# Patient Record
Sex: Female | Born: 1940 | Race: Black or African American | Hispanic: No | Smoking: Former smoker
Health system: Southern US, Community
[De-identification: ages and names within clinical notes are randomized; demographics above are authoritative.]

## PROBLEM LIST (undated history)

## (undated) DIAGNOSIS — H409 Unspecified glaucoma: Secondary | ICD-10-CM

## (undated) DIAGNOSIS — E559 Vitamin D deficiency, unspecified: Secondary | ICD-10-CM

## (undated) DIAGNOSIS — I10 Essential (primary) hypertension: Secondary | ICD-10-CM

## (undated) DIAGNOSIS — I4891 Unspecified atrial fibrillation: Secondary | ICD-10-CM

## (undated) HISTORY — DX: Vitamin D deficiency, unspecified: E55.9

## (undated) HISTORY — PX: CATARACT EXTRACTION: SUR2

## (undated) HISTORY — DX: Essential (primary) hypertension: I10

## (undated) HISTORY — PX: ABDOMINAL HYSTERECTOMY: SHX81

## (undated) HISTORY — PX: KIDNEY SURGERY: SHX687

## (undated) HISTORY — DX: Unspecified atrial fibrillation: I48.91

## (undated) HISTORY — PX: TONSILLECTOMY: SUR1361

---

## 1999-06-02 ENCOUNTER — Encounter: Payer: Self-pay | Admitting: *Deleted

## 1999-06-02 ENCOUNTER — Encounter: Admission: RE | Admit: 1999-06-02 | Discharge: 1999-06-02 | Payer: Self-pay | Admitting: *Deleted

## 2000-06-04 ENCOUNTER — Encounter: Payer: Self-pay | Admitting: *Deleted

## 2000-06-04 ENCOUNTER — Encounter: Admission: RE | Admit: 2000-06-04 | Discharge: 2000-06-04 | Payer: Self-pay | Admitting: *Deleted

## 2001-06-05 ENCOUNTER — Encounter: Payer: Self-pay | Admitting: *Deleted

## 2001-06-05 ENCOUNTER — Encounter: Admission: RE | Admit: 2001-06-05 | Discharge: 2001-06-05 | Payer: Self-pay | Admitting: *Deleted

## 2002-05-06 ENCOUNTER — Ambulatory Visit (HOSPITAL_COMMUNITY): Admission: RE | Admit: 2002-05-06 | Discharge: 2002-05-06 | Payer: Self-pay | Admitting: Gastroenterology

## 2002-06-08 ENCOUNTER — Encounter: Payer: Self-pay | Admitting: Internal Medicine

## 2002-06-08 ENCOUNTER — Encounter: Admission: RE | Admit: 2002-06-08 | Discharge: 2002-06-08 | Payer: Self-pay | Admitting: Internal Medicine

## 2003-06-10 ENCOUNTER — Encounter: Admission: RE | Admit: 2003-06-10 | Discharge: 2003-06-10 | Payer: Self-pay | Admitting: Internal Medicine

## 2004-07-04 ENCOUNTER — Encounter: Admission: RE | Admit: 2004-07-04 | Discharge: 2004-07-04 | Payer: Self-pay | Admitting: Internal Medicine

## 2005-07-16 ENCOUNTER — Encounter: Admission: RE | Admit: 2005-07-16 | Discharge: 2005-07-16 | Payer: Self-pay | Admitting: Internal Medicine

## 2006-07-17 ENCOUNTER — Encounter: Admission: RE | Admit: 2006-07-17 | Discharge: 2006-07-17 | Payer: Self-pay | Admitting: Internal Medicine

## 2007-07-21 ENCOUNTER — Encounter: Admission: RE | Admit: 2007-07-21 | Discharge: 2007-07-21 | Payer: Self-pay | Admitting: Internal Medicine

## 2007-07-29 ENCOUNTER — Encounter: Admission: RE | Admit: 2007-07-29 | Discharge: 2007-07-29 | Payer: Self-pay | Admitting: Internal Medicine

## 2007-12-23 ENCOUNTER — Inpatient Hospital Stay (HOSPITAL_COMMUNITY): Admission: EM | Admit: 2007-12-23 | Discharge: 2008-01-10 | Payer: Self-pay | Admitting: Emergency Medicine

## 2007-12-23 ENCOUNTER — Ambulatory Visit: Payer: Self-pay | Admitting: Pulmonary Disease

## 2007-12-29 ENCOUNTER — Encounter: Payer: Self-pay | Admitting: Pulmonary Disease

## 2008-03-08 ENCOUNTER — Encounter: Admission: RE | Admit: 2008-03-08 | Discharge: 2008-03-08 | Payer: Self-pay | Admitting: Neurosurgery

## 2008-07-21 ENCOUNTER — Encounter: Admission: RE | Admit: 2008-07-21 | Discharge: 2008-07-21 | Payer: Self-pay | Admitting: Internal Medicine

## 2009-07-27 ENCOUNTER — Encounter: Admission: RE | Admit: 2009-07-27 | Discharge: 2009-07-27 | Payer: Self-pay | Admitting: Internal Medicine

## 2010-07-08 ENCOUNTER — Other Ambulatory Visit: Payer: Self-pay | Admitting: Internal Medicine

## 2010-07-08 DIAGNOSIS — Z1231 Encounter for screening mammogram for malignant neoplasm of breast: Secondary | ICD-10-CM

## 2010-07-09 ENCOUNTER — Encounter: Payer: Self-pay | Admitting: Internal Medicine

## 2010-07-28 ENCOUNTER — Ambulatory Visit
Admission: RE | Admit: 2010-07-28 | Discharge: 2010-07-28 | Disposition: A | Discharge status: Home / Self Care | Payer: Federal, State, Local not specified - PPO | Source: Ambulatory Visit | Attending: Internal Medicine | Admitting: Internal Medicine

## 2010-07-28 DIAGNOSIS — Z1231 Encounter for screening mammogram for malignant neoplasm of breast: Secondary | ICD-10-CM

## 2010-10-31 NOTE — Consult Note (Signed)
NAME:  Kayla Calhoun, Kayla Calhoun NO.:  [id number]   MEDICAL RECORD NO.:  [id number]          PATIENT TYPE:  INP   LOCATION:  3037                         FACILITY:  [facility]   PHYSICIAN:  Lonia Blood, M.D.       DATE OF BIRTH:  1940-07-29   DATE OF CONSULTATION:  DATE OF DISCHARGE:                                 CONSULTATION   PRIMARY CARE PHYSICIAN:  Merlene Laughter. Renae Gloss, MD   REQUESTING PHYSICIAN FOR THE CONSULTATION:  Charlaine Dalton. Wert, MD, FCCP   REASON FOR CONSULTATION:  Atrial fibrillation, pulmonary edema, and  respiratory failure.   HISTORY OF PRESENT ILLNESS:  Kayla Calhoun is a 70 year old African-  American woman with past medical history of hypertension who was  admitted on December 23, 2007, with a subarachnoid hemorrhage.  The patient  underwent a four-vessel cerebral arteriogram, which did not indicate  presence of any aneurysm.  The patient was placed on intravenous fluids  and Nimotop to prevent vasospasm.  She developed respiratory failure and  she was placed in intensive care unit.  She became febrile and she had  the urine culture that grew Escherichia coli.  She was treated for  possible hospital-acquired pneumonia as well as volume overload and she  did gradually improve.  She also suffered an episode of atrial  fibrillation, for which she was loaded with amiodarone.  The patient  required endotracheal tube placement and mechanical ventilation starting  on December 30, 2007.  She was extubated on January 04, 2008, and since then,  she has been observed in the intensive care unit without any new events.  She was transferred to regular floor on January 07, 2008, and Dr. Sandrea Hughs contacted [place] this morning to assume her medical care.  Currently,  [name] denies any headaches, denies shortness of breath, and  denies any chest pain.  She is undergoing physical therapy and  occupational therapy for her post intensive care unit stay.   PAST MEDICAL HISTORY:  1.  Hypertension.  2. Hysterectomy.  3. Appendectomy.  4. Tonsillectomy.   CURRENT MEDICATIONS:  Nimotop, Zebeta, Betagan eye drops, Alphagan eye  drops, Travatan eye drops, Lantus 10 units twice a day, and NovoLog  sliding scale every 4 hours.  The patient was also on Solu-Medrol  intravenously until January 07, 2008.   SOCIAL HISTORY:  The patient lives alone and is completely independent  prior to this event.  She does not smoke cigarettes.  Does not drink  alcohol.  She is retired.   ALLERGIES:  No known drug allergies.   REVIEW OF SYSTEMS:  As per the HPI, also positive for this generalized  weakness, post acute ICU stay.   PHYSICAL EXAMINATION:  VITAL SIGNS:  Temperature is 98.7, heart rate 53,  respiratory rate 18, blood pressure 145/83, and saturation of 97% on  room air.  Morning CBG is 81.  HEENT:  The patient's head appears normocephalic and atraumatic.  Eyes,  pupils equal, round, and reactive to light and accommodation.  Extraocular movement is intact.  Throat clear.  NECK:  Supple.  There is a left  IJ catheter in place.  CHEST:  Clear to auscultation in the anterior fields.  HEART:  Regular, 3/6 systolic murmur in the left third intercostal  space.  ABDOMEN:  Soft and nontender.  Bowel sounds are present.  EXTREMITIES:  Lower extremities without edema.  SKIN:  Warm and dry without any suspicious-looking rashes.   LABORATORY VALUES:  Sodium of 141, potassium 3.9, chloride 100,  bicarbonate 33, BUN 20, creatinine 0.5, and glucose of 80.  White blood  cell count is 9, hemoglobin 9.5, and platelet count is 571.   Portable chest x-ray indicates improvement of diffuse airspace disease  and cardiomegaly.   IMPRESSION AND RECOMMENDATION:  1. Subarachnoid hemorrhage.  This will be managed by the primary      service.  The patient's head CT that was repeated on January 06, 2008,      indicates a complete resolution of the convexity of subarachnoid      hemorrhage and decrease in  the amount of the intraventricular      blood.  2. Respiratory failure.  This seems to have resolved nicely.  Ms.      Dupriest does not seem to be in any acute respiratory distress      currently. We will check saturations with ambulation and treat as      necessary. A follow up CXR has been ordered.  3. 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  A transthoracic echocardiogram      was obtained on December 29, 2007, indicated a preserved ejection      fraction, but with significant left ventricular hypertrophy, and      the Doppler evidence for mid dynamic left ventricular outflow tract      obstruction with a peak gradient of 27 mmHg.  I will try to stay      away from any direct vasodilators and to control the patient's      heart rate as much as possible.  4. Paroxysmal atrial fibrillation that was treated with amiodarone in      the intensive care unit.  The patient's amiodarone has been      discontinued yesterday by Dr. Sandrea Hughs.  We will keep the      patient on telemetry and monitor closely her heart rate.  5. Hypoglycemia, while the patient was in intensive care unit.  She      received steroids and tube feeds through a Panda tube.  She does      not carry a history of diabetes, but indeed she has obesity and      hypertension.  I will check a hemoglobin A1c and titrate down the      insulin aggressively as the steroids were discontinued yesterday      and the CBGs were trending down.  6. Deep vein thrombosis prophylaxis will be done using PAS hoses.      Lonia Blood, M.D.  Electronically Signed     SL/MEDQ  D:  01/08/2008  T:  01/08/2008  Job:  [id number]   cc:   Charlaine Dalton. Sherene Sires, MD, [facility]  Merlene Laughter. Renae Gloss, M.D.

## 2010-11-03 NOTE — Discharge Summary (Signed)
NAME:  KWANZA, CANCELLIERE            ACCOUNT NO.:  [id number]   MEDICAL RECORD NO.:  [id number]          PATIENT TYPE:  INP   LOCATION:  3037                         FACILITY:  [facility]   PHYSICIAN:  Reinaldo Meeker, M.D. DATE OF BIRTH:  Oct 19, 1940   DATE OF ADMISSION:  12/23/2007  DATE OF DISCHARGE:  01/10/2008                               DISCHARGE SUMMARY   PRIMARY DIAGNOSIS:  Subarachnoid hemorrhage.   PRIMARY OPERATIVE PROCEDURE:  Arteriography.   HISTORY:  Kayla Calhoun is a 70 year old female who was in her usual  state of health until the day of admission when she had a sudden onset  of severe headache.  She went to [facility] Emergency Room Department  where she underwent a CT scan of the brain, which was read as a  subarachnoid hemorrhage.  On our evaluation, it was noted to be mostly  intraventricular with no evidence of hydrocephalus.  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  The patient was therefore admitted at the ICU for  hypervolemic therapy along with observation.  She was treated with  aggressive fluid treatment and never developed any evidence of  vasospasm.  Subsequent scans showed no evidence of hydrocephalus.  She  did get some fluid overload and Critical Care Medicine got involved in  her care.  She actually had to be intubated for approximately 5 days  during which time she showed no worsening neurologically.  When she was  eventually able to be extubated, she improved quickly neurologically and  was actually able to begin increase in her activities.  She was  subsequently transferred to the floor where physical therapy and  occupational therapy began to work with her.  By January 06, 2008, she was  awake, alert, and appropriate and followed complex commands.  A brain  scan at that time showed complete resolution of her hemorrhage and no  hydrocephalus.  She was subsequently advanced on her activity  levels.  On January 10, 2008, she was sitting up comfortably, ambulating without  difficulty, and was felt that she will be discharged home.   Discharge medications include some pain medication with routine  medications.   Her condition was markedly improved versus admission.           ______________________________  Reinaldo Meeker, M.D.     ROK/MEDQ  D:  02/05/2008  T:  02/06/2008  Job:  [id number]

## 2010-11-03 NOTE — Op Note (Signed)
   NAME:  Kayla Calhoun, Kayla Calhoun                      ACCOUNT NO.:  [id number]   MEDICAL RECORD NO.:  [id number]                   PATIENT TYPE:  AMB   LOCATION:  ENDO                                 FACILITY:  [facility]   PHYSICIAN:  Anselmo Rod, M.D.               DATE OF BIRTH:  01/14/1941   DATE OF PROCEDURE:  05/06/2002  DATE OF DISCHARGE:                                 OPERATIVE REPORT   PROCEDURE:  Colonoscopy, endoscopy.   ENDOSCOPIST:  Anselmo Rod, M.D.   INSTRUMENT:  Olympus video colonoscope (adjustable pediatric scope).   INDICATIONS FOR PROCEDURE:  Rectal bleeding in a 69 year old African-  American female. To rule out any colonic polyps, masses, hemorrhoids, etc.   PRE-PROCEDURE PREPARATION:  Informed consent was procured from the patient.  Patient fasted for 8 hours prior to the procedure and prepped with a bottle  of magnesium citrate and a gallon of NuLytely the night prior to the  procedure.  Pre-procedure physical:  Patient had stable vital signs, neck  supple, chest clear to auscultation, respirations regular, abdomen soft with  normal bowel sounds.   DESCRIPTION OF PROCEDURE:  The patient was placed in the left lateral  decubitus position, sedated with 100 mg of Demerol and 10 mg of Versed  intravenously.  Once the patient was adequately sedated and maintained on  low flow oxygen, continuous cardiac monitoring; the Olympus video  colonoscope was advanced from the rectum to the cecum with difficulty. There  was a large amount of residual stool in the colon. Multiple washings were  done.  The entire colonic mucosa up to the terminal ileum appeared healthy  except for scattered diverticulosis. There were some very large diverticula  seen in the right colon, small lesions could have been missed.  No large  masses or polyps were seen.   IMPRESSION:  1. Scattered diverticulosis.  2. Large amount of residual stool in the colon.  Small lesions could have     been  missed.  3. No masses or polyps seen.    RECOMMENDATIONS:  1. A high fiber diet has been recommended to the patient.  2. Outpatient follow up in the next 2 weeks with further recommendations.                                                   Anselmo Rod, M.D.    JNM/MEDQ  D:  05/06/2002  T:  05/06/2002  Job:  [id number]   cc:   Merlene Laughter. Renae Gloss, M.D.

## 2011-03-15 LAB — BLOOD GAS, ARTERIAL
Acid-Base Excess: 0.7
Acid-Base Excess: 2.2 — ABNORMAL HIGH
Acid-Base Excess: 3.5 — ABNORMAL HIGH
Acid-Base Excess: 5 — ABNORMAL HIGH
Acid-Base Excess: 5.1 — ABNORMAL HIGH
Bicarbonate: 24.4 — ABNORMAL HIGH
Bicarbonate: 25.9 — ABNORMAL HIGH
Bicarbonate: 27.1 — ABNORMAL HIGH
Bicarbonate: 27.7 — ABNORMAL HIGH
Bicarbonate: 27.9 — ABNORMAL HIGH
Delivery systems: POSITIVE
Delivery systems: POSITIVE
Drawn by: 129801
Drawn by: 27733
Drawn by: 27733
Drawn by: 29943
FIO2: 0.5
FIO2: 0.6
FIO2: 1
FIO2: 1
FIO2: 1
MECHVT: 400
O2 Saturation: 95
O2 Saturation: 95.5
O2 Saturation: 96
O2 Saturation: 98.5
O2 Saturation: 98.8
PEEP: 5
PEEP: 8
PEEP: 8
Patient temperature: 100
Patient temperature: 103.1
Patient temperature: 98.6
Patient temperature: 98.6
Patient temperature: 99.1
Pressure support: 14
Pressure support: 14
RATE: 20
TCO2: 25.5
TCO2: 27.1
TCO2: 28.3
TCO2: 28.7
TCO2: 28.9
pCO2 arterial: 33.2 — ABNORMAL LOW
pCO2 arterial: 36.2
pCO2 arterial: 36.9
pCO2 arterial: 38.6
pCO2 arterial: 39.1
pH, Arterial: 7.438 — ABNORMAL HIGH
pH, Arterial: 7.443 — ABNORMAL HIGH
pH, Arterial: 7.46 — ABNORMAL HIGH
pH, Arterial: 7.501 — ABNORMAL HIGH
pH, Arterial: 7.535 — ABNORMAL HIGH
pO2, Arterial: 109 — ABNORMAL HIGH
pO2, Arterial: 152 — ABNORMAL HIGH
pO2, Arterial: 71.1 — ABNORMAL LOW
pO2, Arterial: 74.2 — ABNORMAL LOW
pO2, Arterial: 79.9 — ABNORMAL LOW

## 2011-03-15 LAB — BASIC METABOLIC PANEL
BUN: 10
BUN: 12
BUN: 16
BUN: 18
BUN: 7
CO2: 23
CO2: 26
CO2: 26
CO2: 27
CO2: 27
Calcium: 7 — ABNORMAL LOW
Calcium: 7.1 — ABNORMAL LOW
Calcium: 7.2 — ABNORMAL LOW
Calcium: 9.2
Calcium: 9.3
Chloride: 100
Chloride: 100
Chloride: 102
Chloride: 98
Chloride: 99
Creatinine, Ser: 0.62
Creatinine, Ser: 0.68
Creatinine, Ser: 0.69
Creatinine, Ser: 0.72
Creatinine, Ser: 0.81
GFR calc Af Amer: >60
GFR calc Af Amer: >60
GFR calc Af Amer: >60
GFR calc Af Amer: >60
GFR calc Af Amer: >60
GFR calc non Af Amer: >60
GFR calc non Af Amer: >60
GFR calc non Af Amer: >60
GFR calc non Af Amer: >60
GFR calc non Af Amer: >60
Glucose, Bld: 108 — ABNORMAL HIGH
Glucose, Bld: 134 — ABNORMAL HIGH
Glucose, Bld: 156 — ABNORMAL HIGH
Glucose, Bld: 176 — ABNORMAL HIGH
Glucose, Bld: 180 — ABNORMAL HIGH
Potassium: 3.3 — ABNORMAL LOW
Potassium: 3.4 — ABNORMAL LOW
Potassium: 3.5
Potassium: 3.7
Potassium: 6 — ABNORMAL HIGH
Sodium: 130 — ABNORMAL LOW
Sodium: 131 — ABNORMAL LOW
Sodium: 132 — ABNORMAL LOW
Sodium: 136
Sodium: 137

## 2011-03-15 LAB — CBC
HCT: 28.3 — ABNORMAL LOW
HCT: 30.2 — ABNORMAL LOW
HCT: 32.4 — ABNORMAL LOW
HCT: 39.8
HCT: 41.1
Hemoglobin: 10 — ABNORMAL LOW
Hemoglobin: 10.8 — ABNORMAL LOW
Hemoglobin: 13.6
Hemoglobin: 13.6
Hemoglobin: 9.8 — ABNORMAL LOW
MCHC: 33.1
MCHC: 33.1
MCHC: 33.4
MCHC: 34
MCHC: 34.5
MCV: 87.4
MCV: 87.5
MCV: 87.9
MCV: 88.1
MCV: 89.1
Platelets: 221
Platelets: 235
Platelets: 245
Platelets: 283
Platelets: 305
RBC: 3.22 — ABNORMAL LOW
RBC: 3.39 — ABNORMAL LOW
RBC: 3.68 — ABNORMAL LOW
RBC: 4.56
RBC: 4.69
RDW: 14.6
RDW: 14.7
RDW: 15.1
RDW: 15.3
RDW: 16 — ABNORMAL HIGH
WBC: 11.3 — ABNORMAL HIGH
WBC: 12.5 — ABNORMAL HIGH
WBC: 14.3 — ABNORMAL HIGH
WBC: 15.8 — ABNORMAL HIGH
WBC: 9.2

## 2011-03-15 LAB — DIFFERENTIAL
Basophils Absolute: 0
Basophils Absolute: 0
Basophils Relative: 0
Basophils Relative: 0
Eosinophils Absolute: 0
Eosinophils Absolute: 0.1
Eosinophils Relative: 0
Eosinophils Relative: 1
Lymphocytes Relative: 11 — ABNORMAL LOW
Lymphocytes Relative: 23
Lymphs Abs: 1.4
Lymphs Abs: 2.1
Monocytes Absolute: 0.3
Monocytes Absolute: 0.5
Monocytes Relative: 3
Monocytes Relative: 4
Neutro Abs: 10.6 — ABNORMAL HIGH
Neutro Abs: 6.7
Neutrophils Relative %: 73
Neutrophils Relative %: 85 — ABNORMAL HIGH

## 2011-03-15 LAB — COMPREHENSIVE METABOLIC PANEL
ALT: 17
AST: 19
Albumin: 2 — ABNORMAL LOW
Alkaline Phosphatase: 50
BUN: 16
CO2: 26
Calcium: 7.6 — ABNORMAL LOW
Chloride: 102
Creatinine, Ser: 0.68
GFR calc Af Amer: >60
GFR calc non Af Amer: >60
Glucose, Bld: 98
Potassium: 3.5
Sodium: 136
Total Bilirubin: 1.5 — ABNORMAL HIGH
Total Protein: 4.1 — ABNORMAL LOW

## 2011-03-15 LAB — CARDIAC PANEL(CRET KIN+CKTOT+MB+TROPI)
CK, MB: 0.5
CK, MB: 1
CK, MB: 1.2
CK, MB: 1.2
Relative Index: 0.9
Relative Index: INVALID
Relative Index: INVALID
Relative Index: INVALID
Total CK: 132
Total CK: 34
Total CK: 45
Total CK: 58
Troponin I: 0.01
Troponin I: 0.03
Troponin I: 0.04
Troponin I: 0.07 — ABNORMAL HIGH

## 2011-03-15 LAB — URINALYSIS, ROUTINE W REFLEX MICROSCOPIC
Bilirubin Urine: NEGATIVE
Glucose, UA: NEGATIVE
Hgb urine dipstick: NEGATIVE
Ketones, ur: NEGATIVE
Leukocytes, UA: NEGATIVE
Nitrite: NEGATIVE
Protein, ur: 100 — AB
Specific Gravity, Urine: 1.012
Urobilinogen, UA: 0.2
pH: 7.5

## 2011-03-15 LAB — URINE CULTURE
Colony Count: >=100000
Special Requests: NEGATIVE

## 2011-03-15 LAB — CULTURE, BLOOD (ROUTINE X 2)
Culture: NO GROWTH
Culture: NO GROWTH

## 2011-03-15 LAB — HEPATIC FUNCTION PANEL
ALT: 18
AST: 19
Albumin: 2.1 — ABNORMAL LOW
Alkaline Phosphatase: 41
Bilirubin, Direct: 0.4 — ABNORMAL HIGH
Indirect Bilirubin: 0.6
Total Bilirubin: 1
Total Protein: 4.1 — ABNORMAL LOW

## 2011-03-15 LAB — CULTURE, RESPIRATORY W GRAM STAIN: Culture: NORMAL

## 2011-03-15 LAB — PROTIME-INR
INR: 0.9
Prothrombin Time: 12.6

## 2011-03-15 LAB — URINE MICROSCOPIC-ADD ON

## 2011-03-15 LAB — B-NATRIURETIC PEPTIDE (CONVERTED LAB): Pro B Natriuretic peptide (BNP): 202 — ABNORMAL HIGH

## 2011-03-15 LAB — EXPECTORATED SPUTUM ASSESSMENT W GRAM STAIN, RFLX TO RESP C

## 2011-03-15 LAB — MAGNESIUM: Magnesium: 1.9

## 2011-03-15 LAB — APTT: aPTT: 24

## 2011-03-15 LAB — CULTURE, RESPIRATORY

## 2011-03-15 LAB — EXPECTORATED SPUTUM ASSESSMENT W REFEX TO RESP CULTURE

## 2011-03-15 LAB — SEDIMENTATION RATE: Sed Rate: 90 — ABNORMAL HIGH

## 2011-03-16 LAB — CBC
HCT: 26.2 — ABNORMAL LOW
HCT: 27.6 — ABNORMAL LOW
HCT: 27.8 — ABNORMAL LOW
HCT: 28.5 — ABNORMAL LOW
HCT: 28.9 — ABNORMAL LOW
HCT: 29 — ABNORMAL LOW
HCT: 29.3 — ABNORMAL LOW
HCT: 29.4 — ABNORMAL LOW
HCT: 29.5 — ABNORMAL LOW
Hemoglobin: 10 — ABNORMAL LOW
Hemoglobin: 8.9 — ABNORMAL LOW
Hemoglobin: 9.2 — ABNORMAL LOW
Hemoglobin: 9.3 — ABNORMAL LOW
Hemoglobin: 9.4 — ABNORMAL LOW
Hemoglobin: 9.5 — ABNORMAL LOW
Hemoglobin: 9.5 — ABNORMAL LOW
Hemoglobin: 9.7 — ABNORMAL LOW
Hemoglobin: 9.7 — ABNORMAL LOW
MCHC: 32.1
MCHC: 32.4
MCHC: 33.1
MCHC: 33.3
MCHC: 33.3
MCHC: 33.3
MCHC: 33.6
MCHC: 34.1
MCHC: 34.2
MCV: 87.9
MCV: 88
MCV: 88
MCV: 88.1
MCV: 88.4
MCV: 88.5
MCV: 89
MCV: 89.1
MCV: 89.4
Platelets: 295
Platelets: 378
Platelets: 379
Platelets: 391
Platelets: 474 — ABNORMAL HIGH
Platelets: 513 — ABNORMAL HIGH
Platelets: 517 — ABNORMAL HIGH
Platelets: 543 — ABNORMAL HIGH
Platelets: 571 — ABNORMAL HIGH
RBC: 2.96 — ABNORMAL LOW
RBC: 3.14 — ABNORMAL LOW
RBC: 3.16 — ABNORMAL LOW
RBC: 3.23 — ABNORMAL LOW
RBC: 3.25 — ABNORMAL LOW
RBC: 3.27 — ABNORMAL LOW
RBC: 3.3 — ABNORMAL LOW
RBC: 3.3 — ABNORMAL LOW
RBC: 3.33 — ABNORMAL LOW
RDW: 14.7
RDW: 15
RDW: 15
RDW: 15.1
RDW: 15.3
RDW: 15.3
RDW: 15.4
RDW: 15.5
RDW: 15.6 — ABNORMAL HIGH
WBC: 11 — ABNORMAL HIGH
WBC: 11.6 — ABNORMAL HIGH
WBC: 13.1 — ABNORMAL HIGH
WBC: 13.9 — ABNORMAL HIGH
WBC: 13.9 — ABNORMAL HIGH
WBC: 14.3 — ABNORMAL HIGH
WBC: 17.1 — ABNORMAL HIGH
WBC: 7.9
WBC: 9

## 2011-03-16 LAB — BASIC METABOLIC PANEL
BUN: 10
BUN: 20
BUN: 24 — ABNORMAL HIGH
BUN: 29 — ABNORMAL HIGH
BUN: 9
BUN: 9
CO2: 29
CO2: 29
CO2: 31
CO2: 33 — ABNORMAL HIGH
CO2: 33 — ABNORMAL HIGH
CO2: 35 — ABNORMAL HIGH
Calcium: 7.7 — ABNORMAL LOW
Calcium: 7.8 — ABNORMAL LOW
Calcium: 8.1 — ABNORMAL LOW
Calcium: 8.6
Calcium: 8.8
Calcium: 9
Chloride: 100
Chloride: 101
Chloride: 101
Chloride: 101
Chloride: 103
Chloride: 99
Creatinine, Ser: 0.43
Creatinine, Ser: 0.5
Creatinine, Ser: 0.53
Creatinine, Ser: 0.55
Creatinine, Ser: 0.56
Creatinine, Ser: 0.59
GFR calc Af Amer: >60
GFR calc Af Amer: >60
GFR calc Af Amer: >60
GFR calc Af Amer: >60
GFR calc Af Amer: >60
GFR calc Af Amer: >60
GFR calc non Af Amer: >60
GFR calc non Af Amer: >60
GFR calc non Af Amer: >60
GFR calc non Af Amer: >60
GFR calc non Af Amer: >60
GFR calc non Af Amer: >60
Glucose, Bld: 121 — ABNORMAL HIGH
Glucose, Bld: 124 — ABNORMAL HIGH
Glucose, Bld: 131 — ABNORMAL HIGH
Glucose, Bld: 152 — ABNORMAL HIGH
Glucose, Bld: 173 — ABNORMAL HIGH
Glucose, Bld: 80
Potassium: 3.1 — ABNORMAL LOW
Potassium: 3.3 — ABNORMAL LOW
Potassium: 3.4 — ABNORMAL LOW
Potassium: 3.8
Potassium: 3.9
Potassium: 4.2
Sodium: 136
Sodium: 138
Sodium: 138
Sodium: 138
Sodium: 141
Sodium: 142

## 2011-03-16 LAB — BLOOD GAS, ARTERIAL
Acid-Base Excess: 0.9
Acid-Base Excess: 10.4 — ABNORMAL HIGH
Acid-Base Excess: 4.1 — ABNORMAL HIGH
Acid-Base Excess: 4.6 — ABNORMAL HIGH
Acid-Base Excess: 6.2 — ABNORMAL HIGH
Acid-Base Excess: 8.2 — ABNORMAL HIGH
Bicarbonate: 24.9 — ABNORMAL HIGH
Bicarbonate: 28.4 — ABNORMAL HIGH
Bicarbonate: 29.7 — ABNORMAL HIGH
Bicarbonate: 30.5 — ABNORMAL HIGH
Bicarbonate: 32.4 — ABNORMAL HIGH
Bicarbonate: 34.7 — ABNORMAL HIGH
Delivery systems: POSITIVE
Drawn by: 24486
Drawn by: 30599
FIO2: 0.28
FIO2: 0.4
FIO2: 0.4
FIO2: 0.5
FIO2: 0.6
FIO2: 100
MECHVT: 300
MECHVT: 300
MECHVT: 300
Mode: POSITIVE
O2 Saturation: 87.4
O2 Saturation: 91.9
O2 Saturation: 92.9
O2 Saturation: 94.3
O2 Saturation: 96.5
O2 Saturation: 97.7
PEEP: 5
PEEP: 5
PEEP: 5
PEEP: 6
PEEP: 8
Patient temperature: 100.3
Patient temperature: 98.6
Patient temperature: 98.6
Patient temperature: 98.9
Patient temperature: 99.7
Patient temperature: 99.8
Pressure support: 12
Pressure support: 6
RATE: 24
RATE: 24
RATE: 24
TCO2: 26.1
TCO2: 29.7
TCO2: 31.3
TCO2: 31.9
TCO2: 33.8
TCO2: 36.2
pCO2 arterial: 41.1
pCO2 arterial: 44.6
pCO2 arterial: 48 — ABNORMAL HIGH
pCO2 arterial: 48.1 — ABNORMAL HIGH
pCO2 arterial: 48.7 — ABNORMAL HIGH
pCO2 arterial: 53.4 — ABNORMAL HIGH
pH, Arterial: 7.364
pH, Arterial: 7.406 — ABNORMAL HIGH
pH, Arterial: 7.42 — ABNORMAL HIGH
pH, Arterial: 7.422 — ABNORMAL HIGH
pH, Arterial: 7.447 — ABNORMAL HIGH
pH, Arterial: 7.466 — ABNORMAL HIGH
pO2, Arterial: 53.6 — ABNORMAL LOW
pO2, Arterial: 66.3 — ABNORMAL LOW
pO2, Arterial: 67.6 — ABNORMAL LOW
pO2, Arterial: 76.1 — ABNORMAL LOW
pO2, Arterial: 88.1
pO2, Arterial: 95.8

## 2011-03-16 LAB — COMPREHENSIVE METABOLIC PANEL
ALT: 20
ALT: 35
ALT: 37 — ABNORMAL HIGH
ALT: 48 — ABNORMAL HIGH
AST: 20
AST: 22
AST: 25
AST: 26
Albumin: 1.9 — ABNORMAL LOW
Albumin: 2 — ABNORMAL LOW
Albumin: 2.3 — ABNORMAL LOW
Albumin: 2.3 — ABNORMAL LOW
Alkaline Phosphatase: 53
Alkaline Phosphatase: 54
Alkaline Phosphatase: 56
Alkaline Phosphatase: 61
BUN: 11
BUN: 14
BUN: 15
BUN: 17
CO2: 25
CO2: 27
CO2: 31
CO2: 31
Calcium: 7.3 — ABNORMAL LOW
Calcium: 7.5 — ABNORMAL LOW
Calcium: 8.7
Calcium: 8.8
Chloride: 100
Chloride: 100
Chloride: 106
Chloride: 108
Creatinine, Ser: 0.51
Creatinine, Ser: 0.56
Creatinine, Ser: 0.57
Creatinine, Ser: 0.61
GFR calc Af Amer: >60
GFR calc Af Amer: >60
GFR calc Af Amer: >60
GFR calc Af Amer: >60
GFR calc non Af Amer: >60
GFR calc non Af Amer: >60
GFR calc non Af Amer: >60
GFR calc non Af Amer: >60
Glucose, Bld: 102 — ABNORMAL HIGH
Glucose, Bld: 102 — ABNORMAL HIGH
Glucose, Bld: 123 — ABNORMAL HIGH
Glucose, Bld: 193 — ABNORMAL HIGH
Potassium: 3.2 — ABNORMAL LOW
Potassium: 3.6
Potassium: 3.9
Potassium: 3.9
Sodium: 137
Sodium: 138
Sodium: 139
Sodium: 140
Total Bilirubin: 0.6
Total Bilirubin: 0.6
Total Bilirubin: 0.9
Total Bilirubin: 1
Total Protein: 4.4 — ABNORMAL LOW
Total Protein: 4.5 — ABNORMAL LOW
Total Protein: 4.9 — ABNORMAL LOW
Total Protein: 5.4 — ABNORMAL LOW

## 2011-03-16 LAB — RETICULOCYTES
RBC.: 3.42 — ABNORMAL LOW
Retic Count, Absolute: 41
Retic Ct Pct: 1.2

## 2011-03-16 LAB — LACTIC ACID, PLASMA: Lactic Acid, Venous: 1

## 2011-03-16 LAB — SEDIMENTATION RATE
Sed Rate: 48 — ABNORMAL HIGH
Sed Rate: 50 — ABNORMAL HIGH
Sed Rate: 65 — ABNORMAL HIGH
Sed Rate: 66 — ABNORMAL HIGH

## 2011-03-16 LAB — IRON AND TIBC
Iron: 21 — ABNORMAL LOW
Saturation Ratios: 13 — ABNORMAL LOW
TIBC: 159 — ABNORMAL LOW
UIBC: 138

## 2011-03-16 LAB — VITAMIN B12: Vitamin B-12: 1316 — ABNORMAL HIGH (ref 211–911)

## 2011-03-16 LAB — B-NATRIURETIC PEPTIDE (CONVERTED LAB)
Pro B Natriuretic peptide (BNP): 183 — ABNORMAL HIGH
Pro B Natriuretic peptide (BNP): 272 — ABNORMAL HIGH
Pro B Natriuretic peptide (BNP): 330 — ABNORMAL HIGH

## 2011-03-16 LAB — HEMOGLOBIN A1C
Hgb A1c MFr Bld: 6.5 — ABNORMAL HIGH
Mean Plasma Glucose: 154

## 2011-03-16 LAB — ANA: Anti Nuclear Antibody(ANA): NEGATIVE

## 2011-03-16 LAB — RHEUMATOID FACTOR: Rhuematoid fact SerPl-aCnc: <20

## 2011-03-16 LAB — FERRITIN: Ferritin: 309 — ABNORMAL HIGH (ref 10–291)

## 2011-03-16 LAB — MAGNESIUM: Magnesium: 2.3

## 2011-03-16 LAB — TSH: TSH: 2.119

## 2011-03-16 LAB — FOLATE: Folate: 3.7

## 2011-03-16 LAB — ANGIOTENSIN CONVERTING ENZYME: Angiotensin-Converting Enzyme: 36 U/L (ref 9–67)

## 2011-07-23 ENCOUNTER — Other Ambulatory Visit: Payer: Self-pay | Admitting: Internal Medicine

## 2011-07-23 DIAGNOSIS — Z1231 Encounter for screening mammogram for malignant neoplasm of breast: Secondary | ICD-10-CM

## 2011-07-30 ENCOUNTER — Ambulatory Visit
Admission: RE | Admit: 2011-07-30 | Discharge: 2011-07-30 | Disposition: A | Discharge status: Home / Self Care | Payer: Medicare Other | Source: Ambulatory Visit | Attending: Internal Medicine | Admitting: Internal Medicine

## 2011-07-30 DIAGNOSIS — Z1231 Encounter for screening mammogram for malignant neoplasm of breast: Secondary | ICD-10-CM

## 2011-10-05 DIAGNOSIS — H4011X0 Primary open-angle glaucoma, stage unspecified: Secondary | ICD-10-CM | POA: Diagnosis not present

## 2011-12-13 DIAGNOSIS — Z79899 Other long term (current) drug therapy: Secondary | ICD-10-CM | POA: Diagnosis not present

## 2011-12-13 DIAGNOSIS — R7309 Other abnormal glucose: Secondary | ICD-10-CM | POA: Diagnosis not present

## 2011-12-13 DIAGNOSIS — M545 Low back pain, unspecified: Secondary | ICD-10-CM | POA: Diagnosis not present

## 2011-12-13 DIAGNOSIS — I10 Essential (primary) hypertension: Secondary | ICD-10-CM | POA: Diagnosis not present

## 2012-01-14 ENCOUNTER — Other Ambulatory Visit: Payer: Self-pay | Admitting: Neurosurgery

## 2012-01-14 DIAGNOSIS — R519 Headache, unspecified: Secondary | ICD-10-CM

## 2012-01-16 ENCOUNTER — Ambulatory Visit
Admission: RE | Admit: 2012-01-16 | Discharge: 2012-01-16 | Disposition: A | Discharge status: Home / Self Care | Payer: Medicare Other | Source: Ambulatory Visit | Attending: Neurosurgery | Admitting: Neurosurgery

## 2012-01-16 DIAGNOSIS — I6200 Nontraumatic subdural hemorrhage, unspecified: Secondary | ICD-10-CM | POA: Diagnosis not present

## 2012-01-16 DIAGNOSIS — R519 Headache, unspecified: Secondary | ICD-10-CM

## 2012-01-16 DIAGNOSIS — R51 Headache: Secondary | ICD-10-CM | POA: Diagnosis not present

## 2012-01-21 DIAGNOSIS — R51 Headache: Secondary | ICD-10-CM | POA: Diagnosis not present

## 2012-04-02 DIAGNOSIS — H4011X0 Primary open-angle glaucoma, stage unspecified: Secondary | ICD-10-CM | POA: Diagnosis not present

## 2012-04-02 DIAGNOSIS — E119 Type 2 diabetes mellitus without complications: Secondary | ICD-10-CM | POA: Diagnosis not present

## 2012-04-02 DIAGNOSIS — H409 Unspecified glaucoma: Secondary | ICD-10-CM | POA: Diagnosis not present

## 2012-06-30 DIAGNOSIS — R7309 Other abnormal glucose: Secondary | ICD-10-CM | POA: Diagnosis not present

## 2012-06-30 DIAGNOSIS — I10 Essential (primary) hypertension: Secondary | ICD-10-CM | POA: Diagnosis not present

## 2012-06-30 DIAGNOSIS — E559 Vitamin D deficiency, unspecified: Secondary | ICD-10-CM | POA: Diagnosis not present

## 2012-06-30 DIAGNOSIS — Z23 Encounter for immunization: Secondary | ICD-10-CM | POA: Diagnosis not present

## 2012-06-30 DIAGNOSIS — Z0000 Encounter for general adult medical examination without abnormal findings: Secondary | ICD-10-CM | POA: Diagnosis not present

## 2012-06-30 DIAGNOSIS — Z79899 Other long term (current) drug therapy: Secondary | ICD-10-CM | POA: Diagnosis not present

## 2012-09-10 ENCOUNTER — Other Ambulatory Visit: Payer: Self-pay

## 2012-09-10 DIAGNOSIS — Z1231 Encounter for screening mammogram for malignant neoplasm of breast: Secondary | ICD-10-CM

## 2012-09-25 ENCOUNTER — Ambulatory Visit
Admission: RE | Admit: 2012-09-25 | Discharge: 2012-09-25 | Disposition: A | Discharge status: Home / Self Care | Payer: Medicare Other | Source: Ambulatory Visit

## 2012-09-25 DIAGNOSIS — Z1231 Encounter for screening mammogram for malignant neoplasm of breast: Secondary | ICD-10-CM | POA: Diagnosis not present

## 2012-09-29 DIAGNOSIS — H409 Unspecified glaucoma: Secondary | ICD-10-CM | POA: Diagnosis not present

## 2012-09-29 DIAGNOSIS — H4011X0 Primary open-angle glaucoma, stage unspecified: Secondary | ICD-10-CM | POA: Diagnosis not present

## 2012-12-29 DIAGNOSIS — Z23 Encounter for immunization: Secondary | ICD-10-CM | POA: Diagnosis not present

## 2012-12-29 DIAGNOSIS — Z79899 Other long term (current) drug therapy: Secondary | ICD-10-CM | POA: Diagnosis not present

## 2012-12-29 DIAGNOSIS — R7309 Other abnormal glucose: Secondary | ICD-10-CM | POA: Diagnosis not present

## 2012-12-29 DIAGNOSIS — E559 Vitamin D deficiency, unspecified: Secondary | ICD-10-CM | POA: Diagnosis not present

## 2012-12-29 DIAGNOSIS — I10 Essential (primary) hypertension: Secondary | ICD-10-CM | POA: Diagnosis not present

## 2012-12-29 DIAGNOSIS — Z0000 Encounter for general adult medical examination without abnormal findings: Secondary | ICD-10-CM | POA: Diagnosis not present

## 2013-01-19 DIAGNOSIS — M25579 Pain in unspecified ankle and joints of unspecified foot: Secondary | ICD-10-CM | POA: Diagnosis not present

## 2013-01-19 DIAGNOSIS — M722 Plantar fascial fibromatosis: Secondary | ICD-10-CM | POA: Diagnosis not present

## 2013-02-02 DIAGNOSIS — M722 Plantar fascial fibromatosis: Secondary | ICD-10-CM | POA: Diagnosis not present

## 2013-02-02 DIAGNOSIS — M25579 Pain in unspecified ankle and joints of unspecified foot: Secondary | ICD-10-CM | POA: Diagnosis not present

## 2013-03-31 DIAGNOSIS — H409 Unspecified glaucoma: Secondary | ICD-10-CM | POA: Diagnosis not present

## 2013-03-31 DIAGNOSIS — H4011X0 Primary open-angle glaucoma, stage unspecified: Secondary | ICD-10-CM | POA: Diagnosis not present

## 2013-06-30 DIAGNOSIS — R7309 Other abnormal glucose: Secondary | ICD-10-CM | POA: Diagnosis not present

## 2013-06-30 DIAGNOSIS — Z0000 Encounter for general adult medical examination without abnormal findings: Secondary | ICD-10-CM | POA: Diagnosis not present

## 2013-06-30 DIAGNOSIS — Z79899 Other long term (current) drug therapy: Secondary | ICD-10-CM | POA: Diagnosis not present

## 2013-06-30 DIAGNOSIS — E559 Vitamin D deficiency, unspecified: Secondary | ICD-10-CM | POA: Diagnosis not present

## 2013-06-30 DIAGNOSIS — I10 Essential (primary) hypertension: Secondary | ICD-10-CM | POA: Diagnosis not present

## 2013-07-07 DIAGNOSIS — Z0000 Encounter for general adult medical examination without abnormal findings: Secondary | ICD-10-CM | POA: Diagnosis not present

## 2013-07-07 DIAGNOSIS — R7309 Other abnormal glucose: Secondary | ICD-10-CM | POA: Diagnosis not present

## 2013-07-07 DIAGNOSIS — E559 Vitamin D deficiency, unspecified: Secondary | ICD-10-CM | POA: Diagnosis not present

## 2013-07-07 DIAGNOSIS — I10 Essential (primary) hypertension: Secondary | ICD-10-CM | POA: Diagnosis not present

## 2013-09-28 DIAGNOSIS — H409 Unspecified glaucoma: Secondary | ICD-10-CM | POA: Diagnosis not present

## 2013-09-28 DIAGNOSIS — H4011X0 Primary open-angle glaucoma, stage unspecified: Secondary | ICD-10-CM | POA: Diagnosis not present

## 2013-11-03 ENCOUNTER — Other Ambulatory Visit: Payer: Self-pay

## 2013-11-03 DIAGNOSIS — Z1231 Encounter for screening mammogram for malignant neoplasm of breast: Secondary | ICD-10-CM

## 2013-11-17 ENCOUNTER — Encounter (INDEPENDENT_AMBULATORY_CARE_PROVIDER_SITE_OTHER): Payer: Self-pay

## 2013-11-17 ENCOUNTER — Ambulatory Visit
Admission: RE | Admit: 2013-11-17 | Discharge: 2013-11-17 | Disposition: A | Discharge status: Home / Self Care | Payer: Medicare Other | Source: Ambulatory Visit

## 2013-11-17 DIAGNOSIS — Z1231 Encounter for screening mammogram for malignant neoplasm of breast: Secondary | ICD-10-CM

## 2013-11-18 ENCOUNTER — Other Ambulatory Visit: Payer: Self-pay | Admitting: Internal Medicine

## 2013-11-18 DIAGNOSIS — R928 Other abnormal and inconclusive findings on diagnostic imaging of breast: Secondary | ICD-10-CM

## 2013-11-25 DIAGNOSIS — J069 Acute upper respiratory infection, unspecified: Secondary | ICD-10-CM | POA: Diagnosis not present

## 2013-11-26 ENCOUNTER — Ambulatory Visit
Admission: RE | Admit: 2013-11-26 | Discharge: 2013-11-26 | Disposition: A | Discharge status: Home / Self Care | Payer: Medicare Other | Source: Ambulatory Visit | Attending: Internal Medicine | Admitting: Internal Medicine

## 2013-11-26 DIAGNOSIS — R928 Other abnormal and inconclusive findings on diagnostic imaging of breast: Secondary | ICD-10-CM

## 2014-01-08 DIAGNOSIS — I10 Essential (primary) hypertension: Secondary | ICD-10-CM | POA: Diagnosis not present

## 2014-01-08 DIAGNOSIS — Z79899 Other long term (current) drug therapy: Secondary | ICD-10-CM | POA: Diagnosis not present

## 2014-01-21 DIAGNOSIS — N959 Unspecified menopausal and perimenopausal disorder: Secondary | ICD-10-CM | POA: Diagnosis not present

## 2014-01-26 DIAGNOSIS — H409 Unspecified glaucoma: Secondary | ICD-10-CM | POA: Diagnosis not present

## 2014-01-26 DIAGNOSIS — H4011X0 Primary open-angle glaucoma, stage unspecified: Secondary | ICD-10-CM | POA: Diagnosis not present

## 2014-04-21 DIAGNOSIS — I10 Essential (primary) hypertension: Secondary | ICD-10-CM | POA: Diagnosis not present

## 2014-04-21 DIAGNOSIS — Z79899 Other long term (current) drug therapy: Secondary | ICD-10-CM | POA: Diagnosis not present

## 2014-04-21 DIAGNOSIS — R05 Cough: Secondary | ICD-10-CM | POA: Diagnosis not present

## 2014-04-21 DIAGNOSIS — J309 Allergic rhinitis, unspecified: Secondary | ICD-10-CM | POA: Diagnosis not present

## 2014-07-05 DIAGNOSIS — H903 Sensorineural hearing loss, bilateral: Secondary | ICD-10-CM | POA: Diagnosis not present

## 2014-07-14 DIAGNOSIS — Z6841 Body Mass Index (BMI) 40.0 and over, adult: Secondary | ICD-10-CM | POA: Diagnosis not present

## 2014-07-14 DIAGNOSIS — Z0000 Encounter for general adult medical examination without abnormal findings: Secondary | ICD-10-CM | POA: Diagnosis not present

## 2014-07-14 DIAGNOSIS — I10 Essential (primary) hypertension: Secondary | ICD-10-CM | POA: Diagnosis not present

## 2014-07-14 DIAGNOSIS — Z79899 Other long term (current) drug therapy: Secondary | ICD-10-CM | POA: Diagnosis not present

## 2014-07-14 DIAGNOSIS — E668 Other obesity: Secondary | ICD-10-CM | POA: Diagnosis not present

## 2014-07-27 DIAGNOSIS — R7309 Other abnormal glucose: Secondary | ICD-10-CM | POA: Diagnosis not present

## 2014-07-27 DIAGNOSIS — H2513 Age-related nuclear cataract, bilateral: Secondary | ICD-10-CM | POA: Diagnosis not present

## 2014-07-27 DIAGNOSIS — H5203 Hypermetropia, bilateral: Secondary | ICD-10-CM | POA: Diagnosis not present

## 2014-07-27 DIAGNOSIS — H4011X2 Primary open-angle glaucoma, moderate stage: Secondary | ICD-10-CM | POA: Diagnosis not present

## 2014-11-12 ENCOUNTER — Other Ambulatory Visit: Payer: Self-pay | Admitting: Internal Medicine

## 2014-11-12 ENCOUNTER — Other Ambulatory Visit: Payer: Self-pay

## 2014-11-12 DIAGNOSIS — N6489 Other specified disorders of breast: Secondary | ICD-10-CM

## 2014-11-22 ENCOUNTER — Ambulatory Visit
Admission: RE | Admit: 2014-11-22 | Discharge: 2014-11-22 | Disposition: A | Discharge status: Home / Self Care | Payer: Medicare Other | Source: Ambulatory Visit | Attending: Internal Medicine | Admitting: Internal Medicine

## 2014-11-22 ENCOUNTER — Ambulatory Visit
Admission: RE | Admit: 2014-11-22 | Discharge: 2014-11-22 | Disposition: A | Discharge status: Home / Self Care | Payer: Federal, State, Local not specified - PPO | Source: Ambulatory Visit | Attending: Internal Medicine | Admitting: Internal Medicine

## 2014-11-22 DIAGNOSIS — R928 Other abnormal and inconclusive findings on diagnostic imaging of breast: Secondary | ICD-10-CM | POA: Diagnosis not present

## 2014-11-22 DIAGNOSIS — N6489 Other specified disorders of breast: Secondary | ICD-10-CM

## 2015-01-12 DIAGNOSIS — Z79899 Other long term (current) drug therapy: Secondary | ICD-10-CM | POA: Diagnosis not present

## 2015-01-12 DIAGNOSIS — I10 Essential (primary) hypertension: Secondary | ICD-10-CM | POA: Diagnosis not present

## 2015-01-12 DIAGNOSIS — E668 Other obesity: Secondary | ICD-10-CM | POA: Diagnosis not present

## 2015-01-31 DIAGNOSIS — H4011X2 Primary open-angle glaucoma, moderate stage: Secondary | ICD-10-CM | POA: Diagnosis not present

## 2015-02-28 DIAGNOSIS — R7309 Other abnormal glucose: Secondary | ICD-10-CM | POA: Diagnosis not present

## 2015-02-28 DIAGNOSIS — Z79899 Other long term (current) drug therapy: Secondary | ICD-10-CM | POA: Diagnosis not present

## 2015-02-28 DIAGNOSIS — N182 Chronic kidney disease, stage 2 (mild): Secondary | ICD-10-CM | POA: Diagnosis not present

## 2015-02-28 DIAGNOSIS — I129 Hypertensive chronic kidney disease with stage 1 through stage 4 chronic kidney disease, or unspecified chronic kidney disease: Secondary | ICD-10-CM | POA: Diagnosis not present

## 2015-05-05 DIAGNOSIS — N182 Chronic kidney disease, stage 2 (mild): Secondary | ICD-10-CM | POA: Diagnosis not present

## 2015-05-05 DIAGNOSIS — Z79899 Other long term (current) drug therapy: Secondary | ICD-10-CM | POA: Diagnosis not present

## 2015-05-05 DIAGNOSIS — R7309 Other abnormal glucose: Secondary | ICD-10-CM | POA: Diagnosis not present

## 2015-05-05 DIAGNOSIS — I129 Hypertensive chronic kidney disease with stage 1 through stage 4 chronic kidney disease, or unspecified chronic kidney disease: Secondary | ICD-10-CM | POA: Diagnosis not present

## 2015-05-06 DIAGNOSIS — R7309 Other abnormal glucose: Secondary | ICD-10-CM | POA: Diagnosis not present

## 2015-05-06 DIAGNOSIS — N182 Chronic kidney disease, stage 2 (mild): Secondary | ICD-10-CM | POA: Diagnosis not present

## 2015-07-26 DIAGNOSIS — I129 Hypertensive chronic kidney disease with stage 1 through stage 4 chronic kidney disease, or unspecified chronic kidney disease: Secondary | ICD-10-CM | POA: Diagnosis not present

## 2015-07-26 DIAGNOSIS — E559 Vitamin D deficiency, unspecified: Secondary | ICD-10-CM | POA: Diagnosis not present

## 2015-07-26 DIAGNOSIS — N182 Chronic kidney disease, stage 2 (mild): Secondary | ICD-10-CM | POA: Diagnosis not present

## 2015-07-26 DIAGNOSIS — R7309 Other abnormal glucose: Secondary | ICD-10-CM | POA: Diagnosis not present

## 2015-07-26 DIAGNOSIS — Z0000 Encounter for general adult medical examination without abnormal findings: Secondary | ICD-10-CM | POA: Diagnosis not present

## 2015-08-03 DIAGNOSIS — E119 Type 2 diabetes mellitus without complications: Secondary | ICD-10-CM | POA: Diagnosis not present

## 2015-08-03 DIAGNOSIS — H5203 Hypermetropia, bilateral: Secondary | ICD-10-CM | POA: Diagnosis not present

## 2015-08-03 DIAGNOSIS — H401122 Primary open-angle glaucoma, left eye, moderate stage: Secondary | ICD-10-CM | POA: Diagnosis not present

## 2015-08-03 DIAGNOSIS — H401112 Primary open-angle glaucoma, right eye, moderate stage: Secondary | ICD-10-CM | POA: Diagnosis not present

## 2016-01-25 DIAGNOSIS — R7309 Other abnormal glucose: Secondary | ICD-10-CM | POA: Diagnosis not present

## 2016-01-25 DIAGNOSIS — N182 Chronic kidney disease, stage 2 (mild): Secondary | ICD-10-CM | POA: Diagnosis not present

## 2016-01-25 DIAGNOSIS — I129 Hypertensive chronic kidney disease with stage 1 through stage 4 chronic kidney disease, or unspecified chronic kidney disease: Secondary | ICD-10-CM | POA: Diagnosis not present

## 2016-01-25 DIAGNOSIS — Z79899 Other long term (current) drug therapy: Secondary | ICD-10-CM | POA: Diagnosis not present

## 2016-02-06 DIAGNOSIS — H401123 Primary open-angle glaucoma, left eye, severe stage: Secondary | ICD-10-CM | POA: Diagnosis not present

## 2016-02-06 DIAGNOSIS — H401112 Primary open-angle glaucoma, right eye, moderate stage: Secondary | ICD-10-CM | POA: Diagnosis not present

## 2016-02-06 DIAGNOSIS — H04123 Dry eye syndrome of bilateral lacrimal glands: Secondary | ICD-10-CM | POA: Diagnosis not present

## 2016-08-06 DIAGNOSIS — H2513 Age-related nuclear cataract, bilateral: Secondary | ICD-10-CM | POA: Diagnosis not present

## 2016-08-06 DIAGNOSIS — H401112 Primary open-angle glaucoma, right eye, moderate stage: Secondary | ICD-10-CM | POA: Diagnosis not present

## 2016-08-06 DIAGNOSIS — H401123 Primary open-angle glaucoma, left eye, severe stage: Secondary | ICD-10-CM | POA: Diagnosis not present

## 2016-08-06 DIAGNOSIS — H524 Presbyopia: Secondary | ICD-10-CM | POA: Diagnosis not present

## 2016-08-30 DIAGNOSIS — J209 Acute bronchitis, unspecified: Secondary | ICD-10-CM | POA: Diagnosis not present

## 2016-08-30 DIAGNOSIS — Z0000 Encounter for general adult medical examination without abnormal findings: Secondary | ICD-10-CM | POA: Diagnosis not present

## 2016-08-30 DIAGNOSIS — R7309 Other abnormal glucose: Secondary | ICD-10-CM | POA: Diagnosis not present

## 2016-08-30 DIAGNOSIS — E559 Vitamin D deficiency, unspecified: Secondary | ICD-10-CM | POA: Diagnosis not present

## 2016-08-30 DIAGNOSIS — I129 Hypertensive chronic kidney disease with stage 1 through stage 4 chronic kidney disease, or unspecified chronic kidney disease: Secondary | ICD-10-CM | POA: Diagnosis not present

## 2016-08-30 DIAGNOSIS — N182 Chronic kidney disease, stage 2 (mild): Secondary | ICD-10-CM | POA: Diagnosis not present

## 2016-09-13 DIAGNOSIS — Z8679 Personal history of other diseases of the circulatory system: Secondary | ICD-10-CM | POA: Diagnosis not present

## 2016-09-13 DIAGNOSIS — I10 Essential (primary) hypertension: Secondary | ICD-10-CM | POA: Diagnosis not present

## 2016-09-13 DIAGNOSIS — E669 Obesity, unspecified: Secondary | ICD-10-CM | POA: Diagnosis not present

## 2016-09-13 DIAGNOSIS — I4891 Unspecified atrial fibrillation: Secondary | ICD-10-CM | POA: Diagnosis not present

## 2016-09-24 DIAGNOSIS — I4891 Unspecified atrial fibrillation: Secondary | ICD-10-CM | POA: Diagnosis not present

## 2016-09-26 DIAGNOSIS — I4891 Unspecified atrial fibrillation: Secondary | ICD-10-CM | POA: Diagnosis not present

## 2016-10-09 DIAGNOSIS — I10 Essential (primary) hypertension: Secondary | ICD-10-CM | POA: Diagnosis not present

## 2016-10-09 DIAGNOSIS — E669 Obesity, unspecified: Secondary | ICD-10-CM | POA: Diagnosis not present

## 2016-10-09 DIAGNOSIS — I4891 Unspecified atrial fibrillation: Secondary | ICD-10-CM | POA: Diagnosis not present

## 2016-10-09 DIAGNOSIS — Z8679 Personal history of other diseases of the circulatory system: Secondary | ICD-10-CM | POA: Diagnosis not present

## 2016-12-04 DIAGNOSIS — H401112 Primary open-angle glaucoma, right eye, moderate stage: Secondary | ICD-10-CM | POA: Diagnosis not present

## 2016-12-04 DIAGNOSIS — H401123 Primary open-angle glaucoma, left eye, severe stage: Secondary | ICD-10-CM | POA: Diagnosis not present

## 2017-01-02 DIAGNOSIS — R7309 Other abnormal glucose: Secondary | ICD-10-CM | POA: Diagnosis not present

## 2017-01-02 DIAGNOSIS — I129 Hypertensive chronic kidney disease with stage 1 through stage 4 chronic kidney disease, or unspecified chronic kidney disease: Secondary | ICD-10-CM | POA: Diagnosis not present

## 2017-01-02 DIAGNOSIS — N182 Chronic kidney disease, stage 2 (mild): Secondary | ICD-10-CM | POA: Diagnosis not present

## 2017-01-02 DIAGNOSIS — Z79899 Other long term (current) drug therapy: Secondary | ICD-10-CM | POA: Diagnosis not present

## 2017-01-08 ENCOUNTER — Other Ambulatory Visit: Payer: Self-pay

## 2017-01-21 DIAGNOSIS — H900 Conductive hearing loss, bilateral: Secondary | ICD-10-CM | POA: Diagnosis not present

## 2017-04-17 DIAGNOSIS — E6609 Other obesity due to excess calories: Secondary | ICD-10-CM | POA: Diagnosis not present

## 2017-04-17 DIAGNOSIS — Z8679 Personal history of other diseases of the circulatory system: Secondary | ICD-10-CM | POA: Diagnosis not present

## 2017-04-17 DIAGNOSIS — I10 Essential (primary) hypertension: Secondary | ICD-10-CM | POA: Diagnosis not present

## 2017-04-17 DIAGNOSIS — I482 Chronic atrial fibrillation: Secondary | ICD-10-CM | POA: Diagnosis not present

## 2017-04-18 ENCOUNTER — Other Ambulatory Visit: Payer: Self-pay | Admitting: Nurse Practitioner

## 2017-04-18 ENCOUNTER — Ambulatory Visit
Admission: RE | Admit: 2017-04-18 | Discharge: 2017-04-18 | Disposition: A | Discharge status: Home / Self Care | Payer: Medicare Other | Source: Ambulatory Visit | Attending: Nurse Practitioner | Admitting: Nurse Practitioner

## 2017-04-18 DIAGNOSIS — R059 Cough, unspecified: Secondary | ICD-10-CM

## 2017-04-18 DIAGNOSIS — R05 Cough: Secondary | ICD-10-CM

## 2017-05-20 DIAGNOSIS — H04123 Dry eye syndrome of bilateral lacrimal glands: Secondary | ICD-10-CM | POA: Diagnosis not present

## 2017-05-20 DIAGNOSIS — H524 Presbyopia: Secondary | ICD-10-CM | POA: Diagnosis not present

## 2017-05-20 DIAGNOSIS — H401123 Primary open-angle glaucoma, left eye, severe stage: Secondary | ICD-10-CM | POA: Diagnosis not present

## 2017-05-20 DIAGNOSIS — H2513 Age-related nuclear cataract, bilateral: Secondary | ICD-10-CM | POA: Diagnosis not present

## 2017-06-03 DIAGNOSIS — N182 Chronic kidney disease, stage 2 (mild): Secondary | ICD-10-CM | POA: Diagnosis not present

## 2017-06-03 DIAGNOSIS — R7309 Other abnormal glucose: Secondary | ICD-10-CM | POA: Diagnosis not present

## 2017-06-03 DIAGNOSIS — I129 Hypertensive chronic kidney disease with stage 1 through stage 4 chronic kidney disease, or unspecified chronic kidney disease: Secondary | ICD-10-CM | POA: Diagnosis not present

## 2017-06-03 DIAGNOSIS — Z6841 Body Mass Index (BMI) 40.0 and over, adult: Secondary | ICD-10-CM | POA: Diagnosis not present

## 2017-09-03 DIAGNOSIS — Z1211 Encounter for screening for malignant neoplasm of colon: Secondary | ICD-10-CM | POA: Diagnosis not present

## 2017-09-03 DIAGNOSIS — K641 Second degree hemorrhoids: Secondary | ICD-10-CM | POA: Diagnosis not present

## 2017-09-25 DIAGNOSIS — N182 Chronic kidney disease, stage 2 (mild): Secondary | ICD-10-CM | POA: Diagnosis not present

## 2017-09-25 DIAGNOSIS — E559 Vitamin D deficiency, unspecified: Secondary | ICD-10-CM | POA: Diagnosis not present

## 2017-09-25 DIAGNOSIS — E2839 Other primary ovarian failure: Secondary | ICD-10-CM | POA: Diagnosis not present

## 2017-09-25 DIAGNOSIS — R7309 Other abnormal glucose: Secondary | ICD-10-CM | POA: Diagnosis not present

## 2017-09-25 DIAGNOSIS — I129 Hypertensive chronic kidney disease with stage 1 through stage 4 chronic kidney disease, or unspecified chronic kidney disease: Secondary | ICD-10-CM | POA: Diagnosis not present

## 2017-11-19 DIAGNOSIS — H401123 Primary open-angle glaucoma, left eye, severe stage: Secondary | ICD-10-CM | POA: Diagnosis not present

## 2017-11-19 DIAGNOSIS — H401112 Primary open-angle glaucoma, right eye, moderate stage: Secondary | ICD-10-CM | POA: Diagnosis not present

## 2017-11-19 DIAGNOSIS — H04123 Dry eye syndrome of bilateral lacrimal glands: Secondary | ICD-10-CM | POA: Diagnosis not present

## 2017-12-04 DIAGNOSIS — Z1211 Encounter for screening for malignant neoplasm of colon: Secondary | ICD-10-CM | POA: Diagnosis not present

## 2017-12-04 DIAGNOSIS — Z1212 Encounter for screening for malignant neoplasm of rectum: Secondary | ICD-10-CM | POA: Diagnosis not present

## 2018-01-16 DEATH — deceased

## 2018-02-18 IMAGING — DX DG CHEST 2V
2 series · 2 of 2 positions shown · non-contrast
Comparison: Chest x-ray of 01/08/2008

CLINICAL DATA: Coughing congestion for 1 and half weeks, crackles
at the right lung base

EXAM:
CHEST  2 VIEW

[dg chest 2 view (1 of 2)]
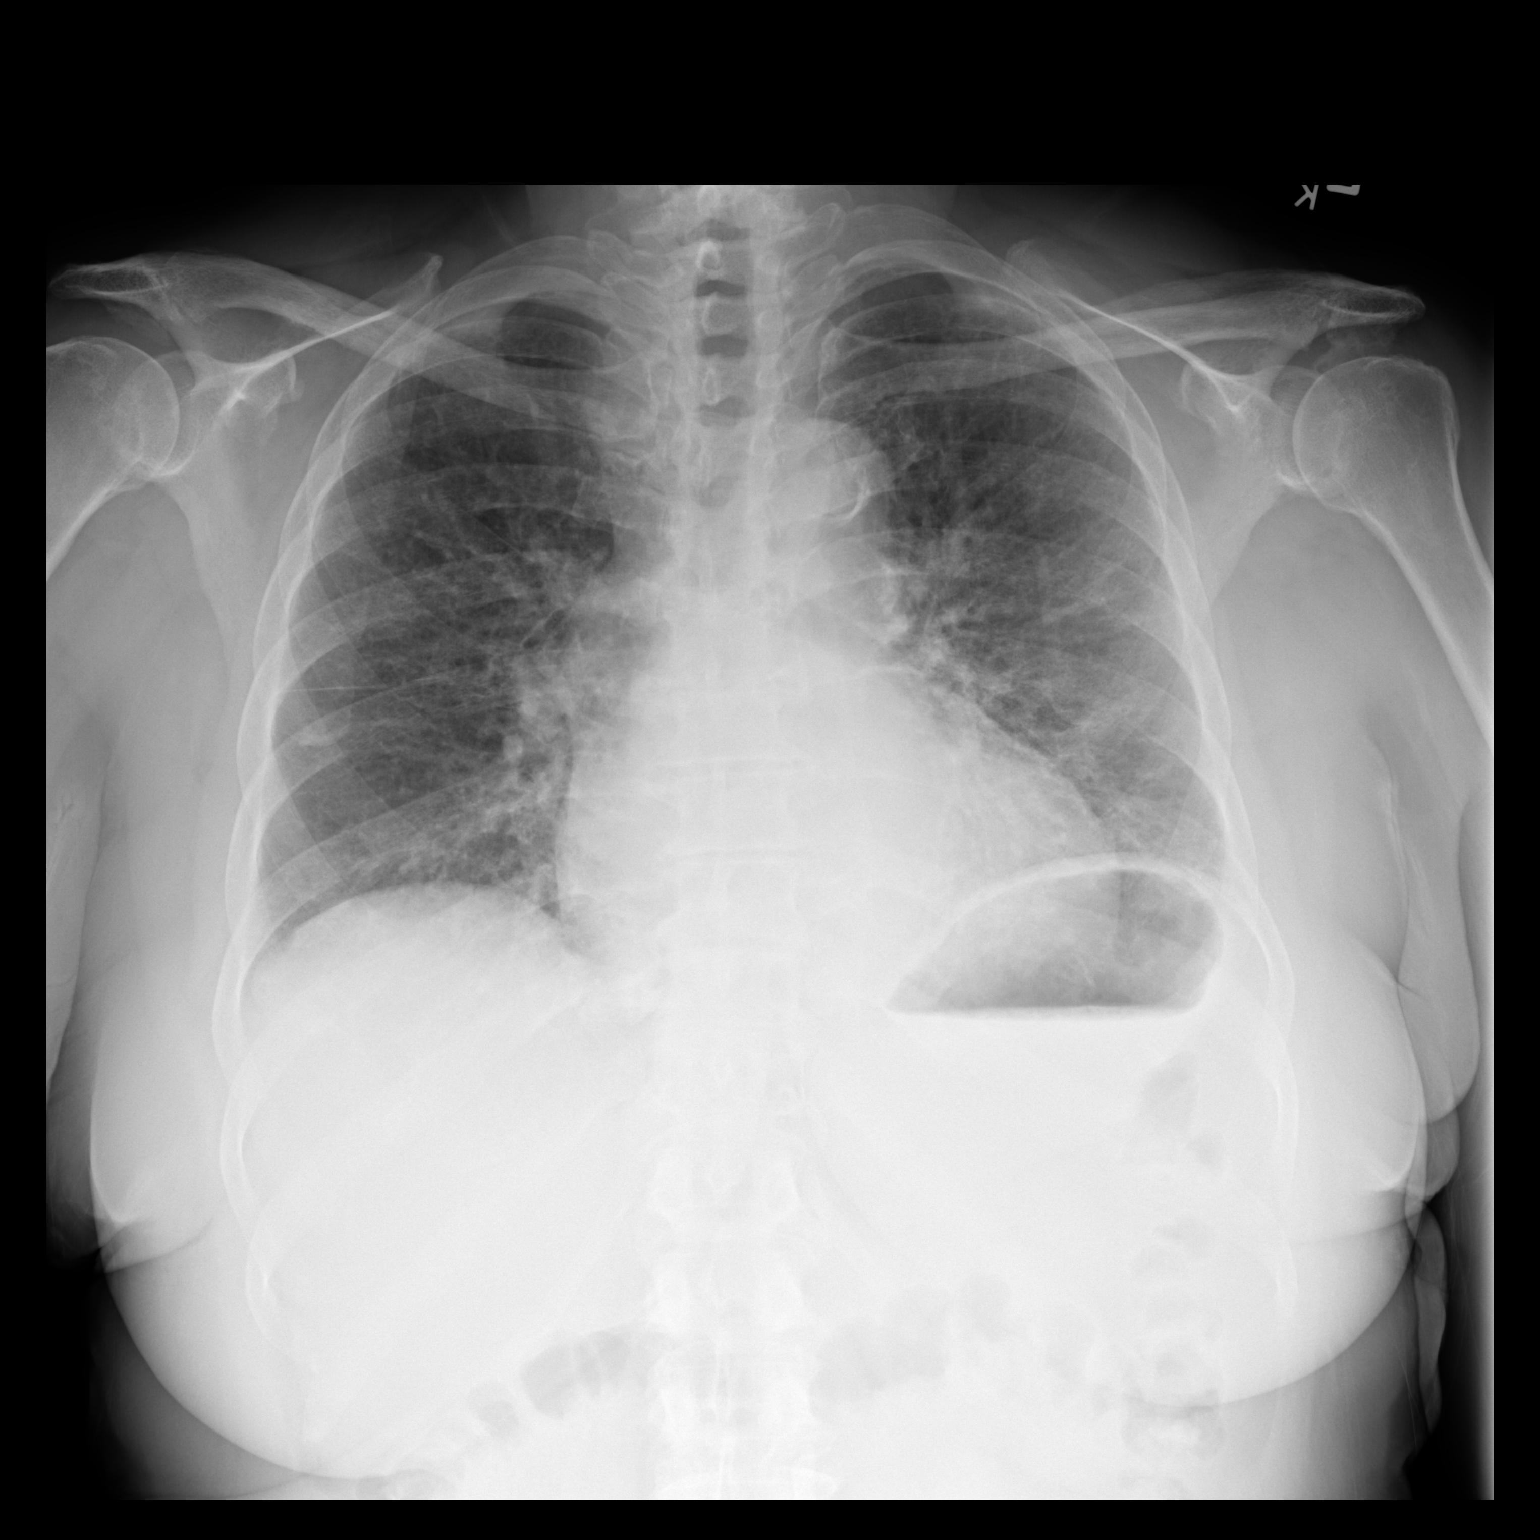

[dg chest 2 view (2 of 2)]
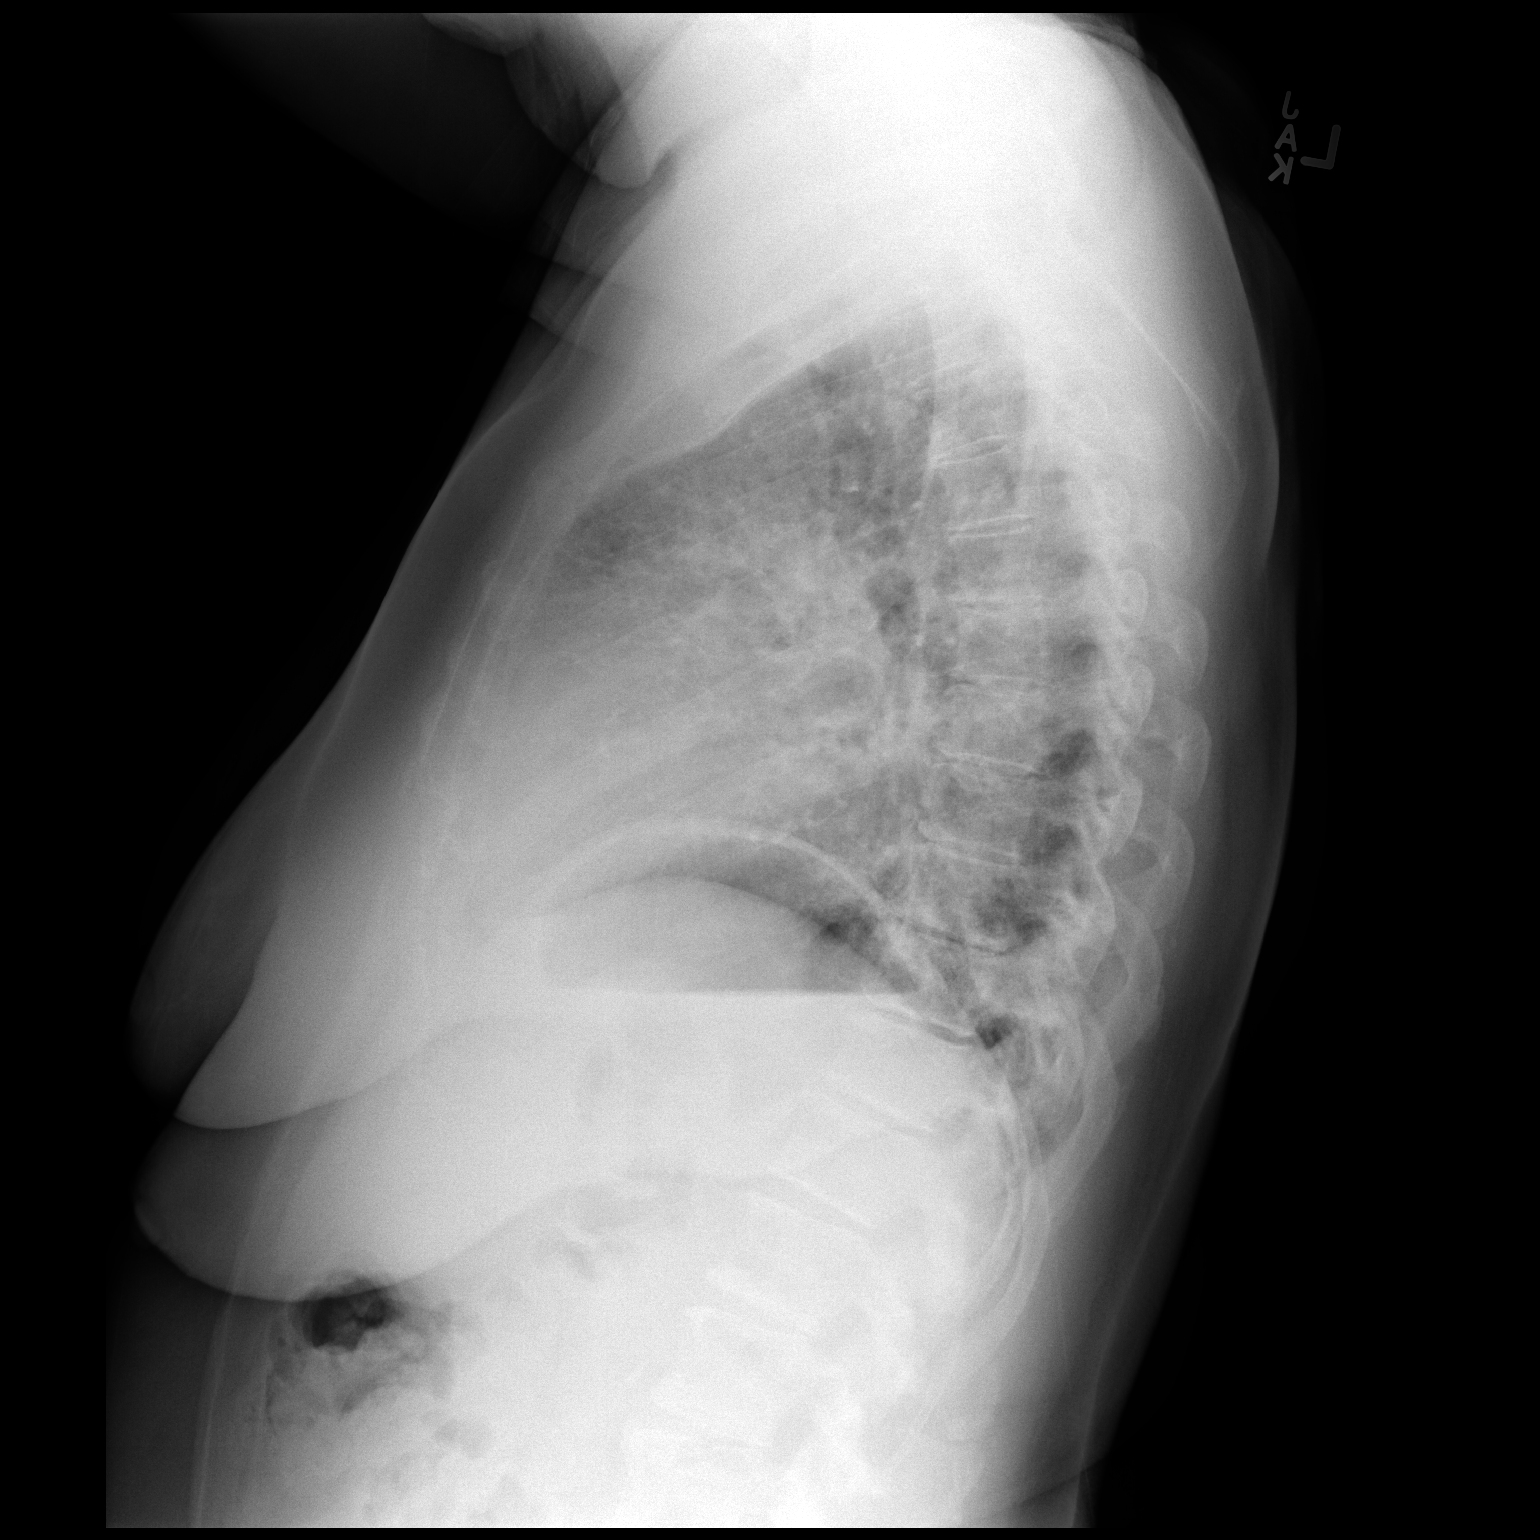

[2 of 2 positions shown; findings below may reference images not displayed]

FINDINGS: There are coarse prominent interstitial markings bilaterally most
typical of a chronic process. No definite pneumonia or effusion is
currently seen. Mediastinal and hilar contours are unremarkable. The
heart is mildly enlarged and stable. No acute bony abnormality is
seen.
IMPRESSION: Probable chronic fibrotic change throughout the lungs. No definite
active process. Stable mild cardiomegaly.

## 2018-03-27 ENCOUNTER — Encounter: Payer: Self-pay | Admitting: Internal Medicine

## 2018-03-27 ENCOUNTER — Ambulatory Visit (INDEPENDENT_AMBULATORY_CARE_PROVIDER_SITE_OTHER): Payer: Medicare Other | Admitting: Internal Medicine

## 2018-03-27 ENCOUNTER — Telehealth: Payer: Self-pay

## 2018-03-27 VITALS — BP 118/72 | HR 95 | Temp 97.6°F | Ht 59.75 in | Wt 208.2 lb

## 2018-03-27 DIAGNOSIS — N183 Chronic kidney disease, stage 3 unspecified: Secondary | ICD-10-CM

## 2018-03-27 DIAGNOSIS — Z23 Encounter for immunization: Secondary | ICD-10-CM

## 2018-03-27 DIAGNOSIS — I482 Chronic atrial fibrillation, unspecified: Secondary | ICD-10-CM | POA: Diagnosis not present

## 2018-03-27 DIAGNOSIS — I1310 Hypertensive heart and chronic kidney disease without heart failure, with stage 1 through stage 4 chronic kidney disease, or unspecified chronic kidney disease: Secondary | ICD-10-CM | POA: Diagnosis not present

## 2018-03-27 DIAGNOSIS — R7309 Other abnormal glucose: Secondary | ICD-10-CM | POA: Diagnosis not present

## 2018-03-27 DIAGNOSIS — Z7982 Long term (current) use of aspirin: Secondary | ICD-10-CM | POA: Diagnosis not present

## 2018-03-27 MED ORDER — ASPIRIN EC 81 MG PO TBEC: 81.0000 mg | DELAYED_RELEASE_TABLET | Freq: Every day | ORAL | 2 refills | Status: AC

## 2018-03-27 NOTE — Telephone Encounter (Signed)
The patient wanted to know if someone called her yesterday and I told her that I didn't see in her chart that someone did.

## 2018-03-28 LAB — CMP14+EGFR
ALT: 14 IU/L (ref 0–32)
AST: 22 IU/L (ref 0–40)
Albumin/Globulin Ratio: 1.7 (ref 1.2–2.2)
Albumin: 4.3 g/dL (ref 3.5–4.8)
Alkaline Phosphatase: 57 IU/L (ref 39–117)
BUN/Creatinine Ratio: 16 (ref 12–28)
BUN: 15 mg/dL (ref 8–27)
Bilirubin Total: 0.5 mg/dL (ref 0.0–1.2)
CO2: 24 mmol/L (ref 20–29)
Calcium: 9.5 mg/dL (ref 8.7–10.3)
Chloride: 100 mmol/L (ref 96–106)
Creatinine, Ser: 0.96 mg/dL (ref 0.57–1.00)
GFR calc Af Amer: 65 mL/min/{1.73_m2} (ref >59)
GFR calc non Af Amer: 56 mL/min/{1.73_m2} — ABNORMAL LOW (ref >59)
Globulin, Total: 2.5 g/dL (ref 1.5–4.5)
Glucose: 98 mg/dL (ref 65–99)
Potassium: 4.5 mmol/L (ref 3.5–5.2)
Sodium: 140 mmol/L (ref 134–144)
Total Protein: 6.8 g/dL (ref 6.0–8.5)

## 2018-03-28 LAB — LIPID PANEL
Chol/HDL Ratio: 2.9 ratio (ref 0.0–4.4)
Cholesterol, Total: 167 mg/dL (ref 100–199)
HDL: 58 mg/dL (ref >39)
LDL Calculated: 94 mg/dL (ref 0–99)
Triglycerides: 74 mg/dL (ref 0–149)
VLDL Cholesterol Cal: 15 mg/dL (ref 5–40)

## 2018-03-28 LAB — HEMOGLOBIN A1C
Est. average glucose Bld gHb Est-mCnc: 126 mg/dL
Hgb A1c MFr Bld: 6 % — ABNORMAL HIGH (ref 4.8–5.6)

## 2018-03-29 ENCOUNTER — Encounter: Payer: Self-pay | Admitting: Internal Medicine

## 2018-03-29 NOTE — Progress Notes (Signed)
  Subjective:     Patient ID: Kayla Calhoun , female    DOB: [birth year] , 77 y.o.   MRN: [id number]   Hypertension  This is a chronic problem. The current episode started more than 1 year ago. The problem is controlled. Hypertensive end-organ damage includes kidney disease.     History reviewed. No pertinent past medical history.    Current Outpatient Medications:  .  amLODipine (NORVASC) 5 MG tablet, Take 5 mg by mouth daily., Disp: , Rfl:  .  metoprolol succinate (TOPROL-XL) 25 MG 24 hr tablet, Take 25 mg by mouth daily., Disp: , Rfl:  .  triamterene-hydrochlorothiazide (MAXZIDE-25) 37.5-25 MG tablet, Take 1 tablet by mouth daily., Disp: , Rfl:  .  aspirin EC 81 MG tablet, Take 1 tablet (81 mg total) by mouth daily., Disp: 150 tablet, Rfl: 2   No Known Allergies   Review of Systems  Constitutional: Negative.   HENT: Negative.   Eyes: Negative.   Respiratory: Negative.   Cardiovascular: Negative.   Gastrointestinal: Negative.   Genitourinary: Negative.   Hematological: Negative.      Today's Vitals   03/27/18 1036  BP: 118/72  Pulse: 95  Temp: 97.6 F (36.4 C)  TempSrc: Oral  Weight: 208 lb 3.2 oz (94.4 kg)  Height: 4' 11.75" (1.518 m)  PainSc: 0-No pain   Body mass index is 41 kg/m.   Objective:  Physical Exam  Constitutional: She is oriented to person, place, and time. She appears well-developed and well-nourished.  Eyes: EOM are normal.  Neck: Normal range of motion. Neck supple.  Cardiovascular: Normal rate, normal heart sounds and normal pulses. An irregular rhythm present.  Pulmonary/Chest: Effort normal and breath sounds normal.  Neurological: She is alert and oriented to person, place, and time.  Psychiatric: She has a normal mood and affect.        Assessment And Plan:     Hypertensive heart and renal disease with renal failure, stage 1 through stage 4 or unspecified chronic kidney disease, without heart failure - WELL CONTROLLED. SHE WILL  CONTINUE WITH CURRENT MEDS. SHE IS ENCOURAGED TO LIMIT HER SALT INTAKE.  - Plan: CMP14+EGFR, Lipid Profile  Chronic atrial fibrillation - CHRONIC, YET RATE CONTROLLED.   Chronic renal disease, stage III (HCC) - CHRONIC, SHE IS ENCOURAGED TO STAY WELL HYDRATED.   Other abnormal glucose - SHE HAS HAD ELEVATED BS IN THE PAST. I WILL RECHECK HBA1C TODAY. SHE IS ENCOURAGED TO AVOID SUGARY BEVERAGES AND REFINED CARBS INCL. WHITE BREADS, RICE & PASTA. - Plan: Hemoglobin A1c  Need for vaccination for Strep pneumoniae - SHE WAS GIVEN PNEUMOVAX-23 IMMUNIZATION/. SHE DECLINED THE FLU VACCINE.     [name], MD

## 2018-03-31 DIAGNOSIS — Z8679 Personal history of other diseases of the circulatory system: Secondary | ICD-10-CM | POA: Diagnosis not present

## 2018-03-31 DIAGNOSIS — I4821 Permanent atrial fibrillation: Secondary | ICD-10-CM | POA: Diagnosis not present

## 2018-03-31 DIAGNOSIS — I10 Essential (primary) hypertension: Secondary | ICD-10-CM | POA: Diagnosis not present

## 2018-03-31 DIAGNOSIS — E6609 Other obesity due to excess calories: Secondary | ICD-10-CM | POA: Diagnosis not present

## 2018-06-03 DIAGNOSIS — H401112 Primary open-angle glaucoma, right eye, moderate stage: Secondary | ICD-10-CM | POA: Diagnosis not present

## 2018-06-03 DIAGNOSIS — H401123 Primary open-angle glaucoma, left eye, severe stage: Secondary | ICD-10-CM | POA: Diagnosis not present

## 2018-06-03 DIAGNOSIS — H524 Presbyopia: Secondary | ICD-10-CM | POA: Diagnosis not present

## 2018-06-03 DIAGNOSIS — H2513 Age-related nuclear cataract, bilateral: Secondary | ICD-10-CM | POA: Diagnosis not present

## 2018-06-03 LAB — HM DIABETES EYE EXAM

## 2018-06-05 ENCOUNTER — Other Ambulatory Visit: Payer: Self-pay | Admitting: Internal Medicine

## 2018-08-20 ENCOUNTER — Other Ambulatory Visit: Payer: Self-pay | Admitting: Nurse Practitioner

## 2018-10-01 ENCOUNTER — Ambulatory Visit: Payer: Medicare Other | Admitting: Internal Medicine

## 2018-10-06 DIAGNOSIS — H401112 Primary open-angle glaucoma, right eye, moderate stage: Secondary | ICD-10-CM | POA: Diagnosis not present

## 2018-10-06 DIAGNOSIS — H401123 Primary open-angle glaucoma, left eye, severe stage: Secondary | ICD-10-CM | POA: Diagnosis not present

## 2018-10-06 DIAGNOSIS — H2513 Age-related nuclear cataract, bilateral: Secondary | ICD-10-CM | POA: Diagnosis not present

## 2018-10-20 ENCOUNTER — Telehealth: Payer: Self-pay | Admitting: Internal Medicine

## 2018-10-20 NOTE — Telephone Encounter (Signed)
I spoke with the patient and asked her to come at 9:30 on 10/22/2018 instead of 9:45.  She agreed. VDM (DD)

## 2018-10-22 ENCOUNTER — Ambulatory Visit: Payer: Self-pay | Admitting: Internal Medicine

## 2018-10-22 ENCOUNTER — Encounter: Payer: Self-pay | Admitting: Internal Medicine

## 2018-10-22 ENCOUNTER — Ambulatory Visit (INDEPENDENT_AMBULATORY_CARE_PROVIDER_SITE_OTHER): Payer: Medicare Other

## 2018-10-22 ENCOUNTER — Other Ambulatory Visit: Payer: Self-pay

## 2018-10-22 ENCOUNTER — Ambulatory Visit (INDEPENDENT_AMBULATORY_CARE_PROVIDER_SITE_OTHER): Payer: Medicare Other | Admitting: Internal Medicine

## 2018-10-22 ENCOUNTER — Other Ambulatory Visit: Payer: Self-pay | Admitting: Internal Medicine

## 2018-10-22 VITALS — BP 138/78 | HR 72 | Temp 97.6°F | Ht 60.2 in | Wt 210.2 lb

## 2018-10-22 DIAGNOSIS — I1310 Hypertensive heart and chronic kidney disease without heart failure, with stage 1 through stage 4 chronic kidney disease, or unspecified chronic kidney disease: Secondary | ICD-10-CM

## 2018-10-22 DIAGNOSIS — E2839 Other primary ovarian failure: Secondary | ICD-10-CM

## 2018-10-22 DIAGNOSIS — R7309 Other abnormal glucose: Secondary | ICD-10-CM | POA: Diagnosis not present

## 2018-10-22 DIAGNOSIS — I482 Chronic atrial fibrillation, unspecified: Secondary | ICD-10-CM | POA: Diagnosis not present

## 2018-10-22 DIAGNOSIS — N183 Chronic kidney disease, stage 3 unspecified: Secondary | ICD-10-CM

## 2018-10-22 DIAGNOSIS — Z6841 Body Mass Index (BMI) 40.0 and over, adult: Secondary | ICD-10-CM | POA: Diagnosis not present

## 2018-10-22 DIAGNOSIS — Z0000 Encounter for general adult medical examination without abnormal findings: Secondary | ICD-10-CM

## 2018-10-22 LAB — POCT URINALYSIS DIPSTICK
Bilirubin, UA: NEGATIVE
Blood, UA: NEGATIVE
Glucose, UA: NEGATIVE
Ketones, UA: NEGATIVE
Nitrite, UA: NEGATIVE
Protein, UA: NEGATIVE
Spec Grav, UA: 1.02 (ref 1.010–1.025)
Urobilinogen, UA: 1 E.U./dL
pH, UA: 6.5 (ref 5.0–8.0)

## 2018-10-22 LAB — POCT UA - MICROALBUMIN
Albumin/Creatinine Ratio, Urine, POC: <30
Creatinine, POC: 200 mg/dL
Microalbumin Ur, POC: 10 mg/L

## 2018-10-22 MED ORDER — TRIAMTERENE-HCTZ 37.5-25 MG PO TABS: 1.0000 | ORAL_TABLET | Freq: Every day | ORAL | 1 refills | Status: DC

## 2018-10-22 NOTE — Progress Notes (Signed)
Subjective:   Kayla Calhoun is a 78 y.o. female who presents for Medicare Annual (Subsequent) preventive examination.  Review of Systems:  n/a Cardiac Risk Factors include: advanced age (>1355men, 69>65 women);hypertension;obesity (BMI >30kg/m2)     Objective:     Vitals: BP 138/78 (BP Location: Left Arm, Patient Position: Sitting, Cuff Size: Large)   Pulse 72   Temp 97.6 F (36.4 C) (Oral)   Ht 5' 0.2" (1.529 m)   Wt 210 lb 3.2 oz (95.3 kg)   SpO2 94%   BMI 40.78 kg/m   Body mass index is 40.78 kg/m.  Advanced Directives 10/22/2018  Does Patient Have a Medical Advance Directive? Yes  Type of Estate agentAdvance Directive Healthcare Power of [employer]corn State UniversityAttorney;Living will  Copy of Healthcare Power of Attorney in Chart? No - copy requested    Tobacco Social History   Tobacco Use  Smoking Status Former Smoker  . Years: 10.00  Smokeless Tobacco Never Used  Tobacco Comment   been quit 35 years     Counseling given: Not Answered Comment: been quit 35 years   Clinical Intake:  Pre-visit preparation completed: Yes  Pain : No/denies pain Pain Score: 0-No pain     Nutritional Status: BMI > 30  Obese Nutritional Risks: None Diabetes: No  How often do you need to have someone help you when you read instructions, pamphlets, or other written materials from your doctor or pharmacy?: 1 - Never What is the last grade level you completed in school?: 13 years  Interpreter Needed?: No  Information entered by :: NAllen LPN  Past Medical History:  Diagnosis Date  . Hypertension    Past Surgical History:  Procedure Laterality Date  . ABDOMINAL HYSTERECTOMY    . KIDNEY SURGERY    . TONSILLECTOMY     Family History  Problem Relation Age of Onset  . Stroke Mother   . Prostate cancer Father   . Stroke Sister   . Hypertension Sister   . Hypertension Brother   . Stroke Brother    Social History   Socioeconomic History  . Marital status: Widowed    Spouse name: Not on file  .  Number of children: Not on file  . Years of education: Not on file  . Highest education level: Not on file  Occupational History  . Occupation: retired  Engineer, productionocial Needs  . Financial resource strain: Not hard at all  . Food insecurity:    Worry: Never true    Inability: Never true  . Transportation needs:    Medical: No    Non-medical: No  Tobacco Use  . Smoking status: Former Smoker    Years: 10.00  . Smokeless tobacco: Never Used  . Tobacco comment: been quit 35 years  Substance and Sexual Activity  . Alcohol use: Not Currently    Frequency: Never  . Drug use: Never  . Sexual activity: Not Currently  Lifestyle  . Physical activity:    Days per week: 7 days    Minutes per session: 40 min  . Stress: Not at all  Relationships  . Social connections:    Talks on phone: Not on file    Gets together: Not on file    Attends religious service: Not on file    Active member of club or organization: Not on file    Attends meetings of clubs or organizations: Not on file    Relationship status: Not on file  Other Topics Concern  . Not on file  Social History Narrative  . Not on file    Outpatient Encounter Medications as of 10/22/2018  Medication Sig  . ALPHAGAN P 0.1 % SOLN INSTILL 1 DROP INTO BOTH EYES EVERY 12 HOURS  . amLODipine (NORVASC) 5 MG tablet TAKE 1 TABLET BY MOUTH EVERY DAY  . aspirin EC 81 MG tablet Take 1 tablet (81 mg total) by mouth daily.  Marland Kitchen levobunolol (BETAGAN) 0.5 % ophthalmic solution PLACE 1 DROP INTO BOTH EYES TWICE A DAY  . metoprolol succinate (TOPROL-XL) 25 MG 24 hr tablet Take 25 mg by mouth daily.  . TRAVATAN Z 0.004 % SOLN ophthalmic solution   . triamterene-hydrochlorothiazide (MAXZIDE-25) 37.5-25 MG tablet TAKE 1 TABLET BY MOUTH EVERY DAY   No facility-administered encounter medications on file as of 10/22/2018.     Activities of Daily Living In your present state of health, do you have any difficulty performing the following activities: 10/22/2018   Hearing? Y  Comment wears a hearing aide right ear  Vision? N  Difficulty concentrating or making decisions? N  Walking or climbing stairs? N  Dressing or bathing? N  Doing errands, shopping? N  Preparing Food and eating ? N  Using the Toilet? N  In the past six months, have you accidently leaked urine? N  Do you have problems with loss of bowel control? N  Managing your Medications? N  Managing your Finances? N  Housekeeping or managing your Housekeeping? N  Some recent data might be hidden    Patient Care Team: Dorothyann Peng, MD as PCP - General (Internal Medicine)    Assessment:   This is a routine wellness examination for Cedar.  Exercise Activities and Dietary recommendations Current Exercise Habits: Home exercise routine, Type of exercise: walking;calisthenics, Time (Minutes): 40, Frequency (Times/Week): 7, Weekly Exercise (Minutes/Week): 280, Intensity: Moderate  Goals    . DIET - INCREASE WATER INTAKE       Fall Risk Fall Risk  10/22/2018 03/27/2018 01/08/2017  Falls in the past year? 0 Yes No  Comment - - Emmi Telephone Survey: data to providers prior to load  Number falls in past yr: - 1 -  Injury with Fall? - No -  Risk for fall due to : Medication side effect - -  Follow up Education provided;Falls prevention discussed - -   Is the patient's home free of loose throw rugs in walkways, pet beds, electrical cords, etc?   yes      Grab bars in the bathroom? yes      Handrails on the stairs?   no      Adequate lighting?   yes  Timed Get Up and Go performed: n/a  Depression Screen PHQ 2/9 Scores 10/22/2018 03/27/2018  PHQ - 2 Score 0 0  PHQ- 9 Score 0 -     Cognitive Function     6CIT Screen 10/22/2018  What Year? 0 points  What month? 0 points  What time? 0 points  Count back from 20 0 points  Months in reverse 0 points  Repeat phrase 0 points  Total Score 0    Immunization History  Administered Date(s) Administered  . Pneumococcal  Polysaccharide-23 03/27/2018    Qualifies for Shingles Vaccine? yes  Screening Tests Health Maintenance  Topic Date Due  . DEXA SCAN  08/15/2003  . INFLUENZA VACCINE  01/17/2019  . PNA vac Low Risk Adult (2 of 2 - PCV13) 03/28/2019  . TETANUS/TDAP  06/30/2022    Cancer Screenings: Lung: Low Dose CT  Chest recommended if Age 82-80 years, 30 pack-year currently smoking OR have quit w/in 15years. Patient does not qualify. Breast:  Up to date on Mammogram? Yes   Up to date of Bone Density/Dexa? No Colorectal: not required  Additional Screenings: : Hepatitis C Screening: n/a     Plan:    Wants to increase water intake.   I have personally reviewed and noted the following in the patient's chart:   . Medical and social history . Use of alcohol, tobacco or illicit drugs  . Current medications and supplements . Functional ability and status . Nutritional status . Physical activity . Advanced directives . List of other physicians . Hospitalizations, surgeries, and ER visits in previous 12 months . Vitals . Screenings to include cognitive, depression, and falls . Referrals and appointments  In addition, I have reviewed and discussed with patient certain preventive protocols, quality metrics, and best practice recommendations. A written personalized care plan for preventive services as well as general preventive health recommendations were provided to patient.     Barb Merino, LPN  06/23/1094

## 2018-10-22 NOTE — Patient Instructions (Signed)

## 2018-10-22 NOTE — Patient Instructions (Signed)
Kayla Calhoun , Thank you for taking time to come for your Medicare Wellness Visit. I appreciate your ongoing commitment to your health goals. Please review the following plan we discussed and let me know if I can assist you in the future.   Screening recommendations/referrals: Colonoscopy: not required Mammogram: not required Bone Density: due Recommended yearly ophthalmology/optometry visit for glaucoma screening and checkup Recommended yearly dental visit for hygiene and checkup  Vaccinations: Influenza vaccine: declines Pneumococcal vaccine: 03/2018 Tdap vaccine: 06/2012 Shingles vaccine: discussed    Advanced directives: Please bring a copy of your POA (Power of [place]) and/or Living Will to your next appointment.    Conditions/risks identified: Obesity  Next appointment: 10/22/2018 at 10:15   Preventive Care 65 Years and Older, Female Preventive care refers to lifestyle choices and visits with your health care provider that can promote health and wellness. What does preventive care include?  A yearly physical exam. This is also called an annual well check.  Dental exams once or twice a year.  Routine eye exams. Ask your health care provider how often you should have your eyes checked.  Personal lifestyle choices, including:  Daily care of your teeth and gums.  Regular physical activity.  Eating a healthy diet.  Avoiding tobacco and drug use.  Limiting alcohol use.  Practicing safe sex.  Taking low-dose aspirin every day.  Taking vitamin and mineral supplements as recommended by your health care provider. What happens during an annual well check? The services and screenings done by your health care provider during your annual well check will depend on your age, overall health, lifestyle risk factors, and family history of disease. Counseling  Your health care provider may ask you questions about your:  Alcohol use.  Tobacco use.  Drug use.  Emotional  well-being.  Home and relationship well-being.  Sexual activity.  Eating habits.  History of falls.  Memory and ability to understand (cognition).  Work and work Astronomer.  Reproductive health. Screening  You may have the following tests or measurements:  Height, weight, and BMI.  Blood pressure.  Lipid and cholesterol levels. These may be checked every 5 years, or more frequently if you are over 101 years old.  Skin check.  Lung cancer screening. You may have this screening every year starting at age 57 if you have a 30-pack-year history of smoking and currently smoke or have quit within the past 15 years.  Fecal occult blood test (FOBT) of the stool. You may have this test every year starting at age 3.  Flexible sigmoidoscopy or colonoscopy. You may have a sigmoidoscopy every 5 years or a colonoscopy every 10 years starting at age 40.  Hepatitis C blood test.  Hepatitis B blood test.  Sexually transmitted disease (STD) testing.  Diabetes screening. This is done by checking your blood sugar (glucose) after you have not eaten for a while (fasting). You may have this done every 1-3 years.  Bone density scan. This is done to screen for osteoporosis. You may have this done starting at age 38.  Mammogram. This may be done every 1-2 years. Talk to your health care provider about how often you should have regular mammograms. Talk with your health care provider about your test results, treatment options, and if necessary, the need for more tests. Vaccines  Your health care provider may recommend certain vaccines, such as:  Influenza vaccine. This is recommended every year.  Tetanus, diphtheria, and acellular pertussis (Tdap, Td) vaccine. You may need a  Td booster every 10 years.  Zoster vaccine. You may need this after age 75.  Pneumococcal 13-valent conjugate (PCV13) vaccine. One dose is recommended after age 80.  Pneumococcal polysaccharide (PPSV23) vaccine. One  dose is recommended after age 47. Talk to your health care provider about which screenings and vaccines you need and how often you need them. This information is not intended to replace advice given to you by your health care provider. Make sure you discuss any questions you have with your health care provider. Document Released: 07/01/2015 Document Revised: 02/22/2016 Document Reviewed: 04/05/2015 Elsevier Interactive Patient Education  2017 [place] Prevention in the Home Falls can cause injuries. They can happen to people of all ages. There are many things you can do to make your home safe and to help prevent falls. What can I do on the outside of my home?  Regularly fix the edges of walkways and driveways and fix any cracks.  Remove anything that might make you trip as you walk through a door, such as a raised step or threshold.  Trim any bushes or trees on the path to your home.  Use bright outdoor lighting.  Clear any walking paths of anything that might make someone trip, such as rocks or tools.  Regularly check to see if handrails are loose or broken. Make sure that both sides of any steps have handrails.  Any raised decks and porches should have guardrails on the edges.  Have any leaves, snow, or ice cleared regularly.  Use sand or salt on walking paths during winter.  Clean up any spills in your garage right away. This includes oil or grease spills. What can I do in the bathroom?  Use night lights.  Install grab bars by the toilet and in the tub and shower. Do not use towel bars as grab bars.  Use non-skid mats or decals in the tub or shower.  If you need to sit down in the shower, use a plastic, non-slip stool.  Keep the floor dry. Clean up any water that spills on the floor as soon as it happens.  Remove soap buildup in the tub or shower regularly.  Attach bath mats securely with double-sided non-slip rug tape.  Do not have throw rugs and other  things on the floor that can make you trip. What can I do in the bedroom?  Use night lights.  Make sure that you have a light by your bed that is easy to reach.  Do not use any sheets or blankets that are too big for your bed. They should not hang down onto the floor.  Have a firm chair that has side arms. You can use this for support while you get dressed.  Do not have throw rugs and other things on the floor that can make you trip. What can I do in the kitchen?  Clean up any spills right away.  Avoid walking on wet floors.  Keep items that you use a lot in easy-to-reach places.  If you need to reach something above you, use a strong step stool that has a grab bar.  Keep electrical cords out of the way.  Do not use floor polish or wax that makes floors slippery. If you must use wax, use non-skid floor wax.  Do not have throw rugs and other things on the floor that can make you trip. What can I do with my stairs?  Do not leave any items on the stairs.  Make sure that there are handrails on both sides of the stairs and use them. Fix handrails that are broken or loose. Make sure that handrails are as long as the stairways.  Check any carpeting to make sure that it is firmly attached to the stairs. Fix any carpet that is loose or worn.  Avoid having throw rugs at the top or bottom of the stairs. If you do have throw rugs, attach them to the floor with carpet tape.  Make sure that you have a light switch at the top of the stairs and the bottom of the stairs. If you do not have them, ask someone to add them for you. What else can I do to help prevent falls?  Wear shoes that:  Do not have high heels.  Have rubber bottoms.  Are comfortable and fit you well.  Are closed at the toe. Do not wear sandals.  If you use a stepladder:  Make sure that it is fully opened. Do not climb a closed stepladder.  Make sure that both sides of the stepladder are locked into place.  Ask  someone to hold it for you, if possible.  Clearly mark and make sure that you can see:  Any grab bars or handrails.  First and last steps.  Where the edge of each step is.  Use tools that help you move around (mobility aids) if they are needed. These include:  Canes.  Walkers.  Scooters.  Crutches.  Turn on the lights when you go into a dark area. Replace any light bulbs as soon as they burn out.  Set up your furniture so you have a clear path. Avoid moving your furniture around.  If any of your floors are uneven, fix them.  If there are any pets around you, be aware of where they are.  Review your medicines with your doctor. Some medicines can make you feel dizzy. This can increase your chance of falling. Ask your doctor what other things that you can do to help prevent falls. This information is not intended to replace advice given to you by your health care provider. Make sure you discuss any questions you have with your health care provider. Document Released: 03/31/2009 Document Revised: 11/10/2015 Document Reviewed: 07/09/2014 Elsevier Interactive Patient Education  2017 [employer].

## 2018-10-22 NOTE — Addendum Note (Signed)
Addended by: Barb Merino on: 10/22/2018 04:03 PM   Modules accepted: Orders

## 2018-10-23 LAB — LIPID PANEL
Chol/HDL Ratio: 3 ratio (ref 0.0–4.4)
Cholesterol, Total: 187 mg/dL (ref 100–199)
HDL: 63 mg/dL (ref >39)
LDL Calculated: 111 mg/dL — ABNORMAL HIGH (ref 0–99)
Triglycerides: 67 mg/dL (ref 0–149)
VLDL Cholesterol Cal: 13 mg/dL (ref 5–40)

## 2018-10-23 LAB — CMP14+EGFR
ALT: 13 IU/L (ref 0–32)
AST: 24 IU/L (ref 0–40)
Albumin/Globulin Ratio: 1.5 (ref 1.2–2.2)
Albumin: 4.3 g/dL (ref 3.7–4.7)
Alkaline Phosphatase: 62 IU/L (ref 39–117)
BUN/Creatinine Ratio: 16 (ref 12–28)
BUN: 14 mg/dL (ref 8–27)
Bilirubin Total: 0.5 mg/dL (ref 0.0–1.2)
CO2: 23 mmol/L (ref 20–29)
Calcium: 9.8 mg/dL (ref 8.7–10.3)
Chloride: 100 mmol/L (ref 96–106)
Creatinine, Ser: 0.86 mg/dL (ref 0.57–1.00)
GFR calc Af Amer: 74 mL/min/{1.73_m2} (ref >59)
GFR calc non Af Amer: 64 mL/min/{1.73_m2} (ref >59)
Globulin, Total: 2.9 g/dL (ref 1.5–4.5)
Glucose: 82 mg/dL (ref 65–99)
Potassium: 4.5 mmol/L (ref 3.5–5.2)
Sodium: 140 mmol/L (ref 134–144)
Total Protein: 7.2 g/dL (ref 6.0–8.5)

## 2018-10-23 LAB — CBC
Hematocrit: 38.3 % (ref 34.0–46.6)
Hemoglobin: 12.9 g/dL (ref 11.1–15.9)
MCH: 29.3 pg (ref 26.6–33.0)
MCHC: 33.7 g/dL (ref 31.5–35.7)
MCV: 87 fL (ref 79–97)
Platelets: 273 10*3/uL (ref 150–450)
RBC: 4.4 x10E6/uL (ref 3.77–5.28)
RDW: 13.3 % (ref 11.7–15.4)
WBC: 6.6 10*3/uL (ref 3.4–10.8)

## 2018-10-23 LAB — HEMOGLOBIN A1C
Est. average glucose Bld gHb Est-mCnc: 123 mg/dL
Hgb A1c MFr Bld: 5.9 % — ABNORMAL HIGH (ref 4.8–5.6)

## 2018-10-23 LAB — TSH: TSH: 1.65 u[IU]/mL (ref 0.450–4.500)

## 2018-10-26 NOTE — Progress Notes (Signed)
Subjective:     Patient ID: Kayla Calhoun , female    DOB: 29-Apr-1941 , 78 y.o.   MRN: [id number]   Chief Complaint  Patient presents with  . Hypertension    HPI  Hypertension  This is a chronic problem. The current episode started more than 1 year ago. The problem is controlled. Pertinent negatives include no blurred vision, chest pain, palpitations or shortness of breath. Risk factors for coronary artery disease include obesity, sedentary lifestyle and post-menopausal state. Past treatments include calcium channel blockers, beta blockers and diuretics. The current treatment provides mild improvement. Hypertensive end-organ damage includes kidney disease.     Past Medical History:  Diagnosis Date  . Hypertension      Family History  Problem Relation Age of Onset  . Stroke Mother   . Prostate cancer Father   . Stroke Sister   . Hypertension Sister   . Hypertension Brother   . Stroke Brother      Current Outpatient Medications:  .  ALPHAGAN P 0.1 % SOLN, INSTILL 1 DROP INTO BOTH EYES EVERY 12 HOURS, Disp: , Rfl:  .  amLODipine (NORVASC) 5 MG tablet, TAKE 1 TABLET BY MOUTH EVERY DAY, Disp: 90 tablet, Rfl: 2 .  aspirin EC 81 MG tablet, Take 1 tablet (81 mg total) by mouth daily., Disp: 150 tablet, Rfl: 2 .  levobunolol (BETAGAN) 0.5 % ophthalmic solution, PLACE 1 DROP INTO BOTH EYES TWICE A DAY, Disp: , Rfl:  .  metoprolol succinate (TOPROL-XL) 25 MG 24 hr tablet, Take 25 mg by mouth daily., Disp: , Rfl:  .  TRAVATAN Z 0.004 % SOLN ophthalmic solution, , Disp: , Rfl:  .  triamterene-hydrochlorothiazide (MAXZIDE-25) 37.5-25 MG tablet, Take 1 tablet by mouth daily., Disp: 90 tablet, Rfl: 1   No Known Allergies   Review of Systems  Constitutional: Negative.   Eyes: Negative for blurred vision.  Respiratory: Negative.  Negative for shortness of breath.   Cardiovascular: Negative.  Negative for chest pain and palpitations.  Gastrointestinal: Negative.   Neurological:  Negative.   Psychiatric/Behavioral: Negative.      Today's Vitals   10/22/18 0950  BP: 138/78  Pulse: 72  Temp: 97.6 F (36.4 C)  TempSrc: Oral  Weight: 210 lb 3.2 oz (95.3 kg)  Height: 5' 0.2" (1.529 m)   Body mass index is 40.78 kg/m.   Objective:  Physical Exam Vitals signs and nursing note reviewed.  Constitutional:      Appearance: Normal appearance.  HENT:     Head: Normocephalic and atraumatic.  Cardiovascular:     Rate and Rhythm: Normal rate. Rhythm irregular.     Heart sounds: Normal heart sounds.  Pulmonary:     Effort: Pulmonary effort is normal.     Breath sounds: Normal breath sounds.  Skin:    General: Skin is warm.  Neurological:     General: No focal deficit present.     Mental Status: She is alert.  Psychiatric:        Mood and Affect: Mood normal.        Behavior: Behavior normal.         Assessment And Plan:     1. Hypertensive heart and renal disease with renal failure, stage 1 through stage 4 or unspecified chronic kidney disease, without heart failure  Fair control. She will continue with current meds. Pt is aware of optimal bp less than 120/80.  She is encouraged to avoid adding salt to her foods. She  will rto in six months for her next AWV.   - Lipid panel - CMP14+EGFR - TSH - CBC no Diff  2. Chronic atrial fibrillation  Chronic, yet stable. She is rate controlled.   3. Chronic renal disease, stage III (HCC)  Chronic, I will check a GFR, Cr today. She is encouraged to stay well hydrated.   4. Other abnormal glucose  HER A1C HAS BEEN ELEVATED IN THE PAST. I WILL CHECK AN A1C, BMET TODAY. SHE WAS ENCOURAGED TO AVOID SUGARY BEVERAGES AND PROCESSED FOODS INCLUDNG BREADS, RICE AND PASTA.  - Hemoglobin A1c  5. Estrogen deficiency  [facility]. She is encouraged to comply with calcium and vitamin D supplementation. Also encouraged to engage in weight-bearing exercises like  walking 61mn three days per week. She is encouraged to slowly work up to 30 minutes four to five days per week.   - DG Bone Density; Future  6. Class 3 severe obesity due to excess calories without serious comorbidity with body mass index (BMI) of 40.0 to 44.9 in adult ([facility]  Importance of achieving optimal weight to decrease risk of cardiovascular disease and cancers was discussed with the patient in full detail. She is encouraged to start slowly - start with 10 minutes twice daily at least three to four days per week and to gradually build to 30 minutes five days weekly. She was given tips to incorporate more activity into her daily routine - take stairs when possible, park farther away from grocery stores, etc.    RMaximino Greenland MD    THE PATIENT IS ENCOURAGED TO PRACTICE SOCIAL DISTANCING DUE TO THE COVID-19 PANDEMIC.

## 2018-10-31 ENCOUNTER — Other Ambulatory Visit: Payer: Self-pay | Admitting: Internal Medicine

## 2018-10-31 DIAGNOSIS — Z1231 Encounter for screening mammogram for malignant neoplasm of breast: Secondary | ICD-10-CM

## 2019-01-28 ENCOUNTER — Other Ambulatory Visit: Payer: Self-pay

## 2019-01-28 ENCOUNTER — Ambulatory Visit
Admission: RE | Admit: 2019-01-28 | Discharge: 2019-01-28 | Disposition: A | Discharge status: Home / Self Care | Payer: Medicare Other | Source: Ambulatory Visit | Attending: Internal Medicine | Admitting: Internal Medicine

## 2019-01-28 DIAGNOSIS — Z13820 Encounter for screening for osteoporosis: Secondary | ICD-10-CM | POA: Diagnosis not present

## 2019-01-28 DIAGNOSIS — Z780 Asymptomatic menopausal state: Secondary | ICD-10-CM | POA: Diagnosis not present

## 2019-01-28 DIAGNOSIS — E2839 Other primary ovarian failure: Secondary | ICD-10-CM

## 2019-01-28 DIAGNOSIS — Z1231 Encounter for screening mammogram for malignant neoplasm of breast: Secondary | ICD-10-CM | POA: Diagnosis not present

## 2019-01-29 ENCOUNTER — Other Ambulatory Visit: Payer: Self-pay

## 2019-02-03 DIAGNOSIS — H2513 Age-related nuclear cataract, bilateral: Secondary | ICD-10-CM | POA: Diagnosis not present

## 2019-02-03 DIAGNOSIS — H401112 Primary open-angle glaucoma, right eye, moderate stage: Secondary | ICD-10-CM | POA: Diagnosis not present

## 2019-02-03 DIAGNOSIS — H401123 Primary open-angle glaucoma, left eye, severe stage: Secondary | ICD-10-CM | POA: Diagnosis not present

## 2019-02-03 DIAGNOSIS — H52203 Unspecified astigmatism, bilateral: Secondary | ICD-10-CM | POA: Diagnosis not present

## 2019-02-04 ENCOUNTER — Telehealth: Payer: Self-pay

## 2019-02-04 NOTE — Telephone Encounter (Signed)
Left the patient a message to call back for bone density results. 

## 2019-02-04 NOTE — Telephone Encounter (Signed)
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 Hip is normal. She should engage in weight-bearing exercises two to three days weekly and take calcium/vit d supplements. Next bone density due in two years.

## 2019-03-12 ENCOUNTER — Telehealth: Payer: Self-pay | Admitting: Internal Medicine

## 2019-03-12 NOTE — Chronic Care Management (AMB) (Signed)
Chronic Care Management   Note  03/12/2019 Name: Kayla Calhoun MRN: [id number] DOB: 11-28-1940  Kayla Calhoun is a 78 y.o. year old female who is a primary care patient of Glendale Chard, MD. I reached out to [name] by phone today in response to a referral sent by [name]'s patient's health plan.     [name] was given information about Chronic Care Management services today including:  1. CCM service includes personalized support from designated clinical staff supervised by her physician, including individualized plan of care and coordination with other care providers 2. 24/7 contact phone numbers for assistance for urgent and routine care needs. 3. Service will only be billed when office clinical staff spend 20 minutes or more in a month to coordinate care. 4. Only one practitioner may furnish and bill the service in a calendar month. 5. The patient may stop CCM services at any time (effective at the end of the month) by phone call to the office staff. 6. The patient will be responsible for cost sharing (co-pay) of up to 20% of the service fee (after annual deductible is met).  Patient did not agree to enrollment in care management services and does not wish to consider at this time.  Follow up plan: The patient has been provided with contact information for the chronic care management team and has been advised to call with any health related questions or concerns.   [place]  [email or web address]   ??[phone]

## 2019-04-06 ENCOUNTER — Ambulatory Visit: Payer: Self-pay | Admitting: Cardiology

## 2019-04-16 ENCOUNTER — Ambulatory Visit: Payer: Self-pay | Admitting: Cardiology

## 2019-04-23 ENCOUNTER — Encounter: Payer: Self-pay | Admitting: Cardiology

## 2019-04-23 ENCOUNTER — Ambulatory Visit (INDEPENDENT_AMBULATORY_CARE_PROVIDER_SITE_OTHER): Payer: Medicare Other | Admitting: Cardiology

## 2019-04-23 ENCOUNTER — Other Ambulatory Visit: Payer: Self-pay

## 2019-04-23 VITALS — BP 127/91 | HR 97 | Ht 62.0 in | Wt 194.0 lb

## 2019-04-23 DIAGNOSIS — I4891 Unspecified atrial fibrillation: Secondary | ICD-10-CM | POA: Diagnosis not present

## 2019-04-23 DIAGNOSIS — I340 Nonrheumatic mitral (valve) insufficiency: Secondary | ICD-10-CM

## 2019-04-23 DIAGNOSIS — I10 Essential (primary) hypertension: Secondary | ICD-10-CM | POA: Insufficient documentation

## 2019-04-23 DIAGNOSIS — Z8679 Personal history of other diseases of the circulatory system: Secondary | ICD-10-CM

## 2019-04-23 MED ORDER — METOPROLOL SUCCINATE ER 50 MG PO TB24: 50.0000 mg | ORAL_TABLET | Freq: Every day | ORAL | 6 refills | Status: DC

## 2019-04-23 NOTE — Progress Notes (Signed)
Primary Physician:  Glendale Chard, MD   Patient ID: Kayla Calhoun, female    DOB: [birth year], 78 y.o.   MRN: [id number]  Subjective:    Chief Complaint  Patient presents with  . Atrial Fibrillation  . Hypertension  . Follow-up    HPI: Kayla Calhoun  is a 78 y.o. 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 She now presents for 1 year follow up.   Patient is essentially asymptomatic. She denies any complaints of palpitation, heart racing or irregular heartbeat. No history of dizziness, near-syncope or syncope. No complaints of chest pain, tightness or pressure. No shortness of breath, orthopnea or PND. No history of swelling on the legs and no claudication.  She has noticed her blood pressure being slightly high, but states that she has been out of her Metoprolol for the last 5 days.   She is not on anticoagulation in view of history of hemorrhage.   Past Medical History:  Diagnosis Date  . Hypertension     Past Surgical History:  Procedure Laterality Date  . ABDOMINAL HYSTERECTOMY    . KIDNEY SURGERY    . TONSILLECTOMY      Social History   Socioeconomic History  . Marital status: Widowed    Spouse name: Not on file  . Number of children: 1  . Years of education: Not on file  . Highest education level: Not on file  Occupational History  . Occupation: retired  Scientific laboratory technician  . Financial resource strain: Not hard at all  . Food insecurity    Worry: Never true    Inability: Never true  . Transportation needs    Medical: No    Non-medical: No  Tobacco Use  . Smoking status: Former Smoker    Years: 10.00    Types: Cigarettes  . Smokeless tobacco: Never Used  . Tobacco comment: been quit 35 years  Substance and Sexual Activity  . Alcohol use: Not Currently    Frequency: Never   . Drug use: Never  . Sexual activity: Not Currently  Lifestyle  . Physical activity    Days per week: 7 days    Minutes per session: 40 min  . Stress: Not at all  Relationships  . Social Herbalist on phone: Not on file    Gets together: Not on file    Attends religious service: Not on file    Active member of club or organization: Not on file    Attends meetings of clubs or organizations: Not on file    Relationship status: Not on file  . Intimate partner violence    Fear of current or ex partner: No    Emotionally abused: No    Physically abused: No    Forced sexual activity: No  Other Topics Concern  . Not on file  Social History Narrative  . Not on file    Review of Systems  Constitution: Negative for decreased appetite, malaise/fatigue, weight gain and weight loss.  Eyes: Negative for visual disturbance.  Cardiovascular: Negative for chest pain, claudication, dyspnea on exertion, leg swelling, orthopnea, palpitations and syncope.  Respiratory: Negative for hemoptysis and wheezing.   Endocrine: Negative for cold intolerance and heat intolerance.  Hematologic/Lymphatic: Does not bruise/bleed easily.  Skin: Negative for nail changes.  Musculoskeletal: Negative for muscle weakness and myalgias.  Gastrointestinal: Negative for abdominal  pain, change in bowel habit, nausea and vomiting.  Neurological: Negative for difficulty with concentration, dizziness, focal weakness and headaches.  Psychiatric/Behavioral: Negative for altered mental status and suicidal ideas.  All other systems reviewed and are negative.     Objective:  Blood pressure (!) 127/91, pulse 97, height 5\' 2"  (1.575 m), weight 194 lb (88 kg), SpO2 97 %. Body mass index is 35.48 kg/m.    Physical Exam  Constitutional: She is oriented to person, place, and time. Vital signs are normal. She appears well-developed and well-nourished.  HENT:  Head: Normocephalic and atraumatic.  Neck: Normal range  of motion.  Cardiovascular: Normal rate, regular rhythm, normal heart sounds and intact distal pulses.  Pulmonary/Chest: Effort normal and breath sounds normal. No accessory muscle usage. No respiratory distress.  Abdominal: Soft. Bowel sounds are normal.  Musculoskeletal: Normal range of motion.  Neurological: She is alert and oriented to person, place, and time.  Skin: Skin is warm and dry.  Vitals reviewed.  Radiology: No results found.  Laboratory examination:    CMP Latest Ref Rng & Units 10/22/2018 03/27/2018 01/09/2008  Glucose 65 - 99 mg/dL 82 98 161(W102(H)  BUN 8 - 27 mg/dL 14 15 15   Creatinine 0.57 - 1.00 mg/dL 9.600.86 4.540.96 0.980.51  Sodium 134 - 144 mmol/L 140 140 137  Potassium 3.5 - 5.2 mmol/L 4.5 4.5 3.9  Chloride 96 - 106 mmol/L 100 100 100  CO2 20 - 29 mmol/L 23 24 31   Calcium 8.7 - 10.3 mg/dL 9.8 9.5 8.7  Total Protein 6.0 - 8.5 g/dL 7.2 6.8 4.9(L)  Total Bilirubin 0.0 - 1.2 mg/dL 0.5 0.5 0.6  Alkaline Phos 39 - 117 IU/L 62 57 56  AST 0 - 40 IU/L 24 22 20   ALT 0 - 32 IU/L 13 14 37(H)   CBC Latest Ref Rng & Units 10/22/2018 01/09/2008 01/08/2008  WBC 3.4 - 10.8 x10E3/uL 6.6 7.9 9.0  Hemoglobin 11.1 - 15.9 g/dL 11.912.9 1.4(N9.4(L) 8.2(N9.5(L)  Hematocrit 34.0 - 46.6 % 38.3 29.0(L) 29.5(L)  Platelets 150 - 450 x10E3/uL 273 513(H) 571(H)   Lipid Panel     Component Value Date/Time   CHOL 187 10/22/2018 1651   TRIG 67 10/22/2018 1651   HDL 63 10/22/2018 1651   CHOLHDL 3.0 10/22/2018 1651   LDLCALC 111 (H) 10/22/2018 1651   HEMOGLOBIN A1C Lab Results  Component Value Date   HGBA1C 5.9 (H) 10/22/2018   MPG 154 01/09/2008   TSH Recent Labs    10/22/18 1651  TSH 1.650    PRN Meds:. Medications Discontinued During This Encounter  Medication Reason  . metoprolol succinate (TOPROL-XL) 50 MG 24 hr tablet Reorder   Current Meds  Medication Sig  . ALPHAGAN P 0.1 % SOLN INSTILL 1 DROP INTO BOTH EYES EVERY 12 HOURS  . amLODipine (NORVASC) 5 MG tablet TAKE 1 TABLET BY MOUTH EVERY  DAY  . aspirin EC 81 MG tablet Take 81 mg by mouth daily.  . Cholecalciferol (VITAMIN D3) 50 MCG (2000 UT) TABS Take by mouth daily.  Marland Kitchen. levobunolol (BETAGAN) 0.5 % ophthalmic solution PLACE 1 DROP INTO BOTH EYES TWICE A DAY  . metoprolol succinate (TOPROL-XL) 50 MG 24 hr tablet Take 1 tablet (50 mg total) by mouth daily.  . TRAVATAN Z 0.004 % SOLN ophthalmic solution   . triamterene-hydrochlorothiazide (MAXZIDE-25) 37.5-25 MG tablet Take 1 tablet by mouth daily.  . vitamin C (ASCORBIC ACID) 500 MG tablet Take 500 mg by mouth daily.  . [DISCONTINUED] metoprolol  succinate (TOPROL-XL) 50 MG 24 hr tablet Take 50 mg by mouth daily.     Cardiac Studies:   Echo- 09/26/2016 1. Left ventricle cavity is normal in size. Mild concentric hypertrophy of the left ventricle. Normal global wall motion. Calculated EF 56%. 2. Left atrial cavity is severely dilated. 3. Right atrial cavity is mildly dilated. 4. Mild to moderate aortic regurgitation. 5. Moderate (Grade III) mitral regurgitation. 6. Moderate tricuspid regurgitation. Mild pulmonary hypertension with approx. PA syst. pressure of 39 mm of Hg. 7. Trace pericardial effusion.  Lexiscan myoview stress test 09/24/2016: 1. The resting electrocardiogram demonstrated atrial fibrillation, normal resting conduction and normal rest repolarization. Stress EKG is non-diagnostic for ischemia as it a pharmacologic stress using Lexiscan. Stress symptoms included nausea. 2. SPECT images demonstrate homogeneous tracer distribution throughout the myocardium. Gated SPECT imaging reveals normal myocardial thickening and wall motion. The left ventricular ejection fraction was normal visually but calculated at (43%). Additionally, the right ventricle is normal. EF calculation may be an error due to difficulty in EKG gating due to A. Fibrillation. Low risk study.  Assessment:   Atrial fibrillation with controlled ventricular response (HCC) - Plan: EKG 12-Lead  Primary  hypertension  History of nontraumatic rupture of cerebral aneurysm  Moderate mitral insufficiency - Plan: PCV ECHOCARDIOGRAM COMPLETE   EKG 04/23/2019 - Atrial fibrillation with controlled ventricular response at 97 bpm, poor progression of R wave, cannot rule out old anteroseptal MI, low voltage in chest leads.  CHA2DS2-VASc Score is 3 with yearly risk of stroke of 3.2 %. (CHF; HTN; vasc disease DM, Female = 1; Age <65 =1; 65-74 = 2, >75 =3; stroke = 3). If stroke due to subarachnoid and intraventricular hemorrhage in 2009, brain aneurysm is considered as prior stroke then chads is 5..  Recommendations:   Patient is here on a 1 year office visit and follow up. She is doing well without any complaints.  Blood pressure and heart rate are slightly elevated today, but she has been out of her metoprolol for the last 5 days.  I have refilled this and advised her to continue with home monitoring and to notify me if this does not improve with resuming her metoprolol.  She has dyspnea on exertion with walking uphill that is overall stable.  In view of moderate MR, will repeat echocardiogram for continued surveillance.  I do not suspect any progression by physical exam.  She is not on anticoagulation in view of her history of subarachnoid hemorrhage.  I reviewed her recent labs, LDL continues to be slightly elevated.  She has made some changes to her diet and is to follow-up with her PCP in the next few months for follow-up.  Overall, patient is stable from a cardiac standpoint.  I will see her back in 1 year or sooner if problems.  Toniann Fail, MSN, APRN, FNP-C [facility] Office: [phone] Fax: [phone]

## 2019-04-28 ENCOUNTER — Encounter: Payer: Self-pay | Admitting: Internal Medicine

## 2019-04-28 ENCOUNTER — Other Ambulatory Visit: Payer: Self-pay

## 2019-04-28 ENCOUNTER — Ambulatory Visit (INDEPENDENT_AMBULATORY_CARE_PROVIDER_SITE_OTHER): Payer: Medicare Other | Admitting: Internal Medicine

## 2019-04-28 VITALS — BP 126/78 | HR 68 | Temp 97.6°F | Ht 62.0 in | Wt 194.2 lb

## 2019-04-28 DIAGNOSIS — I482 Chronic atrial fibrillation, unspecified: Secondary | ICD-10-CM | POA: Diagnosis not present

## 2019-04-28 DIAGNOSIS — Z6835 Body mass index (BMI) 35.0-35.9, adult: Secondary | ICD-10-CM | POA: Diagnosis not present

## 2019-04-28 DIAGNOSIS — I1310 Hypertensive heart and chronic kidney disease without heart failure, with stage 1 through stage 4 chronic kidney disease, or unspecified chronic kidney disease: Secondary | ICD-10-CM

## 2019-04-28 DIAGNOSIS — R7309 Other abnormal glucose: Secondary | ICD-10-CM | POA: Diagnosis not present

## 2019-04-28 DIAGNOSIS — N183 Chronic kidney disease, stage 3 unspecified: Secondary | ICD-10-CM

## 2019-04-28 MED ORDER — TRIAMTERENE-HCTZ 37.5-25 MG PO TABS: ORAL_TABLET | ORAL | 1 refills | Status: DC

## 2019-04-28 NOTE — Progress Notes (Signed)
Subjective:     Patient ID: Kayla Calhoun , female    DOB: [birth year] , 78 y.o.   MRN: [id number]   Chief Complaint  Patient presents with  . Hypertension    HPI  Hypertension This is a chronic problem. The current episode started more than 1 year ago. The problem has been gradually improving since onset. The problem is controlled. Pertinent negatives include no blurred vision, chest pain, palpitations or shortness of breath. Risk factors for coronary artery disease include obesity, post-menopausal state and sedentary lifestyle.     Past Medical History:  Diagnosis Date  . Hypertension      Family History  Problem Relation Age of Onset  . Stroke Mother   . Prostate cancer Father   . Stroke Sister   . Hypertension Sister   . Hypertension Brother   . Stroke Brother      Current Outpatient Medications:  .  amLODipine (NORVASC) 5 MG tablet, TAKE 1 TABLET BY MOUTH EVERY DAY, Disp: 90 tablet, Rfl: 2 .  aspirin EC 81 MG tablet, Take 81 mg by mouth daily., Disp: , Rfl:  .  Cholecalciferol (VITAMIN D3) 50 MCG (2000 UT) TABS, Take by mouth daily., Disp: , Rfl:  .  levobunolol (BETAGAN) 0.5 % ophthalmic solution, PLACE 1 DROP INTO BOTH EYES TWICE A DAY, Disp: , Rfl:  .  metoprolol succinate (TOPROL-XL) 50 MG 24 hr tablet, Take 1 tablet (50 mg total) by mouth daily., Disp: 90 tablet, Rfl: 6 .  TRAVATAN Z 0.004 % SOLN ophthalmic solution, , Disp: , Rfl:  .  triamterene-hydrochlorothiazide (MAXZIDE-25) 37.5-25 MG tablet, Take 1 tablet by mouth daily., Disp: 90 tablet, Rfl: 1 .  vitamin C (ASCORBIC ACID) 500 MG tablet, Take 500 mg by mouth daily., Disp: , Rfl:    No Known Allergies   Review of Systems  Constitutional: Negative.   Eyes: Negative for blurred vision.  Respiratory: Negative.  Negative for shortness of breath.   Cardiovascular: Negative.  Negative for chest pain and palpitations.  Gastrointestinal: Negative.   Neurological: Negative.   Psychiatric/Behavioral:  Negative.      Today's Vitals   04/28/19 0915  BP: 126/78  Pulse: 68  Temp: 97.6 F (36.4 C)  TempSrc: Oral  Weight: 194 lb 3.2 oz (88.1 kg)  Height: 5' 2"  (1.575 m)  PainSc: 0-No pain   Body mass index is 35.52 kg/m.   Objective:  Physical Exam Vitals signs and nursing note reviewed.  Constitutional:      Appearance: Normal appearance.  HENT:     Head: Normocephalic and atraumatic.  Cardiovascular:     Rate and Rhythm: Normal rate. Rhythm irregular.     Heart sounds: Normal heart sounds.  Pulmonary:     Effort: Pulmonary effort is normal.     Breath sounds: Normal breath sounds.  Skin:    General: Skin is warm.  Neurological:     General: No focal deficit present.     Mental Status: She is alert.  Psychiatric:        Mood and Affect: Mood normal.        Behavior: Behavior normal.         Assessment And Plan:     1. Hypertensive heart and renal disease with renal failure, stage 1 through stage 4 or unspecified chronic kidney disease, without heart failure  Chronic, well controlled. She will continue with current meds. She is encouraged to avoid adding salt to her foods. I will check renal  function today.   - BMP8+EGFR  2. Chronic atrial fibrillation (HCC)  Chronic, yet stable. She is rate-controlled. She will continue with current meds and regular f/u with Cardiology.   3. Stage 3 chronic kidney disease, unspecified whether stage 3a or 3b CKD  Chronic, yet stable. Importance of adequate hydration was discussed with the patient. I will check GFR, Cr today.   4. Other abnormal glucose  HER A1C HAS BEEN ELEVATED IN THE PAST. I WILL CHECK AN A1C, BMET TODAY. SHE WAS ENCOURAGED TO AVOID SUGARY BEVERAGES AND PROCESSED FOODS INCLUDNG BREADS, RICE AND PASTA.  - Hemoglobin A1c  5. 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 She is encouraged to keep up the great work. She is encouraged to strive for BMI less than 30 to decrease cardiac risk.     [name], MD    THE PATIENT IS ENCOURAGED TO PRACTICE SOCIAL DISTANCING DUE TO THE COVID-19 PANDEMIC.

## 2019-04-28 NOTE — Patient Instructions (Signed)
Pneumococcal Conjugate Vaccine suspension for injection What is this medicine? PNEUMOCOCCAL VACCINE (NEU mo KOK al vak SEEN) is a vaccine used to prevent pneumococcus bacterial infections. These bacteria can cause serious infections like pneumonia, meningitis, and blood infections. This vaccine will lower your chance of getting pneumonia. If you do get pneumonia, it can make your symptoms milder and your illness shorter. This vaccine will not treat an infection and will not cause infection. This vaccine is recommended for infants and young children, adults with certain medical conditions, and adults 65 years or older. This medicine may be used for other purposes; ask your health care provider or pharmacist if you have questions. COMMON BRAND NAME(S): Prevnar, Prevnar 13 What should I tell my health care provider before I take this medicine? They need to know if you have any of these conditions:  bleeding problems  fever  immune system problems  an unusual or allergic reaction to pneumococcal vaccine, diphtheria toxoid, other vaccines, latex, other medicines, foods, dyes, or preservatives  pregnant or trying to get pregnant  breast-feeding How should I use this medicine? This vaccine is for injection into a muscle. It is given by a health care professional. A copy of Vaccine Information Statements will be given before each vaccination. Read this sheet carefully each time. The sheet may change frequently. Talk to your pediatrician regarding the use of this medicine in children. While this drug may be prescribed for children as young as 6 weeks old for selected conditions, precautions do apply. Overdosage: If you think you have taken too much of this medicine contact a poison control center or emergency room at once. NOTE: This medicine is only for you. Do not share this medicine with others. What if I miss a dose? It is important not to miss your dose. Call your doctor or health care  professional if you are unable to keep an appointment. What may interact with this medicine?  medicines for cancer chemotherapy  medicines that suppress your immune function  steroid medicines like prednisone or cortisone This list may not describe all possible interactions. Give your health care provider a list of all the medicines, herbs, non-prescription drugs, or dietary supplements you use. Also tell them if you smoke, drink alcohol, or use illegal drugs. Some items may interact with your medicine. What should I watch for while using this medicine? Mild fever and pain should go away in 3 days or less. Report any unusual symptoms to your doctor or health care professional. What side effects may I notice from receiving this medicine? Side effects that you should report to your doctor or health care professional as soon as possible:  allergic reactions like skin rash, itching or hives, swelling of the face, lips, or tongue  breathing problems  confused  fast or irregular heartbeat  fever over 102 degrees F  seizures  unusual bleeding or bruising  unusual muscle weakness Side effects that usually do not require medical attention (report to your doctor or health care professional if they continue or are bothersome):  aches and pains  diarrhea  fever of 102 degrees F or less  headache  irritable  loss of appetite  pain, tender at site where injected  trouble sleeping This list may not describe all possible side effects. Call your doctor for medical advice about side effects. You may report side effects to FDA at 1-800-FDA-1088. Where should I keep my medicine? This does not apply. This vaccine is given in a clinic, pharmacy, doctor's office,   or other health care setting and will not be stored at home. NOTE: This sheet is a summary. It may not cover all possible information. If you have questions about this medicine, talk to your doctor, pharmacist, or health care  provider.  2020 Elsevier/Gold Standard (2014-03-11 10:27:27)  

## 2019-04-29 LAB — BMP8+EGFR
BUN/Creatinine Ratio: 18 (ref 12–28)
BUN: 17 mg/dL (ref 8–27)
CO2: 24 mmol/L (ref 20–29)
Calcium: 10 mg/dL (ref 8.7–10.3)
Chloride: 104 mmol/L (ref 96–106)
Creatinine, Ser: 0.95 mg/dL (ref 0.57–1.00)
GFR calc Af Amer: 65 mL/min/{1.73_m2} (ref >59)
GFR calc non Af Amer: 57 mL/min/{1.73_m2} — ABNORMAL LOW (ref >59)
Glucose: 89 mg/dL (ref 65–99)
Potassium: 4.5 mmol/L (ref 3.5–5.2)
Sodium: 143 mmol/L (ref 134–144)

## 2019-04-29 LAB — HEMOGLOBIN A1C
Est. average glucose Bld gHb Est-mCnc: 120 mg/dL
Hgb A1c MFr Bld: 5.8 % — ABNORMAL HIGH (ref 4.8–5.6)

## 2019-05-04 ENCOUNTER — Other Ambulatory Visit: Payer: Self-pay

## 2019-05-04 ENCOUNTER — Ambulatory Visit (INDEPENDENT_AMBULATORY_CARE_PROVIDER_SITE_OTHER): Payer: Medicare Other

## 2019-05-04 ENCOUNTER — Other Ambulatory Visit: Payer: Medicare Other

## 2019-05-04 DIAGNOSIS — I340 Nonrheumatic mitral (valve) insufficiency: Secondary | ICD-10-CM

## 2019-05-12 ENCOUNTER — Encounter: Payer: Self-pay | Admitting: Internal Medicine

## 2019-05-19 ENCOUNTER — Telehealth: Payer: Self-pay | Admitting: Cardiology

## 2019-05-20 ENCOUNTER — Telehealth: Payer: Self-pay

## 2019-05-20 NOTE — Telephone Encounter (Signed)
Pt calling wanting results for echo done on 11/16. Do not see where it has been resulted. Please review and advise.//ah

## 2019-05-20 NOTE — Telephone Encounter (Signed)
Discussed echocardiogram results with the patient. She has had slight progression of mitral and tricuspid valve regurgitation. She is asymptomatic and walks regularly. Will continue with close observation. I will change her 1 year follow up to 6 month follow up and will consider echocardiogram at that time.  

## 2019-05-22 ENCOUNTER — Other Ambulatory Visit: Payer: Self-pay | Admitting: Internal Medicine

## 2019-05-28 ENCOUNTER — Other Ambulatory Visit: Payer: Self-pay

## 2019-05-28 ENCOUNTER — Encounter: Payer: Self-pay | Admitting: Internal Medicine

## 2019-05-28 ENCOUNTER — Ambulatory Visit (INDEPENDENT_AMBULATORY_CARE_PROVIDER_SITE_OTHER): Payer: Medicare Other | Admitting: Internal Medicine

## 2019-05-28 VITALS — BP 132/68 | HR 102 | Temp 97.6°F | Ht 62.0 in | Wt 195.2 lb

## 2019-05-28 DIAGNOSIS — I1310 Hypertensive heart and chronic kidney disease without heart failure, with stage 1 through stage 4 chronic kidney disease, or unspecified chronic kidney disease: Secondary | ICD-10-CM

## 2019-05-28 DIAGNOSIS — I4892 Unspecified atrial flutter: Secondary | ICD-10-CM | POA: Diagnosis not present

## 2019-05-28 DIAGNOSIS — R0789 Other chest pain: Secondary | ICD-10-CM

## 2019-05-28 DIAGNOSIS — Z79899 Other long term (current) drug therapy: Secondary | ICD-10-CM | POA: Diagnosis not present

## 2019-05-28 DIAGNOSIS — I4891 Unspecified atrial fibrillation: Secondary | ICD-10-CM | POA: Diagnosis not present

## 2019-05-28 DIAGNOSIS — I10 Essential (primary) hypertension: Secondary | ICD-10-CM | POA: Diagnosis not present

## 2019-05-28 DIAGNOSIS — G4710 Hypersomnia, unspecified: Secondary | ICD-10-CM

## 2019-05-28 LAB — POCT URINALYSIS DIPSTICK
Bilirubin, UA: NEGATIVE
Glucose, UA: NEGATIVE
Ketones, UA: 15
Nitrite, UA: NEGATIVE
Protein, UA: NEGATIVE
Spec Grav, UA: 1.025 (ref 1.010–1.025)
Urobilinogen, UA: 0.2 E.U./dL
pH, UA: 7 (ref 5.0–8.0)

## 2019-05-28 NOTE — Progress Notes (Signed)
This visit occurred during the SARS-CoV-2 public health emergency.  Safety protocols were in place, including screening questions prior to the visit, additional usage of staff PPE, and extensive cleaning of exam room while observing appropriate contact time as indicated for disinfecting solutions.  Subjective:     Patient ID: Kayla Calhoun , female    DOB: [birth year] , 78 y.o.   MRN: [id number]   Chief Complaint  Patient presents with  . Chest Pain  . Insomnia    HPI Pt is here brought in by her daughter due to pt falling asleep all the time today and has been having a dull chest pain on her L chest since yesterday and has been voiding every 20 minutes today. Pt denies dysuria, polyuria or polydipsia.  The L chest pain last night  kept her awake. She did not seek care or call her daughter til this am. Pt denies tightness of chest or SOB at rest. Does admit of SOB with walking x 2 weeks specially up hill which she has not had in the past. Saw a cardiologist NP last month and had echo which was reviewed by NP with her and was placed on aspirin.     Past Medical History:  Diagnosis Date  . Hypertension      Family History  Problem Relation Age of Onset  . Stroke Mother   . Prostate cancer Father   . Stroke Sister   . Hypertension Sister   . Hypertension Brother   . Stroke Brother      Current Outpatient Medications:  .  amLODipine (NORVASC) 5 MG tablet, TAKE 1 TABLET BY MOUTH EVERY DAY, Disp: 90 tablet, Rfl: 0 .  aspirin EC 81 MG tablet, Take 81 mg by mouth daily., Disp: , Rfl:  .  Cholecalciferol (VITAMIN D3) 50 MCG (2000 UT) TABS, Take by mouth daily., Disp: , Rfl:  .  levobunolol (BETAGAN) 0.5 % ophthalmic solution, PLACE 1 DROP INTO BOTH EYES TWICE A DAY, Disp: , Rfl:  .  metoprolol succinate (TOPROL-XL) 50 MG 24 hr tablet, Take 1 tablet (50 mg total) by mouth daily., Disp: 90 tablet, Rfl: 6 .  TRAVATAN Z 0.004 % SOLN ophthalmic solution, , Disp: , Rfl:  .   triamterene-hydrochlorothiazide (MAXZIDE-25) 37.5-25 MG tablet, Take 1/2 tab po qd, Disp: 90 tablet, Rfl: 1 .  vitamin C (ASCORBIC ACID) 500 MG tablet, Take 500 mg by mouth daily., Disp: , Rfl:    No Known Allergies   Review of Systems  Daughter states pt rarely snores, an on occasion falls asleep during the day, but nothing like today. Pt has not slept last night. + chest pain, and SOB with activity going up a hill x 2 weeks. Snores only on occasion. The rest of 10 point ROS is neg.  Today's Vitals   05/28/19 1713  BP: 132/68  Pulse: (!) 102  Temp: 97.6 F (36.4 C)  TempSrc: Oral  Weight: 195 lb 3.2 oz (88.5 kg)  Height: 5\' 2"  (1.575 m)   Body mass index is 35.7 kg/m.   Objective:  Physical Exam Vitals and nursing note reviewed.  Constitutional:      General: She is not in acute distress.    Appearance: She is obese. She is not toxic-appearing.     Comments: She looks tired but she did not fall asleep while I was there  Neck:     Thyroid: No thyromegaly.  Cardiovascular:     Rate and Rhythm: Tachycardia present. Rhythm  irregular.     Comments: No bruits heard Pulmonary:     Effort: Pulmonary effort is normal.     Breath sounds: Normal breath sounds.  Chest:     Chest wall: No tenderness.  Musculoskeletal:        General: Normal range of motion.     Cervical back: Normal range of motion and neck supple.     Right lower leg: No edema.     Left lower leg: No edema.  Lymphadenopathy:     Cervical: No cervical adenopathy.  Skin:    General: Skin is warm.     Findings: No rash.  Neurological:     Mental Status: She is alert and oriented to person, place, and time.     Comments: No facial asymmetry, speech is normal  Psychiatric:        Mood and Affect: Mood normal.        Behavior: Behavior normal. Behavior is not agitated.     EKG reviewed by Dr Baird Cancer and sees some artifact and Afib. Rate is 115.    UA- shows trace leuks, but was collected at home in a bag.   Assessment And Plan:     1. Hypersomnia- could be from lack of sleep last night.  - Magnesium - TSH  2. 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 - CMP14 + Anion Gap    Dr Baird Cancer recommended to increase her Toprol to 25 mg in am, and 50 mg qhs. Needs to FU next week.  EKG copy was faxed to her cardiologist.  Daughter and pt told if the chest pain persists or she gets worse, she needs to go to ER.  We will inform her about her labs when back. The CBC was not done since it clotted and labs have been picked up.  Her daughter will stay the night with pt and pt should not drive til we or cardiology sees her back.   Cayson Kalb RODRIGUEZ-SOUTHWORTH, PA-C    THE PATIENT IS ENCOURAGED TO PRACTICE SOCIAL DISTANCING DUE TO THE COVID-19 PANDEMIC.

## 2019-05-29 LAB — CMP14 + ANION GAP
ALT: 11 IU/L (ref 0–32)
AST: 23 IU/L (ref 0–40)
Albumin/Globulin Ratio: 1.7 (ref 1.2–2.2)
Albumin: 4.5 g/dL (ref 3.7–4.7)
Alkaline Phosphatase: 79 IU/L (ref 39–117)
Anion Gap: 16 mmol/L (ref 10.0–18.0)
BUN/Creatinine Ratio: 10 — ABNORMAL LOW (ref 12–28)
BUN: 7 mg/dL — ABNORMAL LOW (ref 8–27)
Bilirubin Total: 0.8 mg/dL (ref 0.0–1.2)
CO2: 23 mmol/L (ref 20–29)
Calcium: 9.9 mg/dL (ref 8.7–10.3)
Chloride: 94 mmol/L — ABNORMAL LOW (ref 96–106)
Creatinine, Ser: 0.67 mg/dL (ref 0.57–1.00)
GFR calc Af Amer: 96 mL/min/{1.73_m2} (ref >59)
GFR calc non Af Amer: 83 mL/min/{1.73_m2} (ref >59)
Globulin, Total: 2.7 g/dL (ref 1.5–4.5)
Glucose: 111 mg/dL — ABNORMAL HIGH (ref 65–99)
Potassium: 4.1 mmol/L (ref 3.5–5.2)
Sodium: 133 mmol/L — ABNORMAL LOW (ref 134–144)
Total Protein: 7.2 g/dL (ref 6.0–8.5)

## 2019-05-29 LAB — MAGNESIUM: Magnesium: 1.7 mg/dL (ref 1.6–2.3)

## 2019-05-29 LAB — TSH: TSH: 1.38 u[IU]/mL (ref 0.450–4.500)

## 2019-06-04 ENCOUNTER — Other Ambulatory Visit: Payer: Self-pay

## 2019-06-04 ENCOUNTER — Ambulatory Visit (INDEPENDENT_AMBULATORY_CARE_PROVIDER_SITE_OTHER): Payer: Medicare Other | Admitting: Internal Medicine

## 2019-06-04 VITALS — BP 130/74 | HR 78 | Temp 97.9°F | Ht 60.2 in | Wt 190.2 lb

## 2019-06-04 DIAGNOSIS — I10 Essential (primary) hypertension: Secondary | ICD-10-CM

## 2019-06-04 DIAGNOSIS — E871 Hypo-osmolality and hyponatremia: Secondary | ICD-10-CM | POA: Diagnosis not present

## 2019-06-04 DIAGNOSIS — I4892 Unspecified atrial flutter: Secondary | ICD-10-CM | POA: Diagnosis not present

## 2019-06-04 DIAGNOSIS — R7309 Other abnormal glucose: Secondary | ICD-10-CM

## 2019-06-04 MED ORDER — METOPROLOL SUCCINATE ER 50 MG PO TB24: ORAL_TABLET | ORAL | 1 refills | Status: DC

## 2019-06-04 NOTE — Progress Notes (Signed)
This visit occurred during the SARS-CoV-2 public health emergency.  Safety protocols were in place, including screening questions prior to the visit, additional usage of staff PPE, and extensive cleaning of exam room while observing appropriate contact time as indicated for disinfecting solutions.  Subjective:     Patient ID: Kayla Calhoun , female    DOB: 1940/10/17 , 78 y.o.   MRN: [id number]   Chief Complaint  Patient presents with  . Dysuria    f/u    HPI   Pt is here for FU chest pain, labs  and and increase of metoprolol dose. She states that her CP resolved by the next day. Has not been having hypersomnia. She feels well. Her daughter would like pt to be referred to cardiology, since her prior one retired.   Past Medical History:  Diagnosis Date  . Hypertension      Family History  Problem Relation Age of Onset  . Stroke Mother   . Prostate cancer Father   . Stroke Sister   . Hypertension Sister   . Hypertension Brother   . Stroke Brother      Current Outpatient Medications:  .  amLODipine (NORVASC) 5 MG tablet, TAKE 1 TABLET BY MOUTH EVERY DAY, Disp: 90 tablet, Rfl: 0 .  aspirin EC 81 MG tablet, Take 81 mg by mouth daily., Disp: , Rfl:  .  Cholecalciferol (VITAMIN D3) 50 MCG (2000 UT) TABS, Take by mouth daily., Disp: , Rfl:  .  levobunolol (BETAGAN) 0.5 % ophthalmic solution, PLACE 1 DROP INTO BOTH EYES TWICE A DAY, Disp: , Rfl:  .  metoprolol succinate (TOPROL-XL) 50 MG 24 hr tablet, Take 1 tablet (50 mg total) by mouth daily., Disp: 90 tablet, Rfl: 6 .  TRAVATAN Z 0.004 % SOLN ophthalmic solution, , Disp: , Rfl:  .  triamterene-hydrochlorothiazide (MAXZIDE-25) 37.5-25 MG tablet, Take 1/2 tab po qd, Disp: 90 tablet, Rfl: 1 .  vitamin C (ASCORBIC ACID) 500 MG tablet, Take 500 mg by mouth daily., Disp: , Rfl:    No Known Allergies   Review of Systems  + for knee joint aches, chronic lower leg swelling with no edema, the rest of 10 point ROS is neg.  Today's  Vitals   06/04/19 1638  BP: 130/74  Pulse: 78  Temp: 97.9 F (36.6 C)  TempSrc: Oral  SpO2: 90%  Weight: 190 lb 3.2 oz (86.3 kg)  Height: 5' 0.2" (1.529 m)   Body mass index is 36.9 kg/m.   Objective:  Physical Exam   Constitutional: She is oriented to person, place, and time. She appears well-developed and well-nourished. No distress.  HENT:  Head: Normocephalic and atraumatic.  Right Ear: External ear normal.  Left Ear: External ear normal.  Nose: Nose normal.  Eyes: Conjunctivae are normal. Right eye exhibits no discharge. Left eye exhibits no discharge. No scleral icterus.  Neck: Neck supple. No thyromegaly present.  No carotid bruits bilaterally  Cardiovascular: Normal rate IRR No murmur heard. Pulmonary/Chest: Effort normal and breath sounds normal. No respiratory distress.  Musculoskeletal: Normal range of motion. She exhibits no edema.  Lymphadenopathy:    She has no cervical adenopathy.  Neurological: She is alert and oriented to person, place, and time.  Skin: Skin is warm and dry. Capillary refill takes less than 2 seconds. No rash noted. She is not diaphoretic.  Psychiatric: She has a normal mood and affect. Her behavior is normal. Judgment and thought content normal.  Nursing note reviewed.  Assessment And Plan:    1. Atrial flutter, unspecified type ([place])- with contralled rate. May continue Metoprolol 75 mg qd. - Ambulatory referral to Cardiology 2. Hyponatremia- new. All her labs were normal from last time, except slightly low [place] and sodium.  3. HTN- stable. May continue current treatment. FU as scheduled - CMP14 + Anion Gap    Sierah Lacewell RODRIGUEZ-SOUTHWORTH, PA-C    THE PATIENT IS ENCOURAGED TO PRACTICE SOCIAL DISTANCING DUE TO THE COVID-19 PANDEMIC.

## 2019-06-04 NOTE — Telephone Encounter (Signed)
Discussed echocardiogram results with the patient. She has had slight progression of mitral and tricuspid valve regurgitation. She is asymptomatic and walks regularly. Will continue with close observation. I will change her 1 year follow up to 6 month follow up and will consider echocardiogram at that time.

## 2019-06-05 ENCOUNTER — Encounter: Payer: Self-pay | Admitting: Internal Medicine

## 2019-06-09 LAB — CMP14 + ANION GAP
ALT: 15 IU/L (ref 0–32)
AST: 19 IU/L (ref 0–40)
Albumin/Globulin Ratio: 1.5 (ref 1.2–2.2)
Albumin: 4 g/dL (ref 3.7–4.7)
Alkaline Phosphatase: 71 IU/L (ref 39–117)
Anion Gap: 13 mmol/L (ref 10.0–18.0)
BUN/Creatinine Ratio: 19 (ref 12–28)
BUN: 18 mg/dL (ref 8–27)
Bilirubin Total: 0.3 mg/dL (ref 0.0–1.2)
CO2: 27 mmol/L (ref 20–29)
Calcium: 9.5 mg/dL (ref 8.7–10.3)
Chloride: 101 mmol/L (ref 96–106)
Creatinine, Ser: 0.96 mg/dL (ref 0.57–1.00)
GFR calc Af Amer: 65 mL/min/{1.73_m2} (ref >59)
GFR calc non Af Amer: 56 mL/min/{1.73_m2} — ABNORMAL LOW (ref >59)
Globulin, Total: 2.7 g/dL (ref 1.5–4.5)
Glucose: 99 mg/dL (ref 65–99)
Potassium: 4.9 mmol/L (ref 3.5–5.2)
Sodium: 141 mmol/L (ref 134–144)
Total Protein: 6.7 g/dL (ref 6.0–8.5)

## 2019-06-10 ENCOUNTER — Telehealth: Payer: Self-pay

## 2019-06-10 NOTE — Telephone Encounter (Signed)
Spoke w/pt  Rodriguez-Southworth, Sunday Spillers, PA-C  Candiss Norse T, CMA  Please inform pt that her chemistry shows her sodium and chloride are still little low. Needs to have few salty snacks a day. Her kidney and liver function are normal

## 2019-06-18 ENCOUNTER — Ambulatory Visit: Payer: Medicare Other | Admitting: Internal Medicine

## 2019-06-21 ENCOUNTER — Other Ambulatory Visit: Payer: Self-pay | Admitting: Internal Medicine

## 2019-07-27 ENCOUNTER — Ambulatory Visit (INDEPENDENT_AMBULATORY_CARE_PROVIDER_SITE_OTHER): Payer: Medicare Other | Admitting: Cardiology

## 2019-07-27 ENCOUNTER — Encounter: Payer: Self-pay | Admitting: Cardiology

## 2019-07-27 ENCOUNTER — Encounter (INDEPENDENT_AMBULATORY_CARE_PROVIDER_SITE_OTHER): Payer: Self-pay

## 2019-07-27 ENCOUNTER — Other Ambulatory Visit: Payer: Self-pay

## 2019-07-27 VITALS — BP 126/80 | HR 54 | Ht 60.2 in | Wt 190.0 lb

## 2019-07-27 DIAGNOSIS — I340 Nonrheumatic mitral (valve) insufficiency: Secondary | ICD-10-CM

## 2019-07-27 DIAGNOSIS — I10 Essential (primary) hypertension: Secondary | ICD-10-CM

## 2019-07-27 DIAGNOSIS — I4891 Unspecified atrial fibrillation: Secondary | ICD-10-CM

## 2019-07-27 NOTE — Patient Instructions (Signed)
Medication Instructions:  The current medical regimen is effective;  continue present plan and medications.  *If you need a refill on your cardiac medications before your next appointment, please call your pharmacy*  Testing/Procedures: Your physician has requested that you have an echocardiogram in 6 months (01/2020). Echocardiography is a painless test that uses sound waves to create images of your heart. It provides your doctor with information about the size and shape of your heart and how well your heart's chambers and valves are working. This procedure takes approximately one hour. There are no restrictions for this procedure.  Follow-Up: At [facility], you and your health needs are our priority.  As part of our continuing mission to provide you with exceptional heart care, we have created designated Provider Care Teams.  These Care Teams include your primary Cardiologist (physician) and Advanced Practice Providers (APPs -  Physician Assistants and Nurse Practitioners) who all work together to provide you with the care you need, when you need it.  Your next appointment:   6 month(s)  The format for your next appointment:   In Person  Provider:   Donato Schultz, MD  Thank you for choosing [facility]!!
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
## 2019-08-20 ENCOUNTER — Ambulatory Visit: Payer: Medicare Other | Attending: Internal Medicine

## 2019-08-20 DIAGNOSIS — Z23 Encounter for immunization: Secondary | ICD-10-CM

## 2019-08-20 NOTE — Progress Notes (Signed)
   Covid-19 Vaccination Clinic  Name:  Kayla Calhoun    MRN: [id number] DOB: 09-Nov-1940  08/20/2019  Kayla Calhoun was observed post Covid-19 immunization for 15 minutes without incident. She was provided with Vaccine Information Sheet and instruction to access the V-Safe system.   Kayla Calhoun was instructed to call 911 with any severe reactions post vaccine: Marland Kitchen Difficulty breathing  . Swelling of face and throat  . A fast heartbeat  . A bad rash all over body  . Dizziness and weakness   Immunizations Administered    Name Date Dose VIS Date Route   Pfizer COVID-19 Vaccine 08/20/2019  9:51 AM 0.3 mL 05/29/2019 Intramuscular   Manufacturer: [employer], [employer]   Lot: [id number]   NDC: [address]-3

## 2019-09-15 ENCOUNTER — Ambulatory Visit: Payer: Medicare Other | Attending: Internal Medicine

## 2019-09-15 DIAGNOSIS — Z23 Encounter for immunization: Secondary | ICD-10-CM

## 2019-09-15 NOTE — Progress Notes (Signed)
   Covid-19 Vaccination Clinic  Name:  Kayla Calhoun    MRN: [id number] DOB: 1940-11-05  09/15/2019  Kayla Calhoun was observed post Covid-19 immunization for 15 minutes without incident. She was provided with Vaccine Information Sheet and instruction to access the V-Safe system.   Kayla Calhoun was instructed to call 911 with any severe reactions post vaccine: Marland Kitchen Difficulty breathing  . Swelling of face and throat  . A fast heartbeat  . A bad rash all over body  . Dizziness and weakness   Immunizations Administered    Name Date Dose VIS Date Route   Pfizer COVID-19 Vaccine 09/15/2019  2:13 PM 0.3 mL 05/29/2019 Intramuscular   Manufacturer: [employer], [employer]   Lot: [id number]   NDC: [address]-8

## 2019-09-21 DIAGNOSIS — M25562 Pain in left knee: Secondary | ICD-10-CM | POA: Diagnosis not present

## 2019-10-06 DIAGNOSIS — H401123 Primary open-angle glaucoma, left eye, severe stage: Secondary | ICD-10-CM | POA: Diagnosis not present

## 2019-10-06 DIAGNOSIS — H401112 Primary open-angle glaucoma, right eye, moderate stage: Secondary | ICD-10-CM | POA: Diagnosis not present

## 2019-10-26 ENCOUNTER — Ambulatory Visit: Payer: Medicare Other | Admitting: Internal Medicine

## 2019-10-28 ENCOUNTER — Ambulatory Visit (INDEPENDENT_AMBULATORY_CARE_PROVIDER_SITE_OTHER): Payer: Medicare Other

## 2019-10-28 ENCOUNTER — Other Ambulatory Visit: Payer: Self-pay

## 2019-10-28 ENCOUNTER — Encounter: Payer: Self-pay | Admitting: Internal Medicine

## 2019-10-28 ENCOUNTER — Ambulatory Visit (INDEPENDENT_AMBULATORY_CARE_PROVIDER_SITE_OTHER): Payer: Medicare Other | Admitting: Internal Medicine

## 2019-10-28 VITALS — BP 128/84 | HR 80 | Temp 97.8°F | Ht 60.2 in | Wt 185.0 lb

## 2019-10-28 VITALS — BP 128/84 | HR 80 | Temp 97.8°F | Ht 60.2 in | Wt 185.6 lb

## 2019-10-28 DIAGNOSIS — I1310 Hypertensive heart and chronic kidney disease without heart failure, with stage 1 through stage 4 chronic kidney disease, or unspecified chronic kidney disease: Secondary | ICD-10-CM | POA: Diagnosis not present

## 2019-10-28 DIAGNOSIS — I4819 Other persistent atrial fibrillation: Secondary | ICD-10-CM | POA: Diagnosis not present

## 2019-10-28 DIAGNOSIS — Z23 Encounter for immunization: Secondary | ICD-10-CM | POA: Diagnosis not present

## 2019-10-28 DIAGNOSIS — E66812 Obesity, class 2: Secondary | ICD-10-CM

## 2019-10-28 DIAGNOSIS — Z6835 Body mass index (BMI) 35.0-35.9, adult: Secondary | ICD-10-CM | POA: Diagnosis not present

## 2019-10-28 DIAGNOSIS — R7309 Other abnormal glucose: Secondary | ICD-10-CM | POA: Diagnosis not present

## 2019-10-28 DIAGNOSIS — Z0000 Encounter for general adult medical examination without abnormal findings: Secondary | ICD-10-CM | POA: Diagnosis not present

## 2019-10-28 MED ORDER — PREVNAR 13 IM SUSP: 0.5000 mL | INTRAMUSCULAR | 0 refills | Status: AC

## 2019-10-28 MED ORDER — AMLODIPINE BESYLATE 5 MG PO TABS: 5.0000 mg | ORAL_TABLET | Freq: Every day | ORAL | 2 refills | Status: DC

## 2019-10-28 NOTE — Patient Instructions (Signed)
Kayla Calhoun , Thank you for taking time to come for your Medicare Wellness Visit. I appreciate your ongoing commitment to your health goals. Please review the following plan we discussed and let me know if I can assist you in the future.   Screening recommendations/referrals: Colonoscopy: not required Mammogram: 01/2019 Bone Density: 01/2019 Recommended yearly ophthalmology/optometry visit for glaucoma screening and checkup Recommended yearly dental visit for hygiene and checkup  Vaccinations: Influenza vaccine: decline Pneumococcal vaccine: sent to pharmacy Tdap vaccine: 06/2012 Shingles vaccine: discussed    Advanced directives: Please bring a copy of your POA (Power of Attorney) and/or Living Will to your next appointment.   Conditions/risks identified: obesity  Next appointment:    Preventive Care 65 Years and Older, Female Preventive care refers to lifestyle choices and visits with your health care provider that can promote health and wellness. What does preventive care include?  A yearly physical exam. This is also called an annual well check.  Dental exams once or twice a year.  Routine eye exams. Ask your health care provider how often you should have your eyes checked.  Personal lifestyle choices, including:  Daily care of your teeth and gums.  Regular physical activity.  Eating a healthy diet.  Avoiding tobacco and drug use.  Limiting alcohol use.  Practicing safe sex.  Taking low-dose aspirin every day.  Taking vitamin and mineral supplements as recommended by your health care provider. What happens during an annual well check? The services and screenings done by your health care provider during your annual well check will depend on your age, overall health, lifestyle risk factors, and family history of disease. Counseling  Your health care provider may ask you questions about your:  Alcohol use.  Tobacco use.  Drug use.  Emotional  well-being.  Home and relationship well-being.  Sexual activity.  Eating habits.  History of falls.  Memory and ability to understand (cognition).  Work and work Astronomer.  Reproductive health. Screening  You may have the following tests or measurements:  Height, weight, and BMI.  Blood pressure.  Lipid and cholesterol levels. These may be checked every 5 years, or more frequently if you are over 92 years old.  Skin check.  Lung cancer screening. You may have this screening every year starting at age 70 if you have a 30-pack-year history of smoking and currently smoke or have quit within the past 15 years.  Fecal occult blood test (FOBT) of the stool. You may have this test every year starting at age 45.  Flexible sigmoidoscopy or colonoscopy. You may have a sigmoidoscopy every 5 years or a colonoscopy every 10 years starting at age 85.  Hepatitis C blood test.  Hepatitis B blood test.  Sexually transmitted disease (STD) testing.  Diabetes screening. This is done by checking your blood sugar (glucose) after you have not eaten for a while (fasting). You may have this done every 1-3 years.  Bone density scan. This is done to screen for osteoporosis. You may have this done starting at age 12.  Mammogram. This may be done every 1-2 years. Talk to your health care provider about how often you should have regular mammograms. Talk with your health care provider about your test results, treatment options, and if necessary, the need for more tests. Vaccines  Your health care provider may recommend certain vaccines, such as:  Influenza vaccine. This is recommended every year.  Tetanus, diphtheria, and acellular pertussis (Tdap, Td) vaccine. You may need a Td booster  every 10 years.  Zoster vaccine. You may need this after age 80.  Pneumococcal 13-valent conjugate (PCV13) vaccine. One dose is recommended after age 88.  Pneumococcal polysaccharide (PPSV23) vaccine. One  dose is recommended after age 62. Talk to your health care provider about which screenings and vaccines you need and how often you need them. This information is not intended to replace advice given to you by your health care provider. Make sure you discuss any questions you have with your health care provider. Document Released: 07/01/2015 Document Revised: 02/22/2016 Document Reviewed: 04/05/2015 Elsevier Interactive Patient Education  2017 [place] Prevention in the Home Falls can cause injuries. They can happen to people of all ages. There are many things you can do to make your home safe and to help prevent falls. What can I do on the outside of my home?  Regularly fix the edges of walkways and driveways and fix any cracks.  Remove anything that might make you trip as you walk through a door, such as a raised step or threshold.  Trim any bushes or trees on the path to your home.  Use bright outdoor lighting.  Clear any walking paths of anything that might make someone trip, such as rocks or tools.  Regularly check to see if handrails are loose or broken. Make sure that both sides of any steps have handrails.  Any raised decks and porches should have guardrails on the edges.  Have any leaves, snow, or ice cleared regularly.  Use sand or salt on walking paths during winter.  Clean up any spills in your garage right away. This includes oil or grease spills. What can I do in the bathroom?  Use night lights.  Install grab bars by the toilet and in the tub and shower. Do not use towel bars as grab bars.  Use non-skid mats or decals in the tub or shower.  If you need to sit down in the shower, use a plastic, non-slip stool.  Keep the floor dry. Clean up any water that spills on the floor as soon as it happens.  Remove soap buildup in the tub or shower regularly.  Attach bath mats securely with double-sided non-slip rug tape.  Do not have throw rugs and other  things on the floor that can make you trip. What can I do in the bedroom?  Use night lights.  Make sure that you have a light by your bed that is easy to reach.  Do not use any sheets or blankets that are too big for your bed. They should not hang down onto the floor.  Have a firm chair that has side arms. You can use this for support while you get dressed.  Do not have throw rugs and other things on the floor that can make you trip. What can I do in the kitchen?  Clean up any spills right away.  Avoid walking on wet floors.  Keep items that you use a lot in easy-to-reach places.  If you need to reach something above you, use a strong step stool that has a grab bar.  Keep electrical cords out of the way.  Do not use floor polish or wax that makes floors slippery. If you must use wax, use non-skid floor wax.  Do not have throw rugs and other things on the floor that can make you trip. What can I do with my stairs?  Do not leave any items on the stairs.  Make  sure that there are handrails on both sides of the stairs and use them. Fix handrails that are broken or loose. Make sure that handrails are as long as the stairways.  Check any carpeting to make sure that it is firmly attached to the stairs. Fix any carpet that is loose or worn.  Avoid having throw rugs at the top or bottom of the stairs. If you do have throw rugs, attach them to the floor with carpet tape.  Make sure that you have a light switch at the top of the stairs and the bottom of the stairs. If you do not have them, ask someone to add them for you. What else can I do to help prevent falls?  Wear shoes that:  Do not have high heels.  Have rubber bottoms.  Are comfortable and fit you well.  Are closed at the toe. Do not wear sandals.  If you use a stepladder:  Make sure that it is fully opened. Do not climb a closed stepladder.  Make sure that both sides of the stepladder are locked into place.  Ask  someone to hold it for you, if possible.  Clearly mark and make sure that you can see:  Any grab bars or handrails.  First and last steps.  Where the edge of each step is.  Use tools that help you move around (mobility aids) if they are needed. These include:  Canes.  Walkers.  Scooters.  Crutches.  Turn on the lights when you go into a dark area. Replace any light bulbs as soon as they burn out.  Set up your furniture so you have a clear path. Avoid moving your furniture around.  If any of your floors are uneven, fix them.  If there are any pets around you, be aware of where they are.  Review your medicines with your doctor. Some medicines can make you feel dizzy. This can increase your chance of falling. Ask your doctor what other things that you can do to help prevent falls. This information is not intended to replace advice given to you by your health care provider. Make sure you discuss any questions you have with your health care provider. Document Released: 03/31/2009 Document Revised: 11/10/2015 Document Reviewed: 07/09/2014 Elsevier Interactive Patient Education  2017 [employer].

## 2019-10-28 NOTE — Progress Notes (Signed)
This visit occurred during the SARS-CoV-2 public health emergency.  Safety protocols were in place, including screening questions prior to the visit, additional usage of staff PPE, and extensive cleaning of exam room while observing appropriate contact time as indicated for disinfecting solutions.  Subjective:   Kayla Calhoun is a 79 y.o. female who presents for Medicare Annual (Subsequent) preventive examination.  Review of Systems:  n/a Cardiac Risk Factors include: advanced age (>76men, >59 women);hypertension;obesity (BMI >30kg/m2)     Objective:     Vitals: BP 128/84   Pulse 80   Temp 97.8 F (36.6 C) (Oral)   Ht 5' 0.2" (1.529 m)   Wt 185 lb (83.9 kg)   BMI 35.89 kg/m   Body mass index is 35.89 kg/m.  Advanced Directives 10/28/2019 10/22/2018  Does Patient Have a Medical Advance Directive? Yes Yes  Type of Estate agent of [place];Living will Healthcare Power of [place];Living will  Copy of Healthcare Power of Attorney in Chart? No - copy requested No - copy requested    Tobacco Social History   Tobacco Use  Smoking Status Former Smoker  . Years: 10.00  . Types: Cigarettes  Smokeless Tobacco Never Used  Tobacco Comment   been quit 35 years     Counseling given: Not Answered Comment: been quit 35 years   Clinical Intake:  Pre-visit preparation completed: Yes  Pain : No/denies pain     Nutritional Status: BMI > 30  Obese Nutritional Risks: None Diabetes: No  How often do you need to have someone help you when you read instructions, pamphlets, or other written materials from your doctor or pharmacy?: 1 - Never What is the last grade level you completed in school?: business school  Interpreter Needed?: No  Information entered by :: NAllen LPN  Past Medical History:  Diagnosis Date  . Atrial fibrillation (HCC)   . Hypertension   . Vitamin D deficiency    Past Surgical History:  Procedure Laterality Date  . ABDOMINAL  HYSTERECTOMY    . KIDNEY SURGERY    . TONSILLECTOMY     Family History  Problem Relation Age of Onset  . Stroke Mother   . Prostate cancer Father   . Stroke Sister   . Hypertension Sister   . Hypertension Brother   . Stroke Brother    Social History   Socioeconomic History  . Marital status: Widowed    Spouse name: Not on file  . Number of children: 1  . Years of education: Not on file  . Highest education level: Not on file  Occupational History  . Occupation: retired  Tobacco Use  . Smoking status: Former Smoker    Years: 10.00    Types: Cigarettes  . Smokeless tobacco: Never Used  . Tobacco comment: been quit 35 years  Substance and Sexual Activity  . Alcohol use: Not Currently  . Drug use: Never  . Sexual activity: Not Currently  Other Topics Concern  . Not on file  Social History Narrative  . Not on file   Social Determinants of Health   Financial Resource Strain: Low Risk   . Difficulty of Paying Living Expenses: Not hard at all  Food Insecurity: No Food Insecurity  . Worried About Programme researcher, broadcasting/film/video in the Last Year: Never true  . Ran Out of Food in the Last Year: Never true  Transportation Needs: No Transportation Needs  . Lack of Transportation (Medical): No  . Lack of Transportation (Non-Medical): No  Physical Activity: Inactive  . Days of Exercise per Week: 0 days  . Minutes of Exercise per Session: 0 min  Stress: No Stress Concern Present  . Feeling of Stress : Not at all  Social Connections:   . Frequency of Communication with Friends and Family:   . Frequency of Social Gatherings with Friends and Family:   . Attends Religious Services:   . Active Member of Clubs or Organizations:   . Attends Archivist Meetings:   Marland Kitchen Marital Status:     Outpatient Encounter Medications as of 10/28/2019  Medication Sig  . amLODipine (NORVASC) 5 MG tablet TAKE 1 TABLET BY MOUTH EVERY DAY  . aspirin EC 81 MG tablet Take 81 mg by mouth daily.  .  Cholecalciferol (VITAMIN D3) 50 MCG (2000 UT) TABS Take by mouth daily.  Marland Kitchen levobunolol (BETAGAN) 0.5 % ophthalmic solution PLACE 1 DROP INTO BOTH EYES TWICE A DAY  . metoprolol succinate (TOPROL-XL) 50 MG 24 hr tablet Take 50 mg by mouth daily. Take with or immediately following a meal.  . pneumococcal 13-valent conjugate vaccine (PREVNAR 13) SUSP injection Inject 0.5 mLs into the muscle tomorrow at 10 am for 1 dose.  . TRAVATAN Z 0.004 % SOLN ophthalmic solution   . triamterene-hydrochlorothiazide (MAXZIDE-25) 37.5-25 MG tablet Take 1/2 tab po qd  . vitamin C (ASCORBIC ACID) 500 MG tablet Take 500 mg by mouth daily.   No facility-administered encounter medications on file as of 10/28/2019.    Activities of Daily Living In your present state of health, do you have any difficulty performing the following activities: 10/28/2019 10/28/2019  Hearing? Y N  Comment needs new hearing aides -  Vision? N N  Difficulty concentrating or making decisions? N N  Walking or climbing stairs? N N  Dressing or bathing? N N  Doing errands, shopping? N N  Preparing Food and eating ? N -  Using the Toilet? N -  In the past six months, have you accidently leaked urine? Y -  Comment with sneezing -  Do you have problems with loss of bowel control? N -  Managing your Medications? N -  Managing your Finances? N -  Housekeeping or managing your Housekeeping? N -  Some recent data might be hidden    Patient Care Team: Glendale Chard, MD as PCP - General (Internal Medicine)    Assessment:   This is a routine wellness examination for Kayla Calhoun.  Exercise Activities and Dietary recommendations Current Exercise Habits: The patient does not participate in regular exercise at present(work out in the yard)  Goals    . DIET - INCREASE WATER INTAKE    . Patient Stated     10/28/2019, wants to keep weight down       Fall Risk Fall Risk  10/28/2019 10/28/2019 04/28/2019 10/22/2018 03/27/2018  Falls in the past  year? 0 0 0 0 Yes  Comment - - - - -  Number falls in past yr: - 0 - - 1  Injury with Fall? - 0 - - No  Risk for fall due to : Medication side effect - - Medication side effect -  Follow up - - - Education provided;Falls prevention discussed -   Is the patient's home free of loose throw rugs in walkways, pet beds, electrical cords, etc?   yes      Grab bars in the bathroom? yes      Handrails on the stairs?   yes  Adequate lighting?   yes  Timed Get Up and Go performed: n/a  Depression Screen PHQ 2/9 Scores 10/28/2019 10/28/2019 10/22/2018 03/27/2018  PHQ - 2 Score 0 0 0 0  PHQ- 9 Score 0 - 0 -     Cognitive Function     6CIT Screen 10/28/2019 10/22/2018  What Year? 0 points 0 points  What month? 0 points 0 points  What time? 0 points 0 points  Count back from 20 0 points 0 points  Months in reverse 0 points 0 points  Repeat phrase 0 points 0 points  Total Score 0 0    Immunization History  Administered Date(s) Administered  . PFIZER SARS-COV-2 Vaccination 08/20/2019, 09/15/2019  . Pneumococcal Polysaccharide-23 03/27/2018    Qualifies for Shingles Vaccine? yes  Screening Tests Health Maintenance  Topic Date Due  . PNA vac Low Risk Adult (2 of 2 - PCV13) 03/28/2019  . INFLUENZA VACCINE  01/17/2020  . TETANUS/TDAP  06/30/2022  . DEXA SCAN  Completed  . COVID-19 Vaccine  Completed    Cancer Screenings: Lung: Low Dose CT Chest recommended if Age 12-80 years, 30 pack-year currently smoking OR have quit w/in 15years. Patient does not qualify. Breast:  Up to date on Mammogram? Yes   Up to date of Bone Density/Dexa? Yes Colorectal: not required  Additional Screenings: : Hepatitis C Screening: n/a     Plan:    Patient wants to keep weight down.   I have personally reviewed and noted the following in the patient's chart:   . Medical and social history . Use of alcohol, tobacco or illicit drugs  . Current medications and supplements . Functional ability and  status . Nutritional status . Physical activity . Advanced directives . List of other physicians . Hospitalizations, surgeries, and ER visits in previous 12 months . Vitals . Screenings to include cognitive, depression, and falls . Referrals and appointments  In addition, I have reviewed and discussed with patient certain preventive protocols, quality metrics, and best practice recommendations. A written personalized care plan for preventive services as well as general preventive health recommendations were provided to patient.     Barb Merino, LPN  [id number]

## 2019-10-28 NOTE — Patient Instructions (Signed)

## 2019-10-29 LAB — CBC
Hematocrit: 37.1 % (ref 34.0–46.6)
Hemoglobin: 12.4 g/dL (ref 11.1–15.9)
MCH: 28.6 pg (ref 26.6–33.0)
MCHC: 33.4 g/dL (ref 31.5–35.7)
MCV: 86 fL (ref 79–97)
Platelets: 286 10*3/uL (ref 150–450)
RBC: 4.34 x10E6/uL (ref 3.77–5.28)
RDW: 13.5 % (ref 11.7–15.4)
WBC: 4.9 10*3/uL (ref 3.4–10.8)

## 2019-10-29 LAB — CMP14+EGFR
ALT: 10 IU/L (ref 0–32)
AST: 22 IU/L (ref 0–40)
Albumin/Globulin Ratio: 1.5 (ref 1.2–2.2)
Albumin: 4.4 g/dL (ref 3.6–4.6)
Alkaline Phosphatase: 66 IU/L (ref 39–117)
BUN/Creatinine Ratio: 19 (ref 12–28)
BUN: 14 mg/dL (ref 8–27)
Bilirubin Total: 0.5 mg/dL (ref 0.0–1.2)
CO2: 22 mmol/L (ref 20–29)
Calcium: 9.9 mg/dL (ref 8.7–10.3)
Chloride: 103 mmol/L (ref 96–106)
Creatinine, Ser: 0.74 mg/dL (ref 0.57–1.00)
GFR calc Af Amer: 88 mL/min/{1.73_m2} (ref >59)
GFR calc non Af Amer: 76 mL/min/{1.73_m2} (ref >59)
Globulin, Total: 2.9 g/dL (ref 1.5–4.5)
Glucose: 90 mg/dL (ref 65–99)
Potassium: 4.7 mmol/L (ref 3.5–5.2)
Sodium: 141 mmol/L (ref 134–144)
Total Protein: 7.3 g/dL (ref 6.0–8.5)

## 2019-10-29 LAB — HEMOGLOBIN A1C
Est. average glucose Bld gHb Est-mCnc: 123 mg/dL
Hgb A1c MFr Bld: 5.9 % — ABNORMAL HIGH (ref 4.8–5.6)

## 2019-10-29 LAB — LIPID PANEL
Chol/HDL Ratio: 3 ratio (ref 0.0–4.4)
Cholesterol, Total: 173 mg/dL (ref 100–199)
HDL: 57 mg/dL (ref >39)
LDL Chol Calc (NIH): 104 mg/dL — ABNORMAL HIGH (ref 0–99)
Triglycerides: 60 mg/dL (ref 0–149)
VLDL Cholesterol Cal: 12 mg/dL (ref 5–40)

## 2019-11-01 NOTE — Progress Notes (Signed)
This visit occurred during the SARS-CoV-2 public health emergency.  Safety protocols were in place, including screening questions prior to the visit, additional usage of staff PPE, and extensive cleaning of exam room while observing appropriate contact time as indicated for disinfecting solutions.  Subjective:     Patient ID: Kayla Calhoun , female    DOB: May 06, 1941 , 79 y.o.   MRN: [id number]   Chief Complaint  Patient presents with  . Hypertension    HPI  She presents today for BP check. She reports compliance with meds. States she has new cardiologist, she was not pleased with previous one. She has no specific concerns or complaints at this time.   Hypertension This is a chronic problem. The current episode started more than 1 year ago. The problem is controlled. Pertinent negatives include no blurred vision, chest pain, palpitations or shortness of breath. Risk factors for coronary artery disease include obesity, sedentary lifestyle and post-menopausal state. Past treatments include calcium channel blockers, beta blockers and diuretics. The current treatment provides mild improvement. Hypertensive end-organ damage includes kidney disease.     Past Medical History:  Diagnosis Date  . Atrial fibrillation ([place])   . Hypertension   . Vitamin D deficiency      Family History  Problem Relation Age of Onset  . Stroke Mother   . Prostate cancer Father   . Stroke Sister   . Hypertension Sister   . Hypertension Brother   . Stroke Brother      Current Outpatient Medications:  .  amLODipine (NORVASC) 5 MG tablet, Take 1 tablet (5 mg total) by mouth daily., Disp: 90 tablet, Rfl: 2 .  aspirin EC 81 MG tablet, Take 81 mg by mouth daily., Disp: , Rfl:  .  Cholecalciferol (VITAMIN D3) 50 MCG (2000 UT) TABS, Take by mouth daily., Disp: , Rfl:  .  levobunolol (BETAGAN) 0.5 % ophthalmic solution, PLACE 1 DROP INTO BOTH EYES TWICE A DAY, Disp: , Rfl:  .  metoprolol succinate  (TOPROL-XL) 50 MG 24 hr tablet, Take 50 mg by mouth daily. Take with or immediately following a meal., Disp: , Rfl:  .  TRAVATAN Z 0.004 % SOLN ophthalmic solution, , Disp: , Rfl:  .  triamterene-hydrochlorothiazide (MAXZIDE-25) 37.5-25 MG tablet, Take 1/2 tab po qd, Disp: 90 tablet, Rfl: 1 .  vitamin C (ASCORBIC ACID) 500 MG tablet, Take 500 mg by mouth daily., Disp: , Rfl:    No Known Allergies   Review of Systems  Constitutional: Negative.   Eyes: Negative for blurred vision.  Respiratory: Negative.  Negative for shortness of breath.   Cardiovascular: Negative.  Negative for chest pain and palpitations.  Gastrointestinal: Negative.   Neurological: Negative.   Psychiatric/Behavioral: Negative.      Today's Vitals   10/28/19 0929  BP: 128/84  Pulse: 80  Temp: 97.8 F (36.6 C)  Weight: 185 lb 9.6 oz (84.2 kg)  Height: 5' 0.2" (1.529 m)   Body mass index is 36.01 kg/m.   Objective:  Physical Exam Vitals and nursing note reviewed.  Constitutional:      Appearance: Normal appearance.  HENT:     Head: Normocephalic and atraumatic.  Cardiovascular:     Rate and Rhythm: Normal rate. Rhythm irregular.     Heart sounds: Normal heart sounds.  Pulmonary:     Effort: Pulmonary effort is normal.     Breath sounds: Normal breath sounds.  Skin:    General: Skin is warm.  Neurological:  General: No focal deficit present.     Mental Status: She is alert.  Psychiatric:        Mood and Affect: Mood normal.        Behavior: Behavior normal.         Assessment And Plan:     1. Hypertensive heart and renal disease with renal failure, stage 1 through stage 4 or unspecified chronic kidney disease, without heart failure  Chronic, well controlled.  She will continue with current meds. She is encouraged to avoid adding salt to her foods. I will check labs as listed below.   - CMP14+EGFR - CBC - Lipid panel  2. Persistent atrial fibrillation (HCC)  Chronic, rate controlled.  She is encouraged to continue with current meds. Recent ofc visit with Cards reviewed in full detail. She has not yet made decision regarding starting new blood thinner. She will discuss further with Cards at her next visit.   3. Other abnormal glucose  HER A1C HAS BEEN ELEVATED IN THE PAST. I WILL CHECK AN A1C, BMET TODAY. SHE WAS ENCOURAGED TO AVOID SUGARY BEVERAGES AND PROCESSED FOODS INCLUDNG BREADS, RICE AND PASTA.  - Hemoglobin A1c  4. Class 2 severe obesity due to excess calories with serious comorbidity and body mass index (BMI) of 35.0 to 35.9 in adult [facility])  She is encouraged to strive for BMI less than 30 to decrease cardiac risk. Advised to work up to 30 minutes five days per week.    I personally spent 30 minutes face-to-face and non-face-to-face in the care of this patient, which includes all pre-, intra-, and post visit time on the date of service.  [name], MD    THE PATIENT IS ENCOURAGED TO PRACTICE SOCIAL DISTANCING DUE TO THE COVID-19 PANDEMIC.

## 2019-11-09 ENCOUNTER — Other Ambulatory Visit: Payer: Self-pay | Admitting: Internal Medicine

## 2019-11-17 ENCOUNTER — Ambulatory Visit: Payer: Medicare Other | Admitting: Cardiology

## 2020-01-21 ENCOUNTER — Ambulatory Visit (HOSPITAL_COMMUNITY): Payer: Medicare Other | Attending: Internal Medicine

## 2020-01-21 ENCOUNTER — Other Ambulatory Visit: Payer: Self-pay

## 2020-01-21 DIAGNOSIS — I340 Nonrheumatic mitral (valve) insufficiency: Secondary | ICD-10-CM | POA: Insufficient documentation

## 2020-01-22 LAB — ECHOCARDIOGRAM COMPLETE
Area-P 1/2: 4.83 cm2
MV M vel: 4.78 m/s
MV Peak grad: 91.4 mmHg
P 1/2 time: 379 msec
Radius: 0.5 cm
S' Lateral: 2.5 cm

## 2020-01-25 ENCOUNTER — Ambulatory Visit (INDEPENDENT_AMBULATORY_CARE_PROVIDER_SITE_OTHER): Payer: Medicare Other | Admitting: Cardiology

## 2020-01-25 ENCOUNTER — Encounter: Payer: Self-pay | Admitting: Cardiology

## 2020-01-25 ENCOUNTER — Other Ambulatory Visit: Payer: Self-pay

## 2020-01-25 VITALS — BP 120/80 | HR 92 | Ht 60.2 in | Wt 184.0 lb

## 2020-01-25 DIAGNOSIS — I10 Essential (primary) hypertension: Secondary | ICD-10-CM | POA: Diagnosis not present

## 2020-01-25 DIAGNOSIS — I4891 Unspecified atrial fibrillation: Secondary | ICD-10-CM

## 2020-01-25 DIAGNOSIS — Z79899 Other long term (current) drug therapy: Secondary | ICD-10-CM | POA: Diagnosis not present

## 2020-01-25 DIAGNOSIS — I340 Nonrheumatic mitral (valve) insufficiency: Secondary | ICD-10-CM

## 2020-01-25 MED ORDER — APIXABAN 5 MG PO TABS: 5.0000 mg | ORAL_TABLET | Freq: Two times a day (BID) | ORAL | 6 refills | Status: DC

## 2020-01-25 NOTE — Patient Instructions (Addendum)
Medication Instructions:  Please discontinue your Asprin and start Eliquis 5 mg (1) tablet twice a day. Continue all other medications as listed.  *If you need a refill on your cardiac medications before your next appointment, please call your pharmacy*  Lab Work: Please have blood work in 1 month.  (CBC) If you have labs (blood work) drawn today and your tests are completely normal, you will receive your results only by: Marland Kitchen MyChart Message (if you have MyChart) OR . A paper copy in the mail If you have any lab test that is abnormal or we need to change your treatment, we will call you to review the results.  Follow-Up: At [facility], you and your health needs are our priority.  As part of our continuing mission to provide you with exceptional heart care, we have created designated Provider Care Teams.  These Care Teams include your primary Cardiologist (physician) and Advanced Practice Providers (APPs -  Physician Assistants and Nurse Practitioners) who all work together to provide you with the care you need, when you need it.  We recommend signing up for the patient portal called "MyChart".  Sign up information is provided on this After Visit Summary.  MyChart is used to connect with patients for Virtual Visits (Telemedicine).  Patients are able to view lab/test results, encounter notes, upcoming appointments, etc.  Non-urgent messages can be sent to your provider as well.   To learn more about what you can do with MyChart, go to [email or web address].    Your next appointment:   4 week(s)  The format for your next appointment:   In Person  Provider:   You may see one of the following Advanced Practice Providers on your designated Care Team:    Norma Fredrickson, NP  Nada Boozer, NP  Georgie Chard, NP   Thank you for choosing [facility]!!
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
## 2020-03-03 ENCOUNTER — Encounter: Payer: Self-pay | Admitting: Cardiology

## 2020-03-03 ENCOUNTER — Ambulatory Visit (INDEPENDENT_AMBULATORY_CARE_PROVIDER_SITE_OTHER): Payer: Medicare Other | Admitting: Cardiology

## 2020-03-03 ENCOUNTER — Other Ambulatory Visit: Payer: Self-pay

## 2020-03-03 ENCOUNTER — Other Ambulatory Visit: Payer: Medicare Other | Admitting: *Deleted

## 2020-03-03 VITALS — BP 130/80 | HR 98 | Ht 60.02 in | Wt 185.8 lb

## 2020-03-03 DIAGNOSIS — I340 Nonrheumatic mitral (valve) insufficiency: Secondary | ICD-10-CM

## 2020-03-03 DIAGNOSIS — I10 Essential (primary) hypertension: Secondary | ICD-10-CM | POA: Diagnosis not present

## 2020-03-03 DIAGNOSIS — Z79899 Other long term (current) drug therapy: Secondary | ICD-10-CM

## 2020-03-03 DIAGNOSIS — I4891 Unspecified atrial fibrillation: Secondary | ICD-10-CM

## 2020-03-03 NOTE — Patient Instructions (Signed)
Medication Instructions:  Your physician recommends that you continue on your current medications as directed. Please refer to the Current Medication list given to you today.  *If you need a refill on your cardiac medications before your next appointment, please call your pharmacy*   Lab Work: TODAY:  CBC  If you have labs (blood work) drawn today and your tests are completely normal, you will receive your results only by: Marland Kitchen MyChart Message (if you have MyChart) OR . A paper copy in the mail If you have any lab test that is abnormal or we need to change your treatment, we will call you to review the results.   Testing/Procedures: None ordered   Follow-Up: At [facility], you and your health needs are our priority.  As part of our continuing mission to provide you with exceptional heart care, we have created designated Provider Care Teams.  These Care Teams include your primary Cardiologist (physician) and Advanced Practice Providers (APPs -  Physician Assistants and Nurse Practitioners) who all work together to provide you with the care you need, when you need it.  We recommend signing up for the patient portal called "MyChart".  Sign up information is provided on this After Visit Summary.  MyChart is used to connect with patients for Virtual Visits (Telemedicine).  Patients are able to view lab/test results, encounter notes, upcoming appointments, etc.  Non-urgent messages can be sent to your provider as well.   To learn more about what you can do with MyChart, go to [email or web address].    Your next appointment:   6 month(s)  The format for your next appointment:   In Person  Provider:   Donato Schultz, MD   Other Instructions

## 2020-03-04 LAB — CBC
Hematocrit: 37.2 % (ref 34.0–46.6)
Hemoglobin: 12.1 g/dL (ref 11.1–15.9)
MCH: 28.1 pg (ref 26.6–33.0)
MCHC: 32.5 g/dL (ref 31.5–35.7)
MCV: 87 fL (ref 79–97)
Platelets: 226 10*3/uL (ref 150–450)
RBC: 4.3 x10E6/uL (ref 3.77–5.28)
RDW: 13.5 % (ref 11.7–15.4)
WBC: 5.7 10*3/uL (ref 3.4–10.8)

## 2020-03-09 ENCOUNTER — Ambulatory Visit: Payer: Medicare Other | Admitting: Cardiology

## 2020-04-16 ENCOUNTER — Ambulatory Visit: Payer: Medicare Other | Attending: Internal Medicine

## 2020-04-16 DIAGNOSIS — Z23 Encounter for immunization: Secondary | ICD-10-CM

## 2020-04-16 NOTE — Progress Notes (Signed)
   Covid-19 Vaccination Clinic  Name:  Kayla Calhoun    MRN: [id number] DOB: [birth year]  04/16/2020  [name] was observed post Covid-19 immunization for 15 minutes without incident. She was provided with Vaccine Information Sheet and instruction to access the V-Safe system.   [name] was instructed to call 911 with any severe reactions post vaccine: Marland Kitchen Difficulty breathing  . Swelling of face and throat  . A fast heartbeat  . A bad rash all over body  . Dizziness and weakness

## 2020-04-18 ENCOUNTER — Ambulatory Visit: Payer: Medicare Other | Admitting: Cardiology

## 2020-05-02 ENCOUNTER — Encounter: Payer: Self-pay | Admitting: Internal Medicine

## 2020-05-02 ENCOUNTER — Ambulatory Visit (INDEPENDENT_AMBULATORY_CARE_PROVIDER_SITE_OTHER): Payer: Medicare Other | Admitting: Internal Medicine

## 2020-05-02 ENCOUNTER — Other Ambulatory Visit: Payer: Self-pay

## 2020-05-02 VITALS — BP 130/80 | HR 90 | Temp 97.9°F | Ht 60.2 in | Wt 185.6 lb

## 2020-05-02 DIAGNOSIS — Z6836 Body mass index (BMI) 36.0-36.9, adult: Secondary | ICD-10-CM | POA: Diagnosis not present

## 2020-05-02 DIAGNOSIS — R7309 Other abnormal glucose: Secondary | ICD-10-CM | POA: Diagnosis not present

## 2020-05-02 DIAGNOSIS — Z2821 Immunization not carried out because of patient refusal: Secondary | ICD-10-CM

## 2020-05-02 DIAGNOSIS — I4819 Other persistent atrial fibrillation: Secondary | ICD-10-CM

## 2020-05-02 DIAGNOSIS — I1310 Hypertensive heart and chronic kidney disease without heart failure, with stage 1 through stage 4 chronic kidney disease, or unspecified chronic kidney disease: Secondary | ICD-10-CM

## 2020-05-02 DIAGNOSIS — E661 Drug-induced obesity: Secondary | ICD-10-CM

## 2020-05-02 DIAGNOSIS — N183 Chronic kidney disease, stage 3 unspecified: Secondary | ICD-10-CM | POA: Diagnosis not present

## 2020-05-02 NOTE — Progress Notes (Signed)
I,Tianna Badgett,acting as a Education administrator for [name], MD.,have documented all relevant documentation on the behalf of [name], MD,as directed by  [name], MD while in the presence of [name], MD.  This visit occurred during the SARS-CoV-2 public health emergency.  Safety protocols were in place, including screening questions prior to the visit, additional usage of staff PPE, and extensive cleaning of exam room while observing appropriate contact time as indicated for disinfecting solutions.  Subjective:     Patient ID: Kayla Calhoun , female    DOB: [birth year] , 79 y.o.   MRN: [id number]   Chief Complaint  Patient presents with  . Hypertension    HPI  She presents today for BP check. She reports compliance with meds. She has no specific concerns or complaints at this time. She is preparing to bake a lot of cakes for the holiday season.  She has over 30 orders at this time.   Hypertension This is a chronic problem. The current episode started more than 1 year ago. The problem is controlled. Pertinent negatives include no blurred vision, chest pain, palpitations or shortness of breath. Risk factors for coronary artery disease include obesity, sedentary lifestyle and post-menopausal state. Past treatments include calcium channel blockers, beta blockers and diuretics. The current treatment provides mild improvement. Hypertensive end-organ damage includes kidney disease.     Past Medical History:  Diagnosis Date  . Atrial fibrillation ([place])   . Hypertension   . Vitamin D deficiency      Family History  Problem Relation Age of Onset  . Stroke Mother   . Prostate cancer Father   . Stroke Sister   . Hypertension Sister   . Hypertension Brother   . Stroke Brother      Current Outpatient Medications:  .  amLODipine (NORVASC) 5 MG tablet, Take 1 tablet (5 mg total) by mouth daily., Disp: 90 tablet, Rfl: 2 .  apixaban (ELIQUIS) 5 MG TABS tablet, Take 1  tablet (5 mg total) by mouth 2 (two) times daily., Disp: 60 tablet, Rfl: 6 .  Cholecalciferol (VITAMIN D3) 50 MCG (2000 UT) TABS, Take by mouth daily., Disp: , Rfl:  .  levobunolol (BETAGAN) 0.5 % ophthalmic solution, PLACE 1 DROP INTO BOTH EYES TWICE A DAY, Disp: , Rfl:  .  metoprolol succinate (TOPROL-XL) 50 MG 24 hr tablet, Take 50 mg by mouth daily. Take with or immediately following a meal., Disp: , Rfl:  .  TRAVATAN Z 0.004 % SOLN ophthalmic solution, , Disp: , Rfl:  .  triamterene-hydrochlorothiazide (MAXZIDE-25) 37.5-25 MG tablet, TAKE 1 TABLET BY MOUTH EVERY DAY (Patient taking differently: TAKE 1/2 TABLET BY MOUTH EVERY DAY), Disp: 90 tablet, Rfl: 1 .  vitamin C (ASCORBIC ACID) 500 MG tablet, Take 500 mg by mouth daily., Disp: , Rfl:    No Known Allergies   Review of Systems  Constitutional: Negative.   Eyes: Negative for blurred vision.  Respiratory: Negative.  Negative for shortness of breath.   Cardiovascular: Negative.  Negative for chest pain and palpitations.  Gastrointestinal: Negative.   Neurological: Negative.      Today's Vitals   05/02/20 1515  BP: 130/80  Pulse: 90  Temp: 97.9 F (36.6 C)  TempSrc: Oral  Weight: 185 lb 9.6 oz (84.2 kg)  Height: 5' 0.2" (1.529 m)   Body mass index is 36.01 kg/m.  Wt Readings from Last 3 Encounters:  05/02/20 185 lb 9.6 oz (84.2 kg)  03/03/20 185  lb 12.8 oz (84.3 kg)  01/25/20 184 lb (83.5 kg)     Objective:  Physical Exam Vitals and nursing note reviewed.  Constitutional:      Appearance: Normal appearance.  HENT:     Head: Normocephalic and atraumatic.  Cardiovascular:     Rate and Rhythm: Normal rate. Rhythm irregular.     Heart sounds: Murmur heard.   Pulmonary:     Effort: Pulmonary effort is normal.     Breath sounds: Normal breath sounds.  Skin:    General: Skin is warm.  Neurological:     General: No focal deficit present.     Mental Status: She is alert.  Psychiatric:        Mood and Affect: Mood  normal.        Behavior: Behavior normal.         Assessment And Plan:     1. Hypertensive heart and renal disease with renal failure, stage 1 through stage 4 or unspecified chronic kidney disease, without heart failure Comments: Chronic, controlled.  She will continue with current meds. She is encouraged to avoid adding salt to her foods.  - CMP14+EGFR  2. Persistent atrial fibrillation ([place]) Comments: Chronic, she is rate controlled. She is properly anticoagulated.   3. Stage 3 chronic kidney disease, unspecified whether stage 3a or 3b CKD (HCC) Comments: Chronic. Encouraged to stay well hydrated and keep BP controlled to avoid progression of CKD.   4. Other abnormal glucose Comments: Her a1c has been elevated in the past, I will recheck this today. Encouraged to avoid sugary beverages and increase daily activity. - Hemoglobin A1c  5. Class 2 severe obesity due to excess calories with serious comorbidity and body mass index (BMI) of 36.0 to 36.9 in adult [facility]) Comments: She is encouraged to strive for BMI less than 30 to decrease cardiac risk. Advised to aim for at least 150 minutes of exercise per week.   6. Influenza vaccination declined    Patient was given opportunity to ask questions. Patient verbalized understanding of the plan and was able to repeat key elements of the plan. All questions were answered to their satisfaction.  [name], MD   I, [name], MD, have reviewed all documentation for this visit. The documentation on 05/07/20 for the exam, diagnosis, procedures, and orders are all accurate and complete.  THE PATIENT IS ENCOURAGED TO PRACTICE SOCIAL DISTANCING DUE TO THE COVID-19 PANDEMIC.

## 2020-05-02 NOTE — Patient Instructions (Signed)

## 2020-05-03 ENCOUNTER — Ambulatory Visit: Payer: Medicare Other | Admitting: Internal Medicine

## 2020-05-03 LAB — HEMOGLOBIN A1C
Est. average glucose Bld gHb Est-mCnc: 120 mg/dL
Hgb A1c MFr Bld: 5.8 % — ABNORMAL HIGH (ref 4.8–5.6)

## 2020-05-03 LAB — CMP14+EGFR
ALT: 9 IU/L (ref 0–32)
AST: 18 IU/L (ref 0–40)
Albumin/Globulin Ratio: 1.6 (ref 1.2–2.2)
Albumin: 4.5 g/dL (ref 3.6–4.6)
Alkaline Phosphatase: 71 IU/L (ref 44–121)
BUN/Creatinine Ratio: 16 (ref 12–28)
BUN: 15 mg/dL (ref 8–27)
Bilirubin Total: 0.4 mg/dL (ref 0.0–1.2)
CO2: 25 mmol/L (ref 20–29)
Calcium: 9.8 mg/dL (ref 8.7–10.3)
Chloride: 101 mmol/L (ref 96–106)
Creatinine, Ser: 0.93 mg/dL (ref 0.57–1.00)
GFR calc Af Amer: 67 mL/min/{1.73_m2} (ref >59)
GFR calc non Af Amer: 58 mL/min/{1.73_m2} — ABNORMAL LOW (ref >59)
Globulin, Total: 2.8 g/dL (ref 1.5–4.5)
Glucose: 88 mg/dL (ref 65–99)
Potassium: 4.4 mmol/L (ref 3.5–5.2)
Sodium: 142 mmol/L (ref 134–144)
Total Protein: 7.3 g/dL (ref 6.0–8.5)

## 2020-05-17 ENCOUNTER — Other Ambulatory Visit: Payer: Self-pay | Admitting: Internal Medicine

## 2020-05-20 DIAGNOSIS — H524 Presbyopia: Secondary | ICD-10-CM | POA: Diagnosis not present

## 2020-05-20 DIAGNOSIS — H401123 Primary open-angle glaucoma, left eye, severe stage: Secondary | ICD-10-CM | POA: Diagnosis not present

## 2020-05-20 DIAGNOSIS — H401112 Primary open-angle glaucoma, right eye, moderate stage: Secondary | ICD-10-CM | POA: Diagnosis not present

## 2020-05-20 DIAGNOSIS — H2513 Age-related nuclear cataract, bilateral: Secondary | ICD-10-CM | POA: Diagnosis not present

## 2020-06-24 ENCOUNTER — Other Ambulatory Visit: Payer: Self-pay | Admitting: Cardiology

## 2020-06-24 ENCOUNTER — Other Ambulatory Visit: Payer: Self-pay | Admitting: Internal Medicine

## 2020-06-27 ENCOUNTER — Other Ambulatory Visit: Payer: Self-pay

## 2020-06-27 MED ORDER — METOPROLOL SUCCINATE ER 50 MG PO TB24
50.0000 mg | ORAL_TABLET | Freq: Every day | ORAL | 2 refills | Status: DC
Start: 2020-06-27 — End: 2021-04-06

## 2020-08-20 ENCOUNTER — Other Ambulatory Visit: Payer: Self-pay | Admitting: Cardiology

## 2020-08-20 DIAGNOSIS — I4819 Other persistent atrial fibrillation: Secondary | ICD-10-CM

## 2020-08-22 NOTE — Telephone Encounter (Signed)
Prescription refill request for Eliquis received. Indication: A Fib Last office visit: 03/03/20 Scr:0.93 Age: 80 Weight: 84 kg

## 2020-08-30 ENCOUNTER — Ambulatory Visit (INDEPENDENT_AMBULATORY_CARE_PROVIDER_SITE_OTHER): Payer: Medicare Other | Admitting: Cardiology

## 2020-08-30 ENCOUNTER — Other Ambulatory Visit: Payer: Self-pay

## 2020-08-30 ENCOUNTER — Encounter: Payer: Self-pay | Admitting: Cardiology

## 2020-08-30 VITALS — BP 130/80 | HR 101 | Ht 60.0 in | Wt 186.0 lb

## 2020-08-30 DIAGNOSIS — I340 Nonrheumatic mitral (valve) insufficiency: Secondary | ICD-10-CM | POA: Diagnosis not present

## 2020-08-30 DIAGNOSIS — I4891 Unspecified atrial fibrillation: Secondary | ICD-10-CM | POA: Diagnosis not present

## 2020-08-30 NOTE — Patient Instructions (Signed)
Medication Instructions:  The current medical regimen is effective;  continue present plan and medications.  *If you need a refill on your cardiac medications before your next appointment, please call your pharmacy*  You have been referred electrophysiology to discuss Watchman device.  Follow-Up: At [facility], you and your health needs are our priority.  As part of our continuing mission to provide you with exceptional heart care, we have created designated Provider Care Teams.  These Care Teams include your primary Cardiologist (physician) and Advanced Practice Providers (APPs -  Physician Assistants and Nurse Practitioners) who all work together to provide you with the care you need, when you need it.  We recommend signing up for the patient portal called "MyChart".  Sign up information is provided on this After Visit Summary.  MyChart is used to connect with patients for Virtual Visits (Telemedicine).  Patients are able to view lab/test results, encounter notes, upcoming appointments, etc.  Non-urgent messages can be sent to your provider as well.   To learn more about what you can do with MyChart, go to [email or web address].    Your next appointment:   6 month(s)  The format for your next appointment:   In Person  Provider:   Donato Schultz, MD  Thank you for choosing [facility]!!

## 2020-08-30 NOTE — Progress Notes (Addendum)
Cardiology Office Note:    Date:  08/30/2020   ID:  Kayla, Calhoun [birth year], MRN [id number]  PCP:  Dorothyann Peng, MD   [facility]  Cardiologist:  No primary care provider on file.  Advanced Practice Provider:  No care team member to display Electrophysiologist:  None       Referring MD: Dorothyann Peng, MD     History of Present Illness:    Kayla Calhoun is a 80 y.o. female here for the follow-up of atrial fibrillation.  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  She tells the story that she was on the telephone when she had the worst headache of her life.  At last office visit we had lengthy discussion about pros and cons of anticoagulation and since it was several years since her [facility] we decided to start her on Eliquis.  She has been doing well over the last 6 months.  We had discussion with her daughter as well.  Overall she is having no fevers chills nausea vomiting syncope bleeding  Past Medical History:  Diagnosis Date   Atrial fibrillation (HCC)    Hypertension    Vitamin D deficiency     Past Surgical History:  Procedure Laterality Date   ABDOMINAL HYSTERECTOMY     KIDNEY SURGERY     TONSILLECTOMY      Current Medications: Current Meds  Medication Sig   amLODipine (NORVASC) 5 MG tablet TAKE 1 TABLET BY MOUTH EVERY DAY   Cholecalciferol (VITAMIN D3) 50 MCG (2000 UT) TABS Take by mouth daily.   ELIQUIS 5 MG TABS tablet TAKE 1 TABLET BY MOUTH TWICE A DAY   levobunolol (BETAGAN) 0.5 % ophthalmic solution PLACE 1 DROP INTO BOTH EYES TWICE A DAY   metoprolol succinate (TOPROL-XL) 50 MG 24 hr tablet Take 1 tablet (50 mg total) by mouth daily. Take with or immediately following a meal.   TRAVATAN Z 0.004 % SOLN ophthalmic solution    triamterene-hydrochlorothiazide (MAXZIDE-25) 37.5-25 MG tablet TAKE 1/2 TABLET BY MOUTH EVERY DAY    vitamin C (ASCORBIC ACID) 500 MG tablet Take 500 mg by mouth daily.     Allergies:   Patient has no known allergies.   Social History   Socioeconomic History   Marital status: Widowed    Spouse name: Not on file   Number of children: 1   Years of education: Not on file   Highest education level: Not on file  Occupational History   Occupation: retired  Tobacco Use   Smoking status: Former Smoker    Years: 10.00    Types: Cigarettes   Smokeless tobacco: Never Used   Tobacco comment: been quit 35 years  Vaping Use   Vaping Use: Never used  Substance and Sexual Activity   Alcohol use: Not Currently   Drug use: Never   Sexual activity: Not Currently  Other Topics Concern   Not on file  Social History Narrative   Not on file   Social Determinants of Health   Financial Resource Strain: Low Risk    Difficulty of Paying Living Expenses: Not hard at all  Food Insecurity: No Food Insecurity   Worried About Programme researcher, broadcasting/film/video in the Last Year: Never true   Ran Out of Food in the Last Year: Never true  Transportation Needs: No Transportation Needs   Lack of Transportation (Medical): No   Lack of Transportation (Non-Medical):  No  Physical Activity: Inactive   Days of Exercise per Week: 0 days   Minutes of Exercise per Session: 0 min  Stress: No Stress Concern Present   Feeling of Stress : Not at all  Social Connections: Not on file     Family History: The patient's family history includes Hypertension in her brother and sister; Prostate cancer in her father; Stroke in her brother, mother, and sister.  ROS:   Please see the history of present illness.     All other systems reviewed and are negative.  EKGs/Labs/Other Studies Reviewed:    The following studies were reviewed today:   Echocardiogram 01/21/2020:   1. Left ventricular ejection fraction, by estimation, is 50%. The left  ventricle has low normal function. The left ventricle has no regional wall  motion  abnormalities. Left ventricular diastolic parameters are  indeterminate.   2. Right ventricular systolic function is normal. The right ventricular  size is normal. There is normal pulmonary artery systolic pressure. The  estimated right ventricular systolic pressure is 33.2 mmHg.   3. Left atrial size was moderately dilated.   4. Right atrial size was mildly dilated.   5. The mitral valve is degenerative. Moderate mitral valve regurgitation.  No evidence of mitral stenosis.   6. Tricuspid valve regurgitation is mild to moderate.   7. The aortic valve is tricuspid. Aortic valve regurgitation is mild. No  aortic stenosis is present.   8. The inferior vena cava is dilated in size with >50% respiratory  variability, suggesting right atrial pressure of 8 mmHg.     Lexiscan myoview stress test 09/24/2016: 1. The resting electrocardiogram demonstrated atrial fibrillation, normal resting conduction and normal rest repolarization. Stress EKG is non-diagnostic for ischemia as it a pharmacologic stress using Lexiscan. Stress symptoms included nausea. 2. SPECT images demonstrate homogeneous tracer distribution throughout the myocardium. Gated SPECT imaging reveals normal myocardial thickening and wall motion. The left ventricular ejection fraction was normal visually but calculated at (43%). Additionally, the right ventricle is normal. EF calculation may be an error due to difficulty in EKG gating    EKG:  AFIB  Recent Labs: 03/03/2020: Hemoglobin 12.1; Platelets 226 05/02/2020: ALT 9; BUN 15; Creatinine, Ser 0.93; Potassium 4.4; Sodium 142  Recent Lipid Panel    Component Value Date/Time   CHOL 173 10/28/2019 1039   TRIG 60 10/28/2019 1039   HDL 57 10/28/2019 1039   CHOLHDL 3.0 10/28/2019 1039   LDLCALC 104 (H) 10/28/2019 1039     Risk Assessment/Calculations:      Physical Exam:    VS:  BP 130/80 (BP Location: Left Arm, Patient Position: Sitting, Cuff Size: Normal)   Pulse (!) 101    Ht 5' (1.524 m)   Wt 186 lb (84.4 kg)   BMI 36.33 kg/m     Wt Readings from Last 3 Encounters:  08/30/20 186 lb (84.4 kg)  05/02/20 185 lb 9.6 oz (84.2 kg)  03/03/20 185 lb 12.8 oz (84.3 kg)     GEN:  Well nourished, well developed in no acute distress HEENT: Normal NECK: No JVD; No carotid bruits LYMPHATICS: No lymphadenopathy CARDIAC: irreg, no murmurs, rubs, gallops RESPIRATORY:  Clear to auscultation without rales, wheezing or rhonchi  ABDOMEN: Soft, non-tender, non-distended MUSCULOSKELETAL:  No edema; No deformity  SKIN: Warm and dry NEUROLOGIC:  Alert and oriented x 3 PSYCHIATRIC:  Normal affect   ASSESSMENT:    1. Atrial fibrillation with controlled ventricular response (HCC)   2. Mitral valve  insufficiency, unspecified etiology    PLAN:    In order of problems listed above:  Persistent atrial fibrillation -We started her on Eliquis 5 mg twice a day last August after it has been several years since her subarachnoid hemorrhage/aneurysm. -I would like to refer her to Dr. Lalla Brothers to discuss potential watchman device.  She is a very active 80 year old.  This may be a potential option for her to reduce her overall long-term bleeding risks.  Mitral regurgitation -Moderate mitral regurgitation in the past.  Echo as above.  Essential hypertension -Under good control.    I have seen MAHALIE KANNER is a 80 y.o. female in the office today. The patient is felt to be a poor candidate for long-term anticoagulation because of bleeding.  Their CHADS-2-Vasc Score and unadjusted Ischemic Stroke Rate (% per year) is equal to 4.8 % stroke rate/year from a score of 4, necessitating a strategy of stroke prevention with either long-term oral anticoagulation or left atrial appendage occlusion therapy. We have discussed their bleeding risk in the context of their comorbid medical problems, as well as the rationale for referral for evaluation of Watchman left atrial appendage occlusion  therapy. While the patient is at high long-term bleeding risk, they may be appropriate for short-term anticoagulation. Based on this individual patient's stroke and bleeding risk, a shared decision has been made to refer the patient for consideration of Watchman left atrial appendage closure utilizing the [employer] shared decision tool.   Donato Schultz, MD    Medication Adjustments/Labs and Tests Ordered: Current medicines are reviewed at length with the patient today.  Concerns regarding medicines are outlined above.  Orders Placed This Encounter  Procedures   Ambulatory referral to Cardiac Electrophysiology   No orders of the defined types were placed in this encounter.   Patient Instructions  Medication Instructions:  The current medical regimen is effective;  continue present plan and medications.  *If you need a refill on your cardiac medications before your next appointment, please call your pharmacy*  You have been referred electrophysiology to discuss Watchman device.  Follow-Up: At [facility], you and your health needs are our priority.  As part of our continuing mission to provide you with exceptional heart care, we have created designated Provider Care Teams.  These Care Teams include your primary Cardiologist (physician) and Advanced Practice Providers (APPs -  Physician Assistants and Nurse Practitioners) who all work together to provide you with the care you need, when you need it.  We recommend signing up for the patient portal called "MyChart".  Sign up information is provided on this After Visit Summary.  MyChart is used to connect with patients for Virtual Visits (Telemedicine).  Patients are able to view lab/test results, encounter notes, upcoming appointments, etc.  Non-urgent messages can be sent to your provider as well.   To learn more about what you can do with MyChart, go to [email or web address].    Your next appointment:    6 month(s)  The format for your next appointment:   In Person  Provider:   Donato Schultz, MD  Thank you for choosing [facility]!!        Signed, Donato Schultz, MD  08/30/2020 2:53 PM    [facility]

## 2020-09-21 ENCOUNTER — Telehealth: Payer: Self-pay | Admitting: Internal Medicine

## 2020-09-21 DIAGNOSIS — H04123 Dry eye syndrome of bilateral lacrimal glands: Secondary | ICD-10-CM | POA: Diagnosis not present

## 2020-09-21 DIAGNOSIS — H401123 Primary open-angle glaucoma, left eye, severe stage: Secondary | ICD-10-CM | POA: Diagnosis not present

## 2020-09-21 DIAGNOSIS — H401112 Primary open-angle glaucoma, right eye, moderate stage: Secondary | ICD-10-CM | POA: Diagnosis not present

## 2020-09-21 NOTE — Telephone Encounter (Signed)
Left message asking patient to call [phone].  Please r/s 5/18 appointment nickeah has meeting that morning

## 2020-09-27 ENCOUNTER — Other Ambulatory Visit: Payer: Self-pay

## 2020-09-27 ENCOUNTER — Encounter: Payer: Self-pay | Admitting: Cardiology

## 2020-09-27 ENCOUNTER — Ambulatory Visit (INDEPENDENT_AMBULATORY_CARE_PROVIDER_SITE_OTHER): Payer: Medicare Other | Admitting: Cardiology

## 2020-09-27 VITALS — BP 116/74 | HR 95 | Ht 60.0 in | Wt 187.0 lb

## 2020-09-27 DIAGNOSIS — I340 Nonrheumatic mitral (valve) insufficiency: Secondary | ICD-10-CM

## 2020-09-27 DIAGNOSIS — I10 Essential (primary) hypertension: Secondary | ICD-10-CM

## 2020-09-27 DIAGNOSIS — I4819 Other persistent atrial fibrillation: Secondary | ICD-10-CM | POA: Diagnosis not present

## 2020-09-27 DIAGNOSIS — I609 Nontraumatic subarachnoid hemorrhage, unspecified: Secondary | ICD-10-CM

## 2020-09-27 MED ORDER — METOPROLOL TARTRATE 100 MG PO TABS: ORAL_TABLET | ORAL | 0 refills | Status: DC

## 2020-09-27 NOTE — Patient Instructions (Addendum)
Medication Instructions:  Your physician recommends that you continue on your current medications as directed. Please refer to the Current Medication list given to you today. *If you need a refill on your cardiac medications before your next appointment, please call your pharmacy*  Lab Work: You will get lab work the same day as your ECHO. Please schedule for BMP  If you have labs (blood work) drawn today and your tests are completely normal, you will receive your results only by: Marland Kitchen MyChart Message (if you have MyChart) OR . A paper copy in the mail If you have any lab test that is abnormal or we need to change your treatment, we will call you to review the results.  Testing/Procedures: Your physician has requested that you have an echocardiogram. Echocardiography is a painless test that uses sound waves to create images of your heart. It provides your doctor with information about the size and shape of your heart and how well your heart's chambers and valves are working. This procedure takes approximately one hour. There are no restrictions for this procedure.  Please schedule ECHO  Your physician has requested that you have cardiac CT. Cardiac computed tomography (CT) is a painless test that uses an x-ray machine to take clear, detailed pictures of your heart.  You will get a call with date/time for this test when it is approved by your insurance.  Follow-Up: At [facility], you and your health needs are our priority.  As part of our continuing mission to provide you with exceptional heart care, we have created designated Provider Care Teams.  These Care Teams include your primary Cardiologist (physician) and Advanced Practice Providers (APPs -  Physician Assistants and Nurse Practitioners) who all work together to provide you with the care you need, when you need it.  Your next appointment:    Your next appointment will be at the afib clinic after your current tests have been  evaluated.  This is the directions to their office:  AFIB CLINIC INFORMATION: Your appointment is scheduled on: __________ at _________________. Please arrive 15 minutes early for check-in. The AFib Clinic is located in the Heart and [facility] Specialty Clinics at [facility]. Parking instructions/directions: Government social research officer C ([address]). When you pull in to Entrance C, there is an underground parking garage to your right. The code to enter the garage is ______________. Take the elevators to the first floor. Follow the signs to the Heart and Vascular Specialty Clinics. You will see registration at the end of the hallway.  Phone number: [phone]  Nurse navigator:  Robin Searing at [phone]    Left Atrial Appendage Closure Device Implantation  Left atrial appendage (LAA) closure device implantation is a procedure to put a small device in the LAA of the heart. The LAA is a small sac in the wall of the heart's left upper chamber. Blood clots can form in the LAA in people with atrial fibrillation (AFib). The device closes the LAA to help prevent a blood clot and stroke.

## 2020-09-27 NOTE — Progress Notes (Signed)
Electrophysiology Office Note:    Date:  09/27/2020   ID:  Kayla, Calhoun [birth year], MRN [id number]  PCP:  Dorothyann Peng, MD  [facility] Cardiologist:  No primary care provider on file.  [facility] Electrophysiologist:  Lanier Prude, MD   Referring MD: Jake Bathe, MD   Chief Complaint: Atrial fibrillation  History of Present Illness:    Kayla Calhoun is a 80 y.o. female who presents for an evaluation of atrial fibrillation at the request of Dr. Anne Fu. 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  The patient last saw Dr. Anne Fu on August 30, 2020.  The patient has been on anticoagulation now for over 6 months and is doing well.  Past Medical History:  Diagnosis Date  . Atrial fibrillation (HCC)   . Hypertension   . Vitamin D deficiency     Past Surgical History:  Procedure Laterality Date  . ABDOMINAL HYSTERECTOMY    . KIDNEY SURGERY    . TONSILLECTOMY      Current Medications: Current Meds  Medication Sig  . amLODipine (NORVASC) 5 MG tablet TAKE 1 TABLET BY MOUTH EVERY DAY  . Cholecalciferol (VITAMIN D3) 50 MCG (2000 UT) TABS Take by mouth daily.  Marland Kitchen ELIQUIS 5 MG TABS tablet TAKE 1 TABLET BY MOUTH TWICE A DAY  . levobunolol (BETAGAN) 0.5 % ophthalmic solution PLACE 1 DROP INTO BOTH EYES TWICE A DAY  . metoprolol succinate (TOPROL-XL) 50 MG 24 hr tablet Take 1 tablet (50 mg total) by mouth daily. Take with or immediately following a meal.  . TRAVATAN Z 0.004 % SOLN ophthalmic solution   . triamterene-hydrochlorothiazide (MAXZIDE-25) 37.5-25 MG tablet TAKE 1/2 TABLET BY MOUTH EVERY DAY  . vitamin C (ASCORBIC ACID) 500 MG tablet Take 500 mg by mouth daily.     Allergies:   Patient has no known allergies.   Social History   Socioeconomic History  . Marital status: Widowed    Spouse name: Not on file  . Number of  children: 1  . Years of education: Not on file  . Highest education level: Not on file  Occupational History  . Occupation: retired  Tobacco Use  . Smoking status: Former Smoker    Years: 10.00    Types: Cigarettes  . Smokeless tobacco: Never Used  . Tobacco comment: been quit 35 years  Vaping Use  . Vaping Use: Never used  Substance and Sexual Activity  . Alcohol use: Not Currently  . Drug use: Never  . Sexual activity: Not Currently  Other Topics Concern  . Not on file  Social History Narrative  . Not on file   Social Determinants of Health   Financial Resource Strain: Low Risk   . Difficulty of Paying Living Expenses: Not hard at all  Food Insecurity: No Food Insecurity  . Worried About Programme researcher, broadcasting/film/video in the Last Year: Never true  . Ran Out of Food in the Last Year: Never true  Transportation Needs: No Transportation Needs  . Lack of Transportation (Medical): No  . Lack of Transportation (Non-Medical): No  Physical Activity: Inactive  . Days of Exercise per Week: 0 days  . Minutes of Exercise per Session: 0 min  Stress: No Stress Concern Present  . Feeling of Stress : Not at all  Social Connections: Not on file     Family History: The patient's family  history includes Hypertension in her brother and sister; Prostate cancer in her father; Stroke in her brother, mother, and sister.  ROS:   Please see the history of present illness.    All other systems reviewed and are negative.  EKGs/Labs/Other Studies Reviewed:    The following studies were reviewed today:  September 21, 2019 echo personally reviewed Left ventricular function normal, 50% Right ventricular function normal Dilated left and right atria Moderate mitral regurgitation Mild to moderate tricuspid valve regurgitation Mild aortic regurgitation  EKG:  The ekg ordered today demonstrates atrial fibrillation  Recent Labs: 03/03/2020: Hemoglobin 12.1; Platelets 226 05/02/2020: ALT 9; BUN 15;  Creatinine, Ser 0.93; Potassium 4.4; Sodium 142  Recent Lipid Panel    Component Value Date/Time   CHOL 173 10/28/2019 1039   TRIG 60 10/28/2019 1039   HDL 57 10/28/2019 1039   CHOLHDL 3.0 10/28/2019 1039   LDLCALC 104 (H) 10/28/2019 1039    Physical Exam:    VS:  BP 116/74   Pulse 95   Ht 5' (1.524 m)   Wt 187 lb (84.8 kg)   SpO2 92%   BMI 36.52 kg/m     Wt Readings from Last 3 Encounters:  09/27/20 187 lb (84.8 kg)  08/30/20 186 lb (84.4 kg)  05/02/20 185 lb 9.6 oz (84.2 kg)     GEN:  Well nourished, well developed in no acute distress HEENT: Normal NECK: No JVD; No carotid bruits LYMPHATICS: No lymphadenopathy CARDIAC: irregularly irregular, no murmurs, rubs, gallops RESPIRATORY:  Clear to auscultation without rales, wheezing or rhonchi  ABDOMEN: Soft, non-tender, non-distended MUSCULOSKELETAL:  No edema; No deformity  SKIN: Warm and dry NEUROLOGIC:  Alert and oriented x 3 PSYCHIATRIC:  Normal affect   ASSESSMENT:    1. Persistent atrial fibrillation (HCC)   2. Subarachnoid bleed (HCC)   3. Primary hypertension   4. Moderate mitral insufficiency    PLAN:    In order of problems listed above:  1. Persistent atrial fibrillation Patient with history of subarachnoid hemorrhage thought to be secondary to an intracranial aneurysm.  This occurred in 2009.  Since that time she has been doing well.  She has been tolerating Eliquis 5 mg twice daily for the last 6 months without bleeding issues.  She is interested in pursuing a stroke prophylaxis strategy that avoids long-term exposure to anticoagulation.  We discussed options including continuing anticoagulation or using a left atrial appendage occlusion to achieve this goal.  I discussed the watchman procedure in detail during today's visit including the risks, expected recovery time and need for short-term anticoagulation.  Her daughter is with her today in clinic.  They would like to proceed with scheduling.  For now,  continue Eliquis.  I have seen [name] in the office today who is being considered for a Watchman left atrial appendage closure device. I believe they will benefit from this procedure given their history of atrial fibrillation, CHA2DS2-VASc score of 6 and unadjusted ischemic stroke rate of 9.7% per year. Unfortunately, the patient is not felt to be a long term anticoagulation candidate secondary to a history of subarachnoid hemorrhage. The patient's chart has been reviewed and I feel that they would be a candidate for short term oral anticoagulation after Watchman implant.   It is my belief that after undergoing a LAA closure procedure, [name] will not need long term anticoagulation which eliminates anticoagulation side effects and major bleeding risk.   Procedural risks for the Watchman implant have  been reviewed with the patient including a 0.5% risk of stroke, <1% risk of perforation and <1% risk of device embolization.   The published clinical data on the safety and effectiveness of WATCHMAN include but are not limited to the following: - Holmes DR, Everlene Farrier, Sick P et al. for the PROTECT AF Investigators. Percutaneous closure of the left atrial appendage versus warfarin therapy for prevention of stroke in patients with atrial fibrillation: a randomised non-inferiority trial. Lancet 2009; 374: 534-42. Everlene Farrier, Doshi SK, Isa Rankin D et al. on behalf of the PROTECT AF Investigators. Percutaneous Left Atrial Appendage Closure for Stroke Prophylaxis in Patients With Atrial Fibrillation 2.3-Year Follow-up of the PROTECT AF (Watchman Left Atrial Appendage System for Embolic Protection in Patients With Atrial Fibrillation) Trial. Circulation 2013; 127:720-729. - Alli O, Doshi S,  Kar S, Reddy VY, Sievert H et al. Quality of Life Assessment in the Randomized PROTECT AF (Percutaneous Closure of the Left Atrial Appendage Versus Warfarin Therapy for Prevention of Stroke in  Patients With Atrial Fibrillation) Trial of Patients at Risk for Stroke With Nonvalvular Atrial Fibrillation. J Am Coll Cardiol 2013; 61:1790-8. Aline August DR, Mia [place], Price M, Whisenant B, Sievert H, Doshi S, Huber K, Reddy V. Prospective randomized evaluation of the Watchman left atrial appendage Device in patients with atrial fibrillation versus long-term warfarin therapy; the PREVAIL trial. Journal of the [employer] of Cardiology, Vol. 4, No. 1, 2014, 1-11. - Kar S, Doshi SK, Sadhu A, Horton R, Osorio J et al. Primary outcome evaluation of a next-generation left atrial appendage closure device: results from the PINNACLE FLX trial. Circulation 2021;143(18)1754-1762.    After today's visit with the patient which was dedicated solely for shared decision making visit regarding LAA closure device, the patient decided to proceed with the LAA appendage closure procedure scheduled to be done in the near future at [facility]. Prior to the procedure, I would like to obtain a gated CT scan of the chest with contrast timed for PV/LA visualization.   Additionally, the patient will need an echocardiogram.   Total time spent with patient today 65 minutes. This includes reviewing records, evaluating the patient and coordinating care.  Medication Adjustments/Labs and Tests Ordered: Current medicines are reviewed at length with the patient today.  Concerns regarding medicines are outlined above.  No orders of the defined types were placed in this encounter.  No orders of the defined types were placed in this encounter.    Signed, Steffanie Dunn, MD, [facility] 09/27/2020 9:21 AM    Electrophysiology [facility]

## 2020-10-04 ENCOUNTER — Other Ambulatory Visit: Payer: Medicare Other | Admitting: *Deleted

## 2020-10-04 ENCOUNTER — Other Ambulatory Visit: Payer: Self-pay

## 2020-10-04 DIAGNOSIS — I609 Nontraumatic subarachnoid hemorrhage, unspecified: Secondary | ICD-10-CM

## 2020-10-04 DIAGNOSIS — I4819 Other persistent atrial fibrillation: Secondary | ICD-10-CM

## 2020-10-04 LAB — BASIC METABOLIC PANEL
BUN/Creatinine Ratio: 15 (ref 12–28)
BUN: 14 mg/dL (ref 8–27)
CO2: 26 mmol/L (ref 20–29)
Calcium: 9.9 mg/dL (ref 8.7–10.3)
Chloride: 101 mmol/L (ref 96–106)
Creatinine, Ser: 0.91 mg/dL (ref 0.57–1.00)
Glucose: 93 mg/dL (ref 65–99)
Potassium: 4.5 mmol/L (ref 3.5–5.2)
Sodium: 141 mmol/L (ref 134–144)
eGFR: 63 mL/min/{1.73_m2} (ref >59)

## 2020-10-05 ENCOUNTER — Ambulatory Visit (INDEPENDENT_AMBULATORY_CARE_PROVIDER_SITE_OTHER): Payer: Medicare Other | Admitting: Nurse Practitioner

## 2020-10-05 VITALS — BP 130/72 | HR 70 | Temp 98.7°F | Ht 60.0 in | Wt 190.6 lb

## 2020-10-05 DIAGNOSIS — H9193 Unspecified hearing loss, bilateral: Secondary | ICD-10-CM | POA: Diagnosis not present

## 2020-10-05 NOTE — Progress Notes (Signed)
I,Tianna Badgett,acting as a Neurosurgeon for [employer], NP.,have documented all relevant documentation on the behalf of [employer], NP,as directed by  Charlesetta Ivory, NP while in the presence of Charlesetta Ivory, NP.  This visit occurred during the SARS-CoV-2 public health emergency.  Safety protocols were in place, including screening questions prior to the visit, additional usage of staff PPE, and extensive cleaning of exam room while observing appropriate contact time as indicated for disinfecting solutions.  Subjective:     Patient ID: Kayla Calhoun , female    DOB: Jul 04, 1940 , 80 y.o.   MRN: [id number]   Chief Complaint  Patient presents with  . Hearing Problem    HPI  Patient is here with complaints of hearing loss. She was previously seen at [facility] and would like a referral to go there again. She has loss of hearing in both ears. No ringing in the ears. Ears do not hurt, she just has a lot of trouble hearing.  She has had hearing aids but they broke. So she wants to get new sets. No other concerns.     Past Medical History:  Diagnosis Date  . Atrial fibrillation (HCC)   . Hypertension   . Vitamin D deficiency      Family History  Problem Relation Age of Onset  . Stroke Mother   . Prostate cancer Father   . Stroke Sister   . Hypertension Sister   . Hypertension Brother   . Stroke Brother      Current Outpatient Medications:  .  amLODipine (NORVASC) 5 MG tablet, TAKE 1 TABLET BY MOUTH EVERY DAY, Disp: 90 tablet, Rfl: 2 .  Cholecalciferol (VITAMIN D3) 50 MCG (2000 UT) TABS, Take by mouth daily., Disp: , Rfl:  .  ELIQUIS 5 MG TABS tablet, TAKE 1 TABLET BY MOUTH TWICE A DAY, Disp: 90 tablet, Rfl: 1 .  levobunolol (BETAGAN) 0.5 % ophthalmic solution, PLACE 1 DROP INTO BOTH EYES TWICE A DAY, Disp: , Rfl:  .  metoprolol succinate (TOPROL-XL) 50 MG 24 hr tablet, Take 1 tablet (50 mg total) by mouth daily. Take with or immediately following a meal.,  Disp: 90 tablet, Rfl: 2 .  metoprolol tartrate (LOPRESSOR) 100 MG tablet, Take one tablet by mouth 2 hours prior to cardiac CT, Disp: 1 tablet, Rfl: 0 .  TRAVATAN Z 0.004 % SOLN ophthalmic solution, , Disp: , Rfl:  .  triamterene-hydrochlorothiazide (MAXZIDE-25) 37.5-25 MG tablet, TAKE 1/2 TABLET BY MOUTH EVERY DAY, Disp: 90 tablet, Rfl: 1 .  vitamin C (ASCORBIC ACID) 500 MG tablet, Take 500 mg by mouth daily., Disp: , Rfl:    No Known Allergies   Review of Systems  Constitutional: Negative.  Negative for chills and fever.  HENT: Positive for hearing loss. Negative for congestion.        Bilateral   Respiratory: Negative.  Negative for chest tightness and shortness of breath.   Cardiovascular: Negative.  Negative for chest pain and palpitations.  Gastrointestinal: Negative.   Neurological: Negative.      Today's Vitals   10/05/20 1052  BP: 130/72  Pulse: 70  Temp: 98.7 F (37.1 C)  TempSrc: Oral  Weight: 190 lb 9.6 oz (86.5 kg)  Height: 5' (1.524 m)   Body mass index is 37.22 kg/m.   Objective:  Physical Exam Constitutional:      Appearance: Normal appearance.  HENT:     Head: Normocephalic and atraumatic.     Right Ear: Tympanic membrane, ear canal  and external ear normal. Decreased hearing noted. No swelling or tenderness. There is no impacted cerumen.     Left Ear: Tympanic membrane, ear canal and external ear normal. Decreased hearing noted. No swelling or tenderness. There is no impacted cerumen.     Ears:     Comments: Whisper test not normal. Patient unable to hear in both ears.  Cardiovascular:     Rate and Rhythm: Normal rate and regular rhythm.     Pulses: Normal pulses.     Heart sounds: Normal heart sounds.  Pulmonary:     Effort: Pulmonary effort is normal. No respiratory distress.     Breath sounds: Normal breath sounds. No wheezing.  Neurological:     Mental Status: She is alert.         Assessment And Plan:     1. Bilateral hearing loss,  unspecified hearing loss type -Patient has had progressive hearing loss in both ears for a very long time.  -She has seen a audiologist couple years back and worn hearing aids but they broke.  -She is here to get another referral to go back to the audiologist.  -In office whisper test was not normal. Patient was not able to hear in both ears.  - Ambulatory referral to Audiology   Follow up: as needed   Patient was given opportunity to ask questions. Patient verbalized understanding of the plan and was able to repeat key elements of the plan. All questions were answered to their satisfaction.  Charlesetta Ivory, NP   I, Charlesetta Ivory, NP  have reviewed all documentation for this visit. The documentation on 10/05/20 for the exam, diagnosis, procedures, and orders are all accurate and complete.   IF YOU HAVE BEEN REFERRED TO A SPECIALIST, IT MAY TAKE 1-2 WEEKS TO SCHEDULE/PROCESS THE REFERRAL. IF YOU HAVE NOT HEARD FROM US/SPECIALIST IN TWO WEEKS, PLEASE GIVE [place] A CALL AT [phone].   THE PATIENT IS ENCOURAGED TO PRACTICE SOCIAL DISTANCING DUE TO THE COVID-19 PANDEMIC.

## 2020-10-05 NOTE — Patient Instructions (Signed)
Hearing Loss Hearing loss is a partial or total loss of the ability to hear. This can be temporary or permanent, and it can happen in one or both ears. Medical care is necessary to treat hearing loss properly and to prevent the condition from getting worse. Your hearing may partially or completely come back, depending on what caused your hearing loss and how severe it is. In some cases, hearing loss is permanent. What are the causes? Common causes of hearing loss include:  Too much wax in the ear canal.  Infection of the ear canal or middle ear.  Fluid in the middle ear.  Injury to the ear or surrounding area.  An object stuck in the ear.  A history of prolonged exposure to loud sounds, such as music. Less common causes of hearing loss include:  Tumors in the ear.  Viral or bacterial infections, such as meningitis.  A hole in the eardrum (perforated eardrum).  Problems with the hearing nerve that sends signals between the brain and the ear.  Certain medicines. What are the signs or symptoms? Symptoms of this condition may include:  Difficulty telling the difference between sounds.  Difficulty following a conversation when there is background noise.  Lack of response to sounds in your environment. This may be most noticeable when you do not respond to startling sounds.  Needing to turn up the volume on the television, radio, or other devices.  Ringing in the ears.  Dizziness. How is this diagnosed? This condition is diagnosed based on:  A physical exam.  A hearing test (audiometry). The audiometry test will be performed by a hearing specialist (audiologist). You may also be referred to an ear, nose, and throat (ENT) specialist (otolaryngologist).   How is this treated? Treatment for hearing loss may include:  Ear wax removal.  Medicines to treat or prevent infection (antibiotics).  Medicines to reduce inflammation (corticosteroids).  Hearing aids for hearing  loss related to nerve damage. Follow these instructions at home:  If you were prescribed an antibiotic medicine, take it as told by your health care provider. Do not stop taking the antibiotic even if you start to feel better.  Take over-the-counter and prescription medicines only as told by your health care provider.  Avoid loud noises.  Return to your normal activities as told by your health care provider. Ask your health care provider what activities are safe for you.  Keep all follow-up visits as told by your health care provider. This is important. Contact a health care provider if:  You feel dizzy.  You develop new symptoms.  You vomit or feel nauseous.  You have a fever. Get help right away if:  You develop sudden changes in your vision.  You have severe ear pain.  You have new or increased weakness.  You have a severe headache. Summary  Hearing loss is a decreased ability to hear sounds around you. It can be temporary or permanent.  Treatment will depend on the cause of your hearing loss. It may include ear wax removal, medicines, or a hearing aid.  Your hearing may partially or completely come back, depending on what caused your hearing loss and how severe it is.  Keep all follow-up visits as told by your health care provider. This is important. This information is not intended to replace advice given to you by your health care provider. Make sure you discuss any questions you have with your health care provider. Document Revised: 03/04/2018 Document Reviewed: 03/04/2018 Elsevier   Patient Education  2021 Elsevier Inc.  

## 2020-10-12 ENCOUNTER — Telehealth (HOSPITAL_COMMUNITY): Payer: Self-pay | Admitting: Emergency Medicine

## 2020-10-12 ENCOUNTER — Ambulatory Visit: Payer: Self-pay | Admitting: Nurse Practitioner

## 2020-10-12 ENCOUNTER — Ambulatory Visit: Payer: Medicare Other | Admitting: Nurse Practitioner

## 2020-10-12 NOTE — Telephone Encounter (Signed)
Attempted to call patient regarding upcoming cardiac CT appointment. °Left message on voicemail with name and callback number °Matilynn Dacey RN Navigator Cardiac Imaging °[facility] °[phone] Office °[phone] Cell ° °

## 2020-10-12 NOTE — Telephone Encounter (Signed)
Returning phone call regarding upcoming cardiac imaging study; pt verbalizes understanding of appt date/time, parking situation and where to check in, pre-test NPO status and medications ordered, and verified current allergies; name and call back number provided for further questions should they arise Rockwell Alexandria RN Navigator Cardiac Imaging [facility] [phone] office [phone] cell  Hold maxide, take 100mg  metoprolol 2 hr prior to scan 

## 2020-10-13 ENCOUNTER — Ambulatory Visit (HOSPITAL_COMMUNITY)
Admission: RE | Admit: 2020-10-13 | Discharge: 2020-10-13 | Disposition: A | Discharge status: Home / Self Care | Payer: Medicare Other | Source: Ambulatory Visit | Attending: Cardiology | Admitting: Cardiology

## 2020-10-13 ENCOUNTER — Other Ambulatory Visit: Payer: Self-pay

## 2020-10-13 DIAGNOSIS — I609 Nontraumatic subarachnoid hemorrhage, unspecified: Secondary | ICD-10-CM

## 2020-10-13 DIAGNOSIS — I4819 Other persistent atrial fibrillation: Secondary | ICD-10-CM | POA: Diagnosis not present

## 2020-10-13 MED ORDER — IOHEXOL 350 MG/ML SOLN
80.0000 mL | Freq: Once | INTRAVENOUS | Status: AC | PRN
  Administered 2020-10-13: 80 mL via INTRAVENOUS

## 2020-10-24 ENCOUNTER — Telehealth (HOSPITAL_COMMUNITY): Payer: Self-pay | Admitting: *Deleted

## 2020-10-24 ENCOUNTER — Telehealth (HOSPITAL_COMMUNITY): Payer: Self-pay | Admitting: Emergency Medicine

## 2020-10-24 NOTE — Telephone Encounter (Signed)
Pt calling and left VM reporting she "broke out after her test"   Attempted to return phone call and left message to have her call back.  Rockwell Alexandria RN Navigator Cardiac Imaging [facility] [phone] Office  [phone] Cell

## 2020-10-24 NOTE — Telephone Encounter (Signed)
Received a phone call from patient who states that her skin is "shedding." Pt got a cardiac CT scan on October 13, 2020 and stated that she had some itching that evening.  Pt took benadryl per the instructions and that eased her itching. Pt later realized the benadryl was expired.  Pt states that her skins issues began 4 days ago.  Pt educated about allergic reactions and informed that her reaction was unlikely the result from CT scan since her symptoms developed days after her scan. Pt did not appear to be in any respiratory distress over the phone and she denied any pain. Pt was encouraged to seek medical attention to have someone assess her skin.  Pt verbalized understanding.  Dorette Grate RN navigator - Cardiac imaging [phone]

## 2020-10-25 DIAGNOSIS — H401123 Primary open-angle glaucoma, left eye, severe stage: Secondary | ICD-10-CM | POA: Diagnosis not present

## 2020-10-26 ENCOUNTER — Ambulatory Visit (HOSPITAL_COMMUNITY): Payer: Medicare Other | Attending: Cardiology

## 2020-10-26 ENCOUNTER — Other Ambulatory Visit: Payer: Medicare Other

## 2020-10-26 ENCOUNTER — Other Ambulatory Visit: Payer: Self-pay

## 2020-10-26 DIAGNOSIS — I609 Nontraumatic subarachnoid hemorrhage, unspecified: Secondary | ICD-10-CM | POA: Insufficient documentation

## 2020-10-26 DIAGNOSIS — I4819 Other persistent atrial fibrillation: Secondary | ICD-10-CM | POA: Diagnosis not present

## 2020-10-26 LAB — ECHOCARDIOGRAM COMPLETE
Area-P 1/2: 4.93 cm2
MV M vel: 4.61 m/s
MV Peak grad: 85 mmHg
P 1/2 time: 480 msec
Radius: 0.7 cm
S' Lateral: 2.4 cm

## 2020-11-01 ENCOUNTER — Telehealth: Payer: Self-pay | Admitting: Nurse Practitioner

## 2020-11-01 NOTE — Telephone Encounter (Signed)
  Call placed this morning to introduce myself and to discuss patients interest in the next available Watchman implant date. Kayla Calhoun was appreciative and stated that she would like for me to call back on Thursday so that her daughter can be present for the conversation. She also stated that she had a reaction to the contrast from the CT and will be seeing her PCP on Wednesday to discuss an allergy test and have the skin evaluated. I will follow up per patient request on Thursday and to see if procedure should be delay due to skin reaction.    Robin Searing, RN Watchman Coordinator Office [phone] Cell [phone]

## 2020-11-02 ENCOUNTER — Encounter: Payer: Self-pay | Admitting: Internal Medicine

## 2020-11-02 ENCOUNTER — Ambulatory Visit: Payer: Medicare Other

## 2020-11-02 ENCOUNTER — Other Ambulatory Visit: Payer: Self-pay

## 2020-11-02 ENCOUNTER — Ambulatory Visit (INDEPENDENT_AMBULATORY_CARE_PROVIDER_SITE_OTHER): Payer: Medicare Other | Admitting: Internal Medicine

## 2020-11-02 VITALS — BP 122/70 | HR 77 | Temp 97.9°F | Ht 60.0 in | Wt 196.4 lb

## 2020-11-02 DIAGNOSIS — R7309 Other abnormal glucose: Secondary | ICD-10-CM | POA: Diagnosis not present

## 2020-11-02 DIAGNOSIS — I1310 Hypertensive heart and chronic kidney disease without heart failure, with stage 1 through stage 4 chronic kidney disease, or unspecified chronic kidney disease: Secondary | ICD-10-CM

## 2020-11-02 DIAGNOSIS — Z6838 Body mass index (BMI) 38.0-38.9, adult: Secondary | ICD-10-CM

## 2020-11-02 DIAGNOSIS — N182 Chronic kidney disease, stage 2 (mild): Secondary | ICD-10-CM

## 2020-11-02 DIAGNOSIS — I4819 Other persistent atrial fibrillation: Secondary | ICD-10-CM | POA: Diagnosis not present

## 2020-11-02 DIAGNOSIS — T7840XA Allergy, unspecified, initial encounter: Secondary | ICD-10-CM

## 2020-11-02 DIAGNOSIS — R234 Changes in skin texture: Secondary | ICD-10-CM

## 2020-11-02 DIAGNOSIS — E66812 Obesity, class 2: Secondary | ICD-10-CM

## 2020-11-02 MED ORDER — HYDROXYZINE HCL 10 MG PO TABS: ORAL_TABLET | ORAL | 0 refills | Status: DC

## 2020-11-02 NOTE — Progress Notes (Signed)
I,Katawbba Wiggins,acting as a Education administrator for [name], MD.,have documented all relevant documentation on the behalf of [name], MD,as directed by  [name], MD while in the presence of [name], MD.  This visit occurred during the SARS-CoV-2 public health emergency.  Safety protocols were in place, including screening questions prior to the visit, additional usage of staff PPE, and extensive cleaning of exam room while observing appropriate contact time as indicated for disinfecting solutions.  Subjective:     Patient ID: Kayla Calhoun , female    DOB: Oct 26, 1940 , 80 y.o.   MRN: [id number]   Chief Complaint  Patient presents with  . Hypertension    HPI  She presents today for BP check.  The patient states that she needs an allergy test done, her heart doctor is requesting it because she broke out in hives at end of April. States she had cardiac CT on 4/28 - developed red, itchy rash the next day. She took some Benadryl with some relief of her sx. The following day, she noticed her skin was peeling. Initially on her extremities, then on her torso. She has never had similar sx in the past. Denies allergy to shellfish. Denies having any Sob/wheezing associated with the rash. she also did not have any tongue/lip swelling. Not sure what else could have contributed to her sx. Reports cardiologist states she needs to have some tests done. Admits she did not call office when she was experiencing these sx.   Hypertension This is a chronic problem. The current episode started more than 1 year ago. The problem is controlled. Pertinent negatives include no blurred vision, chest pain, palpitations or shortness of breath. Risk factors for coronary artery disease include obesity, sedentary lifestyle and post-menopausal state. Past treatments include calcium channel blockers, beta blockers and diuretics. The current treatment provides mild improvement. Hypertensive end-organ damage  includes kidney disease.     Past Medical History:  Diagnosis Date  . Atrial fibrillation ([place])   . Hypertension   . Vitamin D deficiency      Family History  Problem Relation Age of Onset  . Stroke Mother   . Prostate cancer Father   . Stroke Sister   . Hypertension Sister   . Hypertension Brother   . Stroke Brother      Current Outpatient Medications:  .  amLODipine (NORVASC) 5 MG tablet, TAKE 1 TABLET BY MOUTH EVERY DAY, Disp: 90 tablet, Rfl: 2 .  Cholecalciferol (VITAMIN D3) 50 MCG (2000 UT) TABS, Take by mouth daily., Disp: , Rfl:  .  ELIQUIS 5 MG TABS tablet, TAKE 1 TABLET BY MOUTH TWICE A DAY, Disp: 90 tablet, Rfl: 1 .  hydrOXYzine (ATARAX/VISTARIL) 10 MG tablet, One tab twice daily as needed for itching, Disp: 30 tablet, Rfl: 0 .  levobunolol (BETAGAN) 0.5 % ophthalmic solution, PLACE 1 DROP INTO BOTH EYES TWICE A DAY, Disp: , Rfl:  .  metoprolol succinate (TOPROL-XL) 50 MG 24 hr tablet, Take 1 tablet (50 mg total) by mouth daily. Take with or immediately following a meal., Disp: 90 tablet, Rfl: 2 .  TRAVATAN Z 0.004 % SOLN ophthalmic solution, , Disp: , Rfl:  .  triamterene-hydrochlorothiazide (MAXZIDE-25) 37.5-25 MG tablet, TAKE 1/2 TABLET BY MOUTH EVERY DAY, Disp: 90 tablet, Rfl: 1 .  vitamin C (ASCORBIC ACID) 500 MG tablet, Take 500 mg by mouth daily., Disp: , Rfl:    No Known Allergies   Review of Systems  Constitutional: Negative.  Eyes: Negative for blurred vision.  Respiratory: Negative.  Negative for shortness of breath.   Cardiovascular: Negative.  Negative for chest pain and palpitations.  Gastrointestinal: Negative.   Skin: Positive for rash.  Psychiatric/Behavioral: Negative.   All other systems reviewed and are negative.    Today's Vitals   11/02/20 0856  BP: 122/70  Pulse: 77  Temp: 97.9 F (36.6 C)  TempSrc: Oral  Weight: 196 lb 6.4 oz (89.1 kg)  Height: 5' (1.524 m)  PainSc: 0-No pain   Body mass index is 38.36 kg/m.  Wt Readings  from Last 3 Encounters:  11/02/20 196 lb 6.4 oz (89.1 kg)  10/05/20 190 lb 9.6 oz (86.5 kg)  09/27/20 187 lb (84.8 kg)   BP Readings from Last 3 Encounters:  11/02/20 122/70  10/13/20 131/87  10/05/20 130/72   Objective:  Physical Exam Vitals and nursing note reviewed.  Constitutional:      Appearance: Normal appearance. She is obese.  HENT:     Head: Normocephalic and atraumatic.     Nose:     Comments: Masked     Mouth/Throat:     Comments: Masked  Cardiovascular:     Rate and Rhythm: Normal rate. Rhythm irregular.     Heart sounds: Normal heart sounds.  Pulmonary:     Effort: Pulmonary effort is normal.     Breath sounds: Normal breath sounds.  Skin:    General: Skin is warm.     Comments: Peeling skin on b/l ankles, hands  Neurological:     General: No focal deficit present.     Mental Status: She is alert.  Psychiatric:        Mood and Affect: Mood normal.        Behavior: Behavior normal.         Assessment And Plan:     1. Hypertensive heart and renal disease with renal failure, stage 1 through stage 4 or unspecified chronic kidney disease, without heart failure Comments: Chronic, well controlled. She is encouraged to follow low sodium diet. I will not make any medication changes today.  - CBC with Diff - CMP14+EGFR - Lipid panel  2. Persistent atrial fibrillation (HCC) Comments: Chronic, rate controlled. She is properly anticoagulated. She is planning to have Watchman procedure in the near future.   3. Chronic renal disease, stage II Comments: Chronic, I will check renal function today. Encouraged to stay well hydrated and to keep BP controlled.   4. Allergic reaction, initial encounter Comments: Appears to be due to contrast. I will refer to Allergy for further evaluation. I will also check food allergy panel.  - IgG Allergens (92) Foods - Ambulatory referral to Allergy  5. Skin desquamation Comments: Sx are resolving. Pt encouraged to notify  myself or allergist ASAP should this occur again.   6. Other abnormal glucose Comments: Her a1c has been elevated in the past. I will recheck this today. Encouraged to limit her intake of sweetened beverages.  - Hemoglobin A1c  7. Class 2 severe obesity due to excess calories with serious comorbidity and body mass index (BMI) of 38.0 to 38.9 in adult [facility]) Comments:  She is encouraged to strive for BMI less than 30 to decrease cardiac risk. Advised to aim for at least 150 minutes of exercise per week.    Patient was given opportunity to ask questions. Patient verbalized understanding of the plan and was able to repeat key elements of the plan. All questions were answered to their  satisfaction.   I, [name], MD, have reviewed all documentation for this visit. The documentation on 11/02/20 for the exam, diagnosis, procedures, and orders are all accurate and complete.   IF YOU HAVE BEEN REFERRED TO A SPECIALIST, IT MAY TAKE 1-2 WEEKS TO SCHEDULE/PROCESS THE REFERRAL. IF YOU HAVE NOT HEARD FROM US/SPECIALIST IN TWO WEEKS, PLEASE GIVE [place] A CALL AT [phone].   THE PATIENT IS ENCOURAGED TO PRACTICE SOCIAL DISTANCING DUE TO THE COVID-19 PANDEMIC.

## 2020-11-02 NOTE — Patient Instructions (Signed)

## 2020-11-03 ENCOUNTER — Telehealth: Payer: Self-pay | Admitting: Nurse Practitioner

## 2020-11-03 ENCOUNTER — Telehealth: Payer: Self-pay | Admitting: Internal Medicine

## 2020-11-03 ENCOUNTER — Telehealth: Payer: Self-pay | Admitting: Allergy & Immunology

## 2020-11-03 LAB — CMP14+EGFR
ALT: 12 IU/L (ref 0–32)
AST: 20 IU/L (ref 0–40)
Albumin/Globulin Ratio: 1.9 (ref 1.2–2.2)
Albumin: 4.2 g/dL (ref 3.6–4.6)
Alkaline Phosphatase: 53 IU/L (ref 44–121)
BUN/Creatinine Ratio: 11 — ABNORMAL LOW (ref 12–28)
BUN: 10 mg/dL (ref 8–27)
Bilirubin Total: 0.6 mg/dL (ref 0.0–1.2)
CO2: 25 mmol/L (ref 20–29)
Calcium: 9.9 mg/dL (ref 8.7–10.3)
Chloride: 103 mmol/L (ref 96–106)
Creatinine, Ser: 0.87 mg/dL (ref 0.57–1.00)
Globulin, Total: 2.2 g/dL (ref 1.5–4.5)
Glucose: 100 mg/dL — ABNORMAL HIGH (ref 65–99)
Potassium: 4.5 mmol/L (ref 3.5–5.2)
Sodium: 141 mmol/L (ref 134–144)
Total Protein: 6.4 g/dL (ref 6.0–8.5)
eGFR: 66 mL/min/{1.73_m2} (ref >59)

## 2020-11-03 LAB — LIPID PANEL
Chol/HDL Ratio: 3 ratio (ref 0.0–4.4)
Cholesterol, Total: 167 mg/dL (ref 100–199)
HDL: 56 mg/dL (ref >39)
LDL Chol Calc (NIH): 99 mg/dL (ref 0–99)
Triglycerides: 60 mg/dL (ref 0–149)
VLDL Cholesterol Cal: 12 mg/dL (ref 5–40)

## 2020-11-03 NOTE — Telephone Encounter (Signed)
Patient called and needs a new patient appointment as soon as possible because she is having surgery and she had a reaction to the dye. Needs some help and getting her in earlier than aug. Please review her records.

## 2020-11-03 NOTE — Telephone Encounter (Signed)
Left voicemail on patient's number stating we had an opening on 5/27 with Dr. Selena Batten in our [place] office.

## 2020-11-03 NOTE — Telephone Encounter (Signed)
Looks like Kayla Calhoun tried to reach pt today.  Will forward note to him.

## 2020-11-03 NOTE — Telephone Encounter (Signed)
Routing to Noel so she can find the next available.   Malachi Bonds, MD [facility]

## 2020-11-03 NOTE — Telephone Encounter (Signed)
Pt is returning a call  

## 2020-11-08 LAB — ALLERGENS (95) FOODS IGE
Allergen Apple, IgE: <0.1 kU/L
Allergen Banana IgE: <0.1 kU/L
Allergen Barley IgE: <0.1 kU/L
Allergen Black Pepper IgE: <0.1 kU/L
Allergen Blueberry IgE: <0.1 kU/L
Allergen Broccoli: <0.1 kU/L
Allergen Cabbage IgE: <0.1 kU/L
Allergen Carrot IgE: <0.1 kU/L
Allergen Cauliflower IgE: <0.1 kU/L
Allergen Celery IgE: <0.1 kU/L
Allergen Cinnamon IgE: <0.1 kU/L
Allergen Coconut IgE: <0.1 kU/L
Allergen Corn, IgE: <0.1 kU/L
Allergen Cucumber IgE: <0.1 kU/L
Allergen Garlic IgE: <0.1 kU/L
Allergen Ginger IgE: <0.1 kU/L
Allergen Gluten IgE: <0.1 kU/L
Allergen Grape IgE: <0.1 kU/L
Allergen Grapefruit IgE: <0.1 kU/L
Allergen Green Bean IgE: <0.1 kU/L
Allergen Green Pea IgE: <0.1 kU/L
Allergen Lamb IgE: <0.1 kU/L
Allergen Lettuce IgE: <0.1 kU/L
Allergen Lime IgE: <0.1 kU/L
Allergen Melon IgE: <0.1 kU/L
Allergen Oat IgE: <0.1 kU/L
Allergen Onion IgE: <0.1 kU/L
Allergen Pear IgE: <0.1 kU/L
Allergen Potato, White IgE: <0.1 kU/L
Allergen Rice IgE: <0.1 kU/L
Allergen Salmon IgE: <0.1 kU/L
Allergen Strawberry IgE: <0.1 kU/L
Allergen Sweet Potato IgE: <0.1 kU/L
Allergen Tomato, IgE: <0.1 kU/L
Allergen Turkey IgE: <0.1 kU/L
Allergen Watermelon IgE: <0.1 kU/L
Allergen, Peach f95: <0.1 kU/L
Basil: <0.1 kU/L
Beef IgE: <0.1 kU/L
C074-IgE Gelatin: <0.1 kU/L
Chicken IgE: <0.1 kU/L
Chocolate/Cacao IgE: <0.1 kU/L
Clam IgE: <0.1 kU/L
Codfish IgE: <0.1 kU/L
Coffee: <0.1 kU/L
Cranberry IgE: <0.1 kU/L
Egg White IgE: <0.1 kU/L
F020-IgE Almond: <0.1 kU/L
F023-IgE Crab: <0.1 kU/L
F045-IgE Yeast: <0.1 kU/L
F076-IgE Alpha Lactalbumin: <0.1 kU/L
F077-IgE Beta Lactoglobulin: <0.1 kU/L
F078-IgE Casein: <0.1 kU/L
F080-IgE Lobster: <0.1 kU/L
F081-IgE Cheese, Cheddar Type: <0.1 kU/L
F089-IgE Mustard: <0.1 kU/L
F096-IgE Avocado: <0.1 kU/L
F202-IgE Cashew Nut: <0.1 kU/L
F214-IgE Spinach: <0.1 kU/L
F222-IgE Tea: <0.1 kU/L
F242-IgE Bing Cherry: <0.1 kU/L
F261-IgE Asparagus: <0.1 kU/L
F262-IgE Eggplant: <0.1 kU/L
F265-IgE Cumin: <0.1 kU/L
F278-IgE Bayleaf (Laurel): <0.1 kU/L
F279-IgE Chili Pepper: <0.1 kU/L
F283-IgE Oregano: <0.1 kU/L
F300-IgE Goat's Milk: <0.1 kU/L
F342-IgE Olive, Black: <0.1 kU/L
F343-IgE Raspberry: <0.1 kU/L
Hops: <0.1 kU/L
IgE Egg (Yolk): <0.1 kU/L
Kidney Bean IgE: <0.1 kU/L
Lemon: <0.1 kU/L
Lima Bean IgE: <0.1 kU/L
Malt: <0.1 kU/L
Mushroom IgE: <0.1 kU/L
Orange: <0.1 kU/L
Paprika IgE: <0.1 kU/L
Peanut IgE: <0.1 kU/L
Pineapple IgE: <0.1 kU/L
Pork IgE: <0.1 kU/L
Pumpkin IgE: <0.1 kU/L
Red Beet: <0.1 kU/L
Rye IgE: <0.1 kU/L
Scallop IgE: <0.1 kU/L
Sesame Seed IgE: <0.1 kU/L
Shrimp IgE: <0.1 kU/L
Soybean IgE: <0.1 kU/L
Tuna: <0.1 kU/L
Vanilla: <0.1 kU/L
Walnut IgE: <0.1 kU/L
Wheat IgE: <0.1 kU/L
Whey: <0.1 kU/L
White Bean IgE: <0.1 kU/L

## 2020-11-08 LAB — CMP14+EGFR
ALT: 11 IU/L (ref 0–32)
AST: 22 IU/L (ref 0–40)
Albumin/Globulin Ratio: 1.6 (ref 1.2–2.2)
Albumin: 4.1 g/dL (ref 3.6–4.6)
Alkaline Phosphatase: 55 IU/L (ref 44–121)
BUN/Creatinine Ratio: 14 (ref 12–28)
BUN: 11 mg/dL (ref 8–27)
Bilirubin Total: 0.7 mg/dL (ref 0.0–1.2)
CO2: 24 mmol/L (ref 20–29)
Calcium: 9.7 mg/dL (ref 8.7–10.3)
Chloride: 102 mmol/L (ref 96–106)
Creatinine, Ser: 0.81 mg/dL (ref 0.57–1.00)
Globulin, Total: 2.5 g/dL (ref 1.5–4.5)
Glucose: 101 mg/dL — ABNORMAL HIGH (ref 65–99)
Potassium: 4.5 mmol/L (ref 3.5–5.2)
Sodium: 141 mmol/L (ref 134–144)
Total Protein: 6.6 g/dL (ref 6.0–8.5)
eGFR: 72 mL/min/{1.73_m2} (ref >59)

## 2020-11-08 LAB — CBC WITH DIFFERENTIAL/PLATELET
Basophils Absolute: 0 10*3/uL (ref 0.0–0.2)
Basos: 1 %
EOS (ABSOLUTE): 0.1 10*3/uL (ref 0.0–0.4)
Eos: 3 %
Hematocrit: 36.3 % (ref 34.0–46.6)
Hemoglobin: 12.1 g/dL (ref 11.1–15.9)
Immature Grans (Abs): 0 10*3/uL (ref 0.0–0.1)
Immature Granulocytes: 0 %
Lymphocytes Absolute: 1 10*3/uL (ref 0.7–3.1)
Lymphs: 28 %
MCH: 29.4 pg (ref 26.6–33.0)
MCHC: 33.3 g/dL (ref 31.5–35.7)
MCV: 88 fL (ref 79–97)
Monocytes Absolute: 0.3 10*3/uL (ref 0.1–0.9)
Monocytes: 9 %
Neutrophils Absolute: 2.1 10*3/uL (ref 1.4–7.0)
Neutrophils: 59 %
Platelets: 302 10*3/uL (ref 150–450)
RBC: 4.11 x10E6/uL (ref 3.77–5.28)
RDW: 13.5 % (ref 11.7–15.4)
WBC: 3.6 10*3/uL (ref 3.4–10.8)

## 2020-11-08 LAB — LIPID PANEL
Chol/HDL Ratio: 3.1 ratio (ref 0.0–4.4)
Cholesterol, Total: 172 mg/dL (ref 100–199)
HDL: 56 mg/dL (ref >39)
LDL Chol Calc (NIH): 104 mg/dL — ABNORMAL HIGH (ref 0–99)
Triglycerides: 62 mg/dL (ref 0–149)
VLDL Cholesterol Cal: 12 mg/dL (ref 5–40)

## 2020-11-08 LAB — HEMOGLOBIN A1C
Est. average glucose Bld gHb Est-mCnc: 128 mg/dL
Hgb A1c MFr Bld: 6.1 % — ABNORMAL HIGH (ref 4.8–5.6)

## 2020-11-11 ENCOUNTER — Ambulatory Visit: Payer: Medicare Other | Admitting: Allergy

## 2020-11-12 NOTE — Addendum Note (Signed)
Encounter addended by: Novella Olive on: 11/12/2020 1:50 PM  Actions taken: Letter saved

## 2020-11-15 DIAGNOSIS — H401112 Primary open-angle glaucoma, right eye, moderate stage: Secondary | ICD-10-CM | POA: Diagnosis not present

## 2020-11-16 ENCOUNTER — Ambulatory Visit (INDEPENDENT_AMBULATORY_CARE_PROVIDER_SITE_OTHER): Payer: Medicare Other

## 2020-11-16 VITALS — Ht 62.0 in | Wt 177.0 lb

## 2020-11-16 DIAGNOSIS — Z0000 Encounter for general adult medical examination without abnormal findings: Secondary | ICD-10-CM | POA: Diagnosis not present

## 2020-11-16 NOTE — Progress Notes (Signed)
I connected with Bertram [name] today by telephone and verified that I am speaking with the correct person using two identifiers. Location patient: home Location provider: work Persons participating in the virtual visit: [place], Gassert LPN.   I discussed the limitations, risks, security and privacy concerns of performing an evaluation and management service by telephone and the availability of in person appointments. I also discussed with the patient that there may be a patient responsible charge related to this service. The patient expressed understanding and verbally consented to this telephonic visit.    Interactive audio and video telecommunications were attempted between this provider and patient, however failed, due to patient having technical difficulties OR patient did not have access to video capability.  We continued and completed visit with audio only.     Vital signs may be patient reported or missing.  Subjective:   Kayla Calhoun is a 80 y.o. female who presents for Medicare Annual (Subsequent) preventive examination.  Review of Systems     Cardiac Risk Factors include: advanced age (>3men, >66 women);hypertension;obesity (BMI >30kg/m2);sedentary lifestyle     Objective:    Today's Vitals   11/16/20 1209  Weight: 177 lb (80.3 kg)  Height: 5\' 2"  (1.575 m)   Body mass index is 32.37 kg/m.  Advanced Directives 11/16/2020 10/28/2019 10/22/2018  Does Patient Have a Medical Advance Directive? Yes Yes Yes  Type of 12/22/2018 of [place];Living will Healthcare Power of [place];Living will Healthcare Power of [place];Living will  Copy of Healthcare Power of Attorney in Chart? No - copy requested No - copy requested No - copy requested    Current Medications (verified) Outpatient Encounter Medications as of 11/16/2020  Medication Sig  . amLODipine (NORVASC) 5 MG tablet TAKE 1 TABLET BY MOUTH EVERY DAY  . Cholecalciferol  (VITAMIN D3) 50 MCG (2000 UT) TABS Take by mouth daily.  01/16/2021 ELIQUIS 5 MG TABS tablet TAKE 1 TABLET BY MOUTH TWICE A DAY  . levobunolol (BETAGAN) 0.5 % ophthalmic solution PLACE 1 DROP INTO BOTH EYES TWICE A DAY  . metoprolol succinate (TOPROL-XL) 50 MG 24 hr tablet Take 1 tablet (50 mg total) by mouth daily. Take with or immediately following a meal.  . TRAVATAN Z 0.004 % SOLN ophthalmic solution   . triamterene-hydrochlorothiazide (MAXZIDE-25) 37.5-25 MG tablet TAKE 1/2 TABLET BY MOUTH EVERY DAY  . vitamin C (ASCORBIC ACID) 500 MG tablet Take 500 mg by mouth daily.  . hydrOXYzine (ATARAX/VISTARIL) 10 MG tablet One tab twice daily as needed for itching (Patient not taking: Reported on 11/16/2020)   No facility-administered encounter medications on file as of 11/16/2020.    Allergies (verified) Patient has no known allergies.   History: Past Medical History:  Diagnosis Date  . Atrial fibrillation (HCC)   . Hypertension   . Vitamin D deficiency    Past Surgical History:  Procedure Laterality Date  . ABDOMINAL HYSTERECTOMY    . KIDNEY SURGERY    . TONSILLECTOMY     Family History  Problem Relation Age of Onset  . Stroke Mother   . Prostate cancer Father   . Stroke Sister   . Hypertension Sister   . Hypertension Brother   . Stroke Brother    Social History   Socioeconomic History  . Marital status: Widowed    Spouse name: Not on file  . Number of children: 1  . Years of education: Not on file  . Highest education level: Not on file  Occupational History  .  Occupation: retired  Tobacco Use  . Smoking status: Former Smoker    Years: 10.00    Types: Cigarettes  . Smokeless tobacco: Never Used  . Tobacco comment: been quit 35 years  Vaping Use  . Vaping Use: Never used  Substance and Sexual Activity  . Alcohol use: Not Currently  . Drug use: Never  . Sexual activity: Not Currently  Other Topics Concern  . Not on file  Social History Narrative  . Not on file   Social  Determinants of Health   Financial Resource Strain: Low Risk   . Difficulty of Paying Living Expenses: Not hard at all  Food Insecurity: No Food Insecurity  . Worried About Programme researcher, broadcasting/film/video in the Last Year: Never true  . Ran Out of Food in the Last Year: Never true  Transportation Needs: No Transportation Needs  . Lack of Transportation (Medical): No  . Lack of Transportation (Non-Medical): No  Physical Activity: Inactive  . Days of Exercise per Week: 0 days  . Minutes of Exercise per Session: 0 min  Stress: No Stress Concern Present  . Feeling of Stress : Not at all  Social Connections: Not on file    Tobacco Counseling Counseling given: Not Answered Comment: been quit 35 years   Clinical Intake:  Pre-visit preparation completed: Yes  Pain : No/denies pain     Nutritional Status: BMI > 30  Obese Nutritional Risks: None Diabetes: No  How often do you need to have someone help you when you read instructions, pamphlets, or other written materials from your doctor or pharmacy?: 1 - Never What is the last grade level you completed in school?: 76yr college  Diabetic? no  Interpreter Needed?: No  Information entered by :: NAllen LPN   Activities of Daily Living In your present state of health, do you have any difficulty performing the following activities: 11/16/2020 11/02/2020  Hearing? Malvin Johns  Vision? N N  Difficulty concentrating or making decisions? N N  Walking or climbing stairs? N Y  Dressing or bathing? N N  Doing errands, shopping? N N  Preparing Food and eating ? N -  Using the Toilet? N -  In the past six months, have you accidently leaked urine? Y -  Comment with sneezing or coughing -  Do you have problems with loss of bowel control? N -  Managing your Medications? N -  Managing your Finances? N -  Housekeeping or managing your Housekeeping? N -  Some recent data might be hidden    Patient Care Team: Dorothyann Peng, MD as PCP - General (Internal  Medicine) Lanier Prude, MD as PCP - Electrophysiology (Cardiology)  Indicate any recent Medical Services you may have received from other than [facility] providers in the past year (date may be approximate).     Assessment:   This is a routine wellness examination for Kayla Calhoun.  Hearing/Vision screen  Hearing Screening   125Hz  250Hz  500Hz  1000Hz  2000Hz  3000Hz  4000Hz  6000Hz  8000Hz   Right ear:           Left ear:           Vision Screening Comments: Regular eye exams, Dr.  Dietary issues and exercise activities discussed: Current Exercise Habits: The patient does not participate in regular exercise at present  Goals Addressed            This Visit's Progress   . Patient Stated       11/16/2020, no goals  Depression Screen PHQ 2/9 Scores 11/16/2020 11/02/2020 10/28/2019 10/28/2019 10/22/2018 03/27/2018  PHQ - 2 Score 0 0 0 0 0 0  PHQ- 9 Score - - 0 - 0 -    Fall Risk Fall Risk  11/16/2020 11/02/2020 10/28/2019 10/28/2019 04/28/2019  Falls in the past year? 0 0 0 0 0  Comment - - - - -  Number falls in past yr: - - - 0 -  Injury with Fall? - - - 0 -  Risk for fall due to : Medication side effect - Medication side effect - -  Follow up Falls evaluation completed;Education provided;Falls prevention discussed - - - -    FALL RISK PREVENTION PERTAINING TO THE HOME:  Any stairs in or around the home? Yes  If so, are there any without handrails? No  Home free of loose throw rugs in walkways, pet beds, electrical cords, etc? Yes  Adequate lighting in your home to reduce risk of falls? Yes   ASSISTIVE DEVICES UTILIZED TO PREVENT FALLS:  Life alert? No  Use of a cane, walker or w/c? No  Grab bars in the bathroom? No  Shower chair or bench in shower? No  Elevated toilet seat or a handicapped toilet? No   TIMED UP AND GO:  Was the test performed? no.    Cognitive Function:     6CIT Screen 11/16/2020 10/28/2019 10/22/2018  What Year? 0 points 0 points 0 points  What  month? 0 points 0 points 0 points  What time? 0 points 0 points 0 points  Count back from 20 0 points 0 points 0 points  Months in reverse 0 points 0 points 0 points  Repeat phrase 0 points 0 points 0 points  Total Score 0 0 0    Immunizations Immunization History  Administered Date(s) Administered  . PFIZER(Purple Top)SARS-COV-2 Vaccination 08/20/2019, 09/15/2019, 04/16/2020  . Pneumococcal Conjugate-13 11/19/2019  . Pneumococcal Polysaccharide-23 03/27/2018    TDAP status: Up to date  Flu Vaccine status: Up to date  Pneumococcal vaccine status: Up to date  Covid-19 vaccine status: Completed vaccines  Qualifies for Shingles Vaccine? Yes   Zostavax completed No   Shingrix Completed?: No.    Education has been provided regarding the importance of this vaccine. Patient has been advised to call insurance company to determine out of pocket expense if they have not yet received this vaccine. Advised may also receive vaccine at local pharmacy or Health Dept. Verbalized acceptance and understanding.  Screening Tests Health Maintenance  Topic Date Due  . Zoster Vaccines- Shingrix (1 of 2) Never done  . INFLUENZA VACCINE  01/16/2021  . TETANUS/TDAP  06/30/2022  . DEXA SCAN  Completed  . COVID-19 Vaccine  Completed  . PNA vac Low Risk Adult  Completed  . HPV VACCINES  Aged Out    Health Maintenance  Health Maintenance Due  Topic Date Due  . Zoster Vaccines- Shingrix (1 of 2) Never done    Colorectal cancer screening: No longer required.   Mammogram status: No longer required due to age.  Bone Density status: Completed 01/28/2019.   Lung Cancer Screening: (Low Dose CT Chest recommended if Age 37-80 years, 30 pack-year currently smoking OR have quit w/in 15years.) does not qualify.   Lung Cancer Screening Referral: no  Additional Screening:  Hepatitis C Screening: does not qualify;   Vision Screening: Recommended annual ophthalmology exams for early detection of  glaucoma and other disorders of the eye. Is the patient up to date with  their annual eye exam?  Yes  Who is the provider or what is the name of the office in which the patient attends annual eye exams? Dr. Burgess Estelle If pt is not established with a provider, would they like to be referred to a provider to establish care? No .   Dental Screening: Recommended annual dental exams for proper oral hygiene  Community Resource Referral / Chronic Care Management: CRR required this visit?  No   CCM required this visit?  No      Plan:     I have personally reviewed and noted the following in the patient's chart:   . Medical and social history . Use of alcohol, tobacco or illicit drugs  . Current medications and supplements including opioid prescriptions.  . Functional ability and status . Nutritional status . Physical activity . Advanced directives . List of other physicians . Hospitalizations, surgeries, and ER visits in previous 12 months . Vitals . Screenings to include cognitive, depression, and falls . Referrals and appointments  In addition, I have reviewed and discussed with patient certain preventive protocols, quality metrics, and best practice recommendations. A written personalized care plan for preventive services as well as general preventive health recommendations were provided to patient.     Barb Merino, LPN   08/18/9922   Nurse Notes:

## 2020-11-16 NOTE — Patient Instructions (Signed)
Kayla Calhoun , Thank you for taking time to come for your Medicare Wellness Visit. I appreciate your ongoing commitment to your health goals. Please review the following plan we discussed and let me know if I can assist you in the future.   Screening recommendations/referrals: Colonoscopy: not required Mammogram: not required Bone Density: completed 01/28/2019 Recommended yearly ophthalmology/optometry visit for glaucoma screening and checkup Recommended yearly dental visit for hygiene and checkup  Vaccinations: Influenza vaccine: decline Pneumococcal vaccine: completed 11/19/2019 Tdap vaccine: completed 06/30/2012, due 06/30/2022 Shingles vaccine: discussed   Covid-19: 04/16/2020, 09/15/2019, 08/20/2019  Advanced directives: Please bring a copy of your POA (Power of Attorney) and/or Living Will to your next appointment.   Conditions/risks identified: none  Next appointment: Follow up in one year for your annual wellness visit    Preventive Care 65 Years and Older, Female Preventive care refers to lifestyle choices and visits with your health care provider that can promote health and wellness. What does preventive care include?  A yearly physical exam. This is also called an annual well check.  Dental exams once or twice a year.  Routine eye exams. Ask your health care provider how often you should have your eyes checked.  Personal lifestyle choices, including:  Daily care of your teeth and gums.  Regular physical activity.  Eating a healthy diet.  Avoiding tobacco and drug use.  Limiting alcohol use.  Practicing safe sex.  Taking low-dose aspirin every day.  Taking vitamin and mineral supplements as recommended by your health care provider. What happens during an annual well check? The services and screenings done by your health care provider during your annual well check will depend on your age, overall health, lifestyle risk factors, and family history of  disease. Counseling  Your health care provider may ask you questions about your:  Alcohol use.  Tobacco use.  Drug use.  Emotional well-being.  Home and relationship well-being.  Sexual activity.  Eating habits.  History of falls.  Memory and ability to understand (cognition).  Work and work Astronomer.  Reproductive health. Screening  You may have the following tests or measurements:  Height, weight, and BMI.  Blood pressure.  Lipid and cholesterol levels. These may be checked every 5 years, or more frequently if you are over 55 years old.  Skin check.  Lung cancer screening. You may have this screening every year starting at age 70 if you have a 30-pack-year history of smoking and currently smoke or have quit within the past 15 years.  Fecal occult blood test (FOBT) of the stool. You may have this test every year starting at age 26.  Flexible sigmoidoscopy or colonoscopy. You may have a sigmoidoscopy every 5 years or a colonoscopy every 10 years starting at age 22.  Hepatitis C blood test.  Hepatitis B blood test.  Sexually transmitted disease (STD) testing.  Diabetes screening. This is done by checking your blood sugar (glucose) after you have not eaten for a while (fasting). You may have this done every 1-3 years.  Bone density scan. This is done to screen for osteoporosis. You may have this done starting at age 64.  Mammogram. This may be done every 1-2 years. Talk to your health care provider about how often you should have regular mammograms. Talk with your health care provider about your test results, treatment options, and if necessary, the need for more tests. Vaccines  Your health care provider may recommend certain vaccines, such as:  Influenza vaccine. This is  recommended every year.  Tetanus, diphtheria, and acellular pertussis (Tdap, Td) vaccine. You may need a Td booster every 10 years.  Zoster vaccine. You may need this after age  76.  Pneumococcal 13-valent conjugate (PCV13) vaccine. One dose is recommended after age 2.  Pneumococcal polysaccharide (PPSV23) vaccine. One dose is recommended after age 69. Talk to your health care provider about which screenings and vaccines you need and how often you need them. This information is not intended to replace advice given to you by your health care provider. Make sure you discuss any questions you have with your health care provider. Document Released: 07/01/2015 Document Revised: 02/22/2016 Document Reviewed: 04/05/2015 Elsevier Interactive Patient Education  2017 [place] Prevention in the Home Falls can cause injuries. They can happen to people of all ages. There are many things you can do to make your home safe and to help prevent falls. What can I do on the outside of my home?  Regularly fix the edges of walkways and driveways and fix any cracks.  Remove anything that might make you trip as you walk through a door, such as a raised step or threshold.  Trim any bushes or trees on the path to your home.  Use bright outdoor lighting.  Clear any walking paths of anything that might make someone trip, such as rocks or tools.  Regularly check to see if handrails are loose or broken. Make sure that both sides of any steps have handrails.  Any raised decks and porches should have guardrails on the edges.  Have any leaves, snow, or ice cleared regularly.  Use sand or salt on walking paths during winter.  Clean up any spills in your garage right away. This includes oil or grease spills. What can I do in the bathroom?  Use night lights.  Install grab bars by the toilet and in the tub and shower. Do not use towel bars as grab bars.  Use non-skid mats or decals in the tub or shower.  If you need to sit down in the shower, use a plastic, non-slip stool.  Keep the floor dry. Clean up any water that spills on the floor as soon as it happens.  Remove  soap buildup in the tub or shower regularly.  Attach bath mats securely with double-sided non-slip rug tape.  Do not have throw rugs and other things on the floor that can make you trip. What can I do in the bedroom?  Use night lights.  Make sure that you have a light by your bed that is easy to reach.  Do not use any sheets or blankets that are too big for your bed. They should not hang down onto the floor.  Have a firm chair that has side arms. You can use this for support while you get dressed.  Do not have throw rugs and other things on the floor that can make you trip. What can I do in the kitchen?  Clean up any spills right away.  Avoid walking on wet floors.  Keep items that you use a lot in easy-to-reach places.  If you need to reach something above you, use a strong step stool that has a grab bar.  Keep electrical cords out of the way.  Do not use floor polish or wax that makes floors slippery. If you must use wax, use non-skid floor wax.  Do not have throw rugs and other things on the floor that can make you trip.  What can I do with my stairs?  Do not leave any items on the stairs.  Make sure that there are handrails on both sides of the stairs and use them. Fix handrails that are broken or loose. Make sure that handrails are as long as the stairways.  Check any carpeting to make sure that it is firmly attached to the stairs. Fix any carpet that is loose or worn.  Avoid having throw rugs at the top or bottom of the stairs. If you do have throw rugs, attach them to the floor with carpet tape.  Make sure that you have a light switch at the top of the stairs and the bottom of the stairs. If you do not have them, ask someone to add them for you. What else can I do to help prevent falls?  Wear shoes that:  Do not have high heels.  Have rubber bottoms.  Are comfortable and fit you well.  Are closed at the toe. Do not wear sandals.  If you use a  stepladder:  Make sure that it is fully opened. Do not climb a closed stepladder.  Make sure that both sides of the stepladder are locked into place.  Ask someone to hold it for you, if possible.  Clearly mark and make sure that you can see:  Any grab bars or handrails.  First and last steps.  Where the edge of each step is.  Use tools that help you move around (mobility aids) if they are needed. These include:  Canes.  Walkers.  Scooters.  Crutches.  Turn on the lights when you go into a dark area. Replace any light bulbs as soon as they burn out.  Set up your furniture so you have a clear path. Avoid moving your furniture around.  If any of your floors are uneven, fix them.  If there are any pets around you, be aware of where they are.  Review your medicines with your doctor. Some medicines can make you feel dizzy. This can increase your chance of falling. Ask your doctor what other things that you can do to help prevent falls. This information is not intended to replace advice given to you by your health care provider. Make sure you discuss any questions you have with your health care provider. Document Released: 03/31/2009 Document Revised: 11/10/2015 Document Reviewed: 07/09/2014 Elsevier Interactive Patient Education  2017 [employer].

## 2020-11-21 ENCOUNTER — Other Ambulatory Visit: Payer: Self-pay | Admitting: Cardiology

## 2020-11-21 DIAGNOSIS — I4819 Other persistent atrial fibrillation: Secondary | ICD-10-CM

## 2020-11-21 NOTE — Telephone Encounter (Signed)
Pt's age 80, wt 80.3 kg, SCr 0.81, CrCl 67.88, last ov w/ Cl; 09/27/20.

## 2020-11-28 ENCOUNTER — Other Ambulatory Visit: Payer: Self-pay | Admitting: Internal Medicine

## 2020-11-28 ENCOUNTER — Encounter (HOSPITAL_COMMUNITY): Payer: Self-pay | Admitting: Nurse Practitioner

## 2020-12-05 NOTE — Telephone Encounter (Signed)
Error

## 2020-12-20 ENCOUNTER — Encounter (HOSPITAL_COMMUNITY): Payer: Self-pay | Admitting: Physician Assistant

## 2020-12-20 ENCOUNTER — Ambulatory Visit (HOSPITAL_COMMUNITY)
Admission: RE | Admit: 2020-12-20 | Discharge: 2020-12-20 | Disposition: A | Discharge status: Home / Self Care | Payer: Medicare Other | Source: Ambulatory Visit | Attending: Physician Assistant | Admitting: Physician Assistant

## 2020-12-20 ENCOUNTER — Ambulatory Visit (HOSPITAL_BASED_OUTPATIENT_CLINIC_OR_DEPARTMENT_OTHER)
Admission: RE | Admit: 2020-12-20 | Discharge: 2020-12-20 | Disposition: A | Discharge status: Home / Self Care | Payer: Medicare Other | Source: Ambulatory Visit | Attending: Physician Assistant | Admitting: Physician Assistant

## 2020-12-20 ENCOUNTER — Other Ambulatory Visit (HOSPITAL_COMMUNITY)
Admission: RE | Admit: 2020-12-20 | Discharge: 2020-12-20 | Disposition: A | Discharge status: Home / Self Care | Payer: Medicare Other | Source: Ambulatory Visit | Attending: Cardiology | Admitting: Cardiology

## 2020-12-20 ENCOUNTER — Other Ambulatory Visit (HOSPITAL_COMMUNITY): Payer: Medicare Other

## 2020-12-20 ENCOUNTER — Other Ambulatory Visit: Payer: Self-pay

## 2020-12-20 VITALS — BP 120/86 | HR 83 | Ht 62.0 in | Wt 183.0 lb

## 2020-12-20 DIAGNOSIS — Z20822 Contact with and (suspected) exposure to covid-19: Secondary | ICD-10-CM | POA: Diagnosis not present

## 2020-12-20 DIAGNOSIS — I4819 Other persistent atrial fibrillation: Secondary | ICD-10-CM | POA: Diagnosis not present

## 2020-12-20 DIAGNOSIS — D6869 Other thrombophilia: Secondary | ICD-10-CM | POA: Diagnosis not present

## 2020-12-20 DIAGNOSIS — I517 Cardiomegaly: Secondary | ICD-10-CM | POA: Diagnosis not present

## 2020-12-20 DIAGNOSIS — Z01818 Encounter for other preprocedural examination: Secondary | ICD-10-CM | POA: Insufficient documentation

## 2020-12-20 DIAGNOSIS — Z79899 Other long term (current) drug therapy: Secondary | ICD-10-CM | POA: Insufficient documentation

## 2020-12-20 DIAGNOSIS — Z8249 Family history of ischemic heart disease and other diseases of the circulatory system: Secondary | ICD-10-CM | POA: Diagnosis not present

## 2020-12-20 DIAGNOSIS — I340 Nonrheumatic mitral (valve) insufficiency: Secondary | ICD-10-CM | POA: Diagnosis not present

## 2020-12-20 DIAGNOSIS — Z87891 Personal history of nicotine dependence: Secondary | ICD-10-CM | POA: Insufficient documentation

## 2020-12-20 DIAGNOSIS — I4891 Unspecified atrial fibrillation: Secondary | ICD-10-CM | POA: Diagnosis not present

## 2020-12-20 DIAGNOSIS — Z90710 Acquired absence of both cervix and uterus: Secondary | ICD-10-CM | POA: Diagnosis not present

## 2020-12-20 DIAGNOSIS — Z7901 Long term (current) use of anticoagulants: Secondary | ICD-10-CM | POA: Diagnosis not present

## 2020-12-20 DIAGNOSIS — E559 Vitamin D deficiency, unspecified: Secondary | ICD-10-CM | POA: Diagnosis not present

## 2020-12-20 DIAGNOSIS — Z8679 Personal history of other diseases of the circulatory system: Secondary | ICD-10-CM | POA: Diagnosis not present

## 2020-12-20 DIAGNOSIS — I10 Essential (primary) hypertension: Secondary | ICD-10-CM | POA: Insufficient documentation

## 2020-12-20 DIAGNOSIS — Z6833 Body mass index (BMI) 33.0-33.9, adult: Secondary | ICD-10-CM | POA: Insufficient documentation

## 2020-12-20 DIAGNOSIS — E669 Obesity, unspecified: Secondary | ICD-10-CM | POA: Diagnosis not present

## 2020-12-20 LAB — CBC
HCT: 39 % (ref 36.0–46.0)
Hemoglobin: 12.1 g/dL (ref 12.0–15.0)
MCH: 29.3 pg (ref 26.0–34.0)
MCHC: 31 g/dL (ref 30.0–36.0)
MCV: 94.4 fL (ref 80.0–100.0)
Platelets: 236 10*3/uL (ref 150–400)
RBC: 4.13 MIL/uL (ref 3.87–5.11)
RDW: 14 % (ref 11.5–15.5)
WBC: 3.9 10*3/uL — ABNORMAL LOW (ref 4.0–10.5)
nRBC: 0 % (ref 0.0–0.2)

## 2020-12-20 LAB — BASIC METABOLIC PANEL
Anion gap: 7 (ref 5–15)
BUN: 9 mg/dL (ref 8–23)
CO2: 26 mmol/L (ref 22–32)
Calcium: 9.4 mg/dL (ref 8.9–10.3)
Chloride: 103 mmol/L (ref 98–111)
Creatinine, Ser: 0.78 mg/dL (ref 0.44–1.00)
GFR, Estimated: >60 mL/min (ref >60)
Glucose, Bld: 99 mg/dL (ref 70–99)
Potassium: 3.9 mmol/L (ref 3.5–5.1)
Sodium: 136 mmol/L (ref 135–145)

## 2020-12-20 MED ORDER — PREDNISONE 50 MG PO TABS: ORAL_TABLET | ORAL | 0 refills | Status: DC

## 2020-12-20 MED ORDER — ASPIRIN EC 81 MG PO TBEC: 81.0000 mg | DELAYED_RELEASE_TABLET | Freq: Every day | ORAL | 2 refills | Status: DC

## 2020-12-20 NOTE — Patient Instructions (Signed)
LEFT ATRIAL APPENDAGE CLOSURE INSTRUCTIONS:  You are scheduled for a Left Atrial Appendage Device Implantation on Thursday, July 7th  1. Please arrive at the [facility] (Main Entrance A) at [facility]: [address] [place], [place] [address] at 730AM   (This time is two hours before your procedure to ensure your preparation). Free valet parking service is available. You are allowed ONE visitor in the waiting room during your procedure. Both you and your guest must wear masks. Special note: Every effort is made to have your procedure done on time. Please understand that emergencies sometimes delay scheduled procedures.  2.   Do not eat or drink after midnight prior to your procedure.     3.   Medication Instructions: Take ONLY the listed medications morning of procedure with a sip of water: amlodipine, metoprolol, maxide, aspirin, prednisone & benadryl   ??Contrast Allergy: Yes, Please take Prednisone 50mg  by mouth at: Thirteen hours prior to cath 9:00pm on Wednesday Seven hours prior to cath 3:00am on Thursday And prior to leaving home please take last dose of Prednisone 50mg  and Benadryl 50mg  by mouth.    4. Plan for one night stay--bring personal belongings. When you are discharged, you will need someone to drive you home and stay with you for 24 hours.  5. Bring a current list of your medications and current insurance cards.  6. Please wear clothes that are easy to get on and off and wear slip-on shoes.  7. Monday, the [facility], will arrange follow-up for you during your hospital stay and you will be discharged with your appointment dates/times. His direct number is [phone] if you need assistance.  **If you have any questions, do not hesitate to call the office! You will also be contacted the week of your procedure to confirm instructions.**

## 2020-12-20 NOTE — Progress Notes (Signed)
Primary Care Physician: Dorothyann Peng, MD Primary Cardiologist: Dr Anne Fu Primary Electrophysiologist: Dr Lalla Brothers Referring Physician: Dr Nicola Police is a 80 y.o. female with a history of HTN, SAH in 2009, and atrial fibrillation who presents for follow up in the [facility]. Patient is on Eliquis for a CHADS2VASC score of 6. Patient was seen by Dr Lalla Brothers for [facility] device consideration. She is not felt to be a candidate for long term anticoagulation due to h/o SAH. She has been on Eliquis and currently tolerating without any bleeding issues. She is scheduled for Watchman implant on 12/22/20.  Today, she denies symptoms of palpitations, chest pain, shortness of breath, orthopnea, PND, lower extremity edema, dizziness, presyncope, syncope, snoring, daytime somnolence, bleeding, or neurologic sequela. The patient is tolerating medications without difficulties and is otherwise without complaint today.    Atrial Fibrillation Risk Factors:  she does not have symptoms or diagnosis of sleep apnea. she does not have a history of rheumatic fever.   she has a BMI of Body mass index is 33.47 kg/m.Marland Kitchen Filed Weights   12/20/20 1327  Weight: 83 kg    Family History  Problem Relation Age of Onset   Stroke Mother    Prostate cancer Father    Stroke Sister    Hypertension Sister    Hypertension Brother    Stroke Brother      Atrial Fibrillation Management history:  Previous antiarrhythmic drugs: none Previous cardioversions: none Previous ablations: none CHADS2VASC score: 6 Anticoagulation history: Eliquis   Past Medical History:  Diagnosis Date   Atrial fibrillation (HCC)    Hypertension    Vitamin D deficiency    Past Surgical History:  Procedure Laterality Date   ABDOMINAL HYSTERECTOMY     KIDNEY SURGERY     TONSILLECTOMY      Current Outpatient Medications  Medication Sig Dispense Refill   acetaminophen (TYLENOL) 500 MG  tablet Take 500 mg by mouth every 6 (six) hours as needed for mild pain or moderate pain.     ALPHAGAN P 0.1 % SOLN Place 1 drop into both eyes in the morning and at bedtime.     amLODipine (NORVASC) 5 MG tablet TAKE 1 TABLET BY MOUTH EVERY DAY 90 tablet 2   Ascorbic Acid (VITAMIN C) 1000 MG tablet Take 1,000 mg by mouth every other day. In the morning     Cholecalciferol (VITAMIN D3) 50 MCG (2000 UT) TABS Take 2,000 Units by mouth daily.     ELIQUIS 5 MG TABS tablet TAKE 1 TABLET BY MOUTH TWICE A DAY 60 tablet 2   hydrOXYzine (ATARAX/VISTARIL) 10 MG tablet TAKE 1 TABLET BY MOUTH TWICE A DAY AS NEEDED FOR ITCHING 30 tablet 0   levobunolol (BETAGAN) 0.5 % ophthalmic solution Place 1 drop into both eyes 2 (two) times daily.     metoprolol succinate (TOPROL-XL) 50 MG 24 hr tablet Take 1 tablet (50 mg total) by mouth daily. Take with or immediately following a meal. (Patient taking differently: Take 50 mg by mouth daily. Take with or immediately following a meal.) 90 tablet 2   Noni, Morinda citrifolia, (NONI JUICE PO) Take 60 mLs by mouth in the morning.     TRAVATAN Z 0.004 % SOLN ophthalmic solution Place 1 drop into both eyes at bedtime.     triamterene-hydrochlorothiazide (MAXZIDE-25) 37.5-25 MG tablet TAKE 1/2 TABLET BY MOUTH EVERY DAY 90 tablet 1   vitamin E 180 MG (400 UNITS) capsule  Take 400 Units by mouth 2 (two) times a week.     No current facility-administered medications for this encounter.    Allergies  Allergen Reactions   Contrast Media [Iodinated Diagnostic Agents] Rash    Skin peeled (head to heel)    Social History   Socioeconomic History   Marital status: Widowed    Spouse name: Not on file   Number of children: 1   Years of education: Not on file   Highest education level: Not on file  Occupational History   Occupation: retired  Tobacco Use   Smoking status: Former    Years: 10.00    Pack years: 0.00    Types: Cigarettes   Smokeless tobacco: Never   Tobacco  comments:    been quit 35 years  Vaping Use   Vaping Use: Never used  Substance and Sexual Activity   Alcohol use: Not Currently   Drug use: Never   Sexual activity: Not Currently  Other Topics Concern   Not on file  Social History Narrative   Not on file   Social Determinants of Health   Financial Resource Strain: Low Risk    Difficulty of Paying Living Expenses: Not hard at all  Food Insecurity: No Food Insecurity   Worried About Programme researcher, broadcasting/film/video in the Last Year: Never true   Ran Out of Food in the Last Year: Never true  Transportation Needs: No Transportation Needs   Lack of Transportation (Medical): No   Lack of Transportation (Non-Medical): No  Physical Activity: Inactive   Days of Exercise per Week: 0 days   Minutes of Exercise per Session: 0 min  Stress: No Stress Concern Present   Feeling of Stress : Not at all  Social Connections: Not on file  Intimate Partner Violence: Not on file     ROS- All systems are reviewed and negative except as per the HPI above.  Physical Exam: Vitals:   12/20/20 1327  BP: 120/86  Pulse: 83  Weight: 83 kg  Height: 5\' 2"  (1.575 m)    GEN- The patient is well appearing obese elderly female, alert and oriented x 3 today.   Head- normocephalic, atraumatic Eyes-  Sclera clear, conjunctiva pink Ears- hearing intact Oropharynx- clear Neck- supple  Lungs- Clear to ausculation bilaterally, normal work of breathing Heart- irregular rate and rhythm, no murmurs, rubs or gallops  GI- soft, NT, ND, + BS Extremities- no clubbing, cyanosis. Trace bilateral edema MS- no significant deformity or atrophy Skin- no rash or lesion Psych- euthymic mood, full affect Neuro- strength and sensation are intact  Wt Readings from Last 3 Encounters:  12/20/20 83 kg  11/16/20 80.3 kg  11/02/20 89.1 kg    EKG today demonstrates  Afib Vent. rate 83 BPM PR interval * ms QRS duration 72 ms QT/QTcB 362/425 ms  Echo 10/26/20 demonstrated  1.  Left ventricular ejection fraction, by estimation, is 50 to 55%. The  left ventricle has low normal function. The left ventricle has no regional  wall motion abnormalities. Left ventricular diastolic parameters are  indeterminate.   2. Right ventricular systolic function is normal. The right ventricular  size is normal. There is moderately elevated pulmonary artery systolic  pressure.   3. Left atrial size was severely dilated.   4. Right atrial size was moderately dilated.   5. The mitral valve is normal in structure. Moderate mitral valve  regurgitation. No evidence of mitral stenosis.   6. The aortic valve is normal  in structure. Aortic valve regurgitation is  mild. No aortic stenosis is present.   7. The inferior vena cava is dilated in size with <50% respiratory  variability, suggesting right atrial pressure of 15 mmHg.   Comparison(s): No significant change from prior study. Prior images  reviewed side by side.   Cardiac CT 10/13/20 1. 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 2. A 24 mm Watchman FLX device is recommended based on the above landing zone measurements (20.8 mm maximum diameter; 13% compression). 3. There is no thrombus in the left atrial appendage on delayed imaging.  Epic records are reviewed at length today  HAS-BLED score 3 Hypertension Yes  Abnormal renal and liver function (Dialysis, transplant, Cr >2.26 mg/dL /Cirrhosis or Bilirubin >2x Normal or AST/ALT/AP >3x Normal) No  Stroke No  Bleeding Yes  Labile INR (Unstable/high INR) No  Elderly (>65) Yes  Drugs or alcohol (? 8 drinks/week, anti-plt or NSAID) No   CHA2DS2-VASc Score = 6  The patient's score is based upon: CHF History: No HTN History: Yes Diabetes History: No Stroke History: Yes [facility]) Vascular Disease History: No Age Score: 2 Gender Score: 1     ASSESSMENT AND PLAN: 1. Persistent Atrial Fibrillation (ICD10:  I48.19) The patient's CHA2DS2-VASc score is 6,  indicating a 9.7% annual risk of stroke.   She has a history of persistent atrial fibrillation. Unfortunately, She is not felt to be a long term anticoagulation candidate secondary to previous SAH.  Procedural risks for the Watchman implant have again been reviewed with the patient including a 0.5% risk of stroke, <1% risk of perforation, <1% risk of device embolization.   Patient scheduled for Watchman implant with Dr Lalla Brothers on 12/22/20  Continue Eliquis 5 mg BID, hold morning of procedure Start ASA 81 mg day of procedure Check CXR Check bmet/cbc Continue Toprol 50 mg daily Given contrast allergy, will do prednisone and benadryl prophylaxis.    2. Secondary Hypercoagulable State (ICD10:  D68.69) The patient is at significant risk for stroke/thromboembolism based upon her CHA2DS2-VASc Score of 6.  Continue Apixaban (Eliquis). See plans above.  3. Obesity Body mass index is 33.47 kg/m. Lifestyle modification was discussed at length including regular exercise and weight reduction.  4. HTN Stable, no changes today.   Follow up for Watchman implant on 12/22/20.   Jorja Loa PA-C [facility] [address] [place], [place] [address] [phone] 12/20/2020 1:58 PM

## 2020-12-21 ENCOUNTER — Encounter (HOSPITAL_COMMUNITY): Payer: Self-pay | Admitting: Cardiology

## 2020-12-21 LAB — SARS CORONAVIRUS 2 (TAT 6-24 HRS): SARS Coronavirus 2: NEGATIVE

## 2020-12-22 ENCOUNTER — Encounter (HOSPITAL_COMMUNITY): Payer: Self-pay | Admitting: Cardiology

## 2020-12-22 ENCOUNTER — Inpatient Hospital Stay (HOSPITAL_COMMUNITY): Payer: Medicare Other

## 2020-12-22 ENCOUNTER — Other Ambulatory Visit: Payer: Self-pay

## 2020-12-22 ENCOUNTER — Inpatient Hospital Stay (HOSPITAL_COMMUNITY): Payer: Medicare Other | Admitting: Physician Assistant

## 2020-12-22 ENCOUNTER — Inpatient Hospital Stay (HOSPITAL_COMMUNITY)
Admission: RE | Admit: 2020-12-22 | Discharge: 2020-12-23 | DRG: 274 | Disposition: A | Discharge status: Home / Self Care | Payer: Medicare Other | Attending: Cardiology | Admitting: Cardiology

## 2020-12-22 ENCOUNTER — Encounter (HOSPITAL_COMMUNITY)
Admission: RE | Disposition: A | Discharge status: Home / Self Care | Payer: Self-pay | Source: Home / Self Care | Attending: Cardiology

## 2020-12-22 DIAGNOSIS — Z90710 Acquired absence of both cervix and uterus: Secondary | ICD-10-CM | POA: Diagnosis not present

## 2020-12-22 DIAGNOSIS — D6869 Other thrombophilia: Secondary | ICD-10-CM | POA: Diagnosis present

## 2020-12-22 DIAGNOSIS — I4819 Other persistent atrial fibrillation: Secondary | ICD-10-CM | POA: Diagnosis not present

## 2020-12-22 DIAGNOSIS — Z6833 Body mass index (BMI) 33.0-33.9, adult: Secondary | ICD-10-CM

## 2020-12-22 DIAGNOSIS — Z91041 Radiographic dye allergy status: Secondary | ICD-10-CM | POA: Diagnosis not present

## 2020-12-22 DIAGNOSIS — I340 Nonrheumatic mitral (valve) insufficiency: Secondary | ICD-10-CM | POA: Diagnosis present

## 2020-12-22 DIAGNOSIS — Z87891 Personal history of nicotine dependence: Secondary | ICD-10-CM

## 2020-12-22 DIAGNOSIS — Z7901 Long term (current) use of anticoagulants: Secondary | ICD-10-CM | POA: Diagnosis not present

## 2020-12-22 DIAGNOSIS — E669 Obesity, unspecified: Secondary | ICD-10-CM | POA: Diagnosis not present

## 2020-12-22 DIAGNOSIS — I10 Essential (primary) hypertension: Secondary | ICD-10-CM | POA: Diagnosis present

## 2020-12-22 DIAGNOSIS — I4891 Unspecified atrial fibrillation: Secondary | ICD-10-CM | POA: Diagnosis present

## 2020-12-22 DIAGNOSIS — Z79899 Other long term (current) drug therapy: Secondary | ICD-10-CM | POA: Diagnosis not present

## 2020-12-22 DIAGNOSIS — E559 Vitamin D deficiency, unspecified: Secondary | ICD-10-CM | POA: Diagnosis present

## 2020-12-22 DIAGNOSIS — Z20822 Contact with and (suspected) exposure to covid-19: Secondary | ICD-10-CM | POA: Diagnosis present

## 2020-12-22 DIAGNOSIS — Z8249 Family history of ischemic heart disease and other diseases of the circulatory system: Secondary | ICD-10-CM

## 2020-12-22 DIAGNOSIS — Z8679 Personal history of other diseases of the circulatory system: Secondary | ICD-10-CM | POA: Diagnosis not present

## 2020-12-22 DIAGNOSIS — Z006 Encounter for examination for normal comparison and control in clinical research program: Secondary | ICD-10-CM

## 2020-12-22 DIAGNOSIS — I083 Combined rheumatic disorders of mitral, aortic and tricuspid valves: Secondary | ICD-10-CM | POA: Diagnosis not present

## 2020-12-22 HISTORY — DX: Unspecified glaucoma: H40.9

## 2020-12-22 HISTORY — PX: TEE WITHOUT CARDIOVERSION: SHX5443

## 2020-12-22 HISTORY — PX: LEFT ATRIAL APPENDAGE OCCLUSION: EP1229

## 2020-12-22 LAB — ECHO TEE: Radius: 0.5 cm

## 2020-12-22 LAB — SURGICAL PCR SCREEN
MRSA, PCR: NEGATIVE
Staphylococcus aureus: NEGATIVE

## 2020-12-22 LAB — ABO/RH: ABO/RH(D): O POS

## 2020-12-22 LAB — POCT ACTIVATED CLOTTING TIME: Activated Clotting Time: 341 seconds

## 2020-12-22 LAB — BASIC METABOLIC PANEL
Anion gap: 9 (ref 5–15)
BUN: 12 mg/dL (ref 8–23)
CO2: 25 mmol/L (ref 22–32)
Calcium: 9.9 mg/dL (ref 8.9–10.3)
Chloride: 102 mmol/L (ref 98–111)
Creatinine, Ser: 0.85 mg/dL (ref 0.44–1.00)
GFR, Estimated: >60 mL/min (ref >60)
Glucose, Bld: 165 mg/dL — ABNORMAL HIGH (ref 70–99)
Potassium: 4.1 mmol/L (ref 3.5–5.1)
Sodium: 136 mmol/L (ref 135–145)

## 2020-12-22 LAB — TYPE AND SCREEN
ABO/RH(D): O POS
Antibody Screen: NEGATIVE

## 2020-12-22 LAB — GLUCOSE, CAPILLARY: Glucose-Capillary: 139 mg/dL — ABNORMAL HIGH (ref 70–99)

## 2020-12-22 SURGERY — LEFT ATRIAL APPENDAGE OCCLUSION
Anesthesia: General

## 2020-12-22 MED ORDER — ACETAMINOPHEN 325 MG PO TABS: 650.0000 mg | ORAL_TABLET | ORAL | Status: DC | PRN

## 2020-12-22 MED ORDER — ONDANSETRON HCL 4 MG/2ML IJ SOLN: 4.0000 mg | Freq: Four times a day (QID) | INTRAMUSCULAR | Status: DC | PRN

## 2020-12-22 MED ORDER — CHLORHEXIDINE GLUCONATE 0.12 % MT SOLN
OROMUCOSAL | Status: AC
  Administered 2020-12-22: 15 mL via OROMUCOSAL
  Filled 2020-12-22: qty 15

## 2020-12-22 MED ORDER — APIXABAN 5 MG PO TABS
5.0000 mg | ORAL_TABLET | Freq: Two times a day (BID) | ORAL | Status: DC
  Administered 2020-12-22 – 2020-12-23 (×2): 5 mg via ORAL
  Filled 2020-12-22 (×2): qty 1

## 2020-12-22 MED ORDER — IOHEXOL 350 MG/ML SOLN
INTRAVENOUS | Status: DC | PRN
  Administered 2020-12-22: 10 mL

## 2020-12-22 MED ORDER — PHENYLEPHRINE HCL-NACL 10-0.9 MG/250ML-% IV SOLN
INTRAVENOUS | Status: DC | PRN
  Administered 2020-12-22: 25 ug/min via INTRAVENOUS

## 2020-12-22 MED ORDER — PROPOFOL 10 MG/ML IV BOLUS
INTRAVENOUS | Status: DC | PRN
  Administered 2020-12-22: 120 mg via INTRAVENOUS

## 2020-12-22 MED ORDER — LACTATED RINGERS IV SOLN: INTRAVENOUS | Status: DC

## 2020-12-22 MED ORDER — ESMOLOL HCL 100 MG/10ML IV SOLN
INTRAVENOUS | Status: DC | PRN
  Administered 2020-12-22: 10 mg via INTRAVENOUS
  Administered 2020-12-22: 30 mg via INTRAVENOUS

## 2020-12-22 MED ORDER — SODIUM CHLORIDE 0.9 % IV SOLN: 250.0000 mL | INTRAVENOUS | Status: DC | PRN

## 2020-12-22 MED ORDER — SODIUM CHLORIDE 0.9 % IV SOLN: INTRAVENOUS | Status: DC

## 2020-12-22 MED ORDER — FENTANYL CITRATE (PF) 100 MCG/2ML IJ SOLN
INTRAMUSCULAR | Status: DC | PRN
  Administered 2020-12-22: 50 ug via INTRAVENOUS

## 2020-12-22 MED ORDER — HEPARIN SODIUM (PORCINE) 1000 UNIT/ML IJ SOLN
INTRAMUSCULAR | Status: DC | PRN
  Administered 2020-12-22: 12000 [IU] via INTRAVENOUS

## 2020-12-22 MED ORDER — SUGAMMADEX SODIUM 200 MG/2ML IV SOLN
INTRAVENOUS | Status: DC | PRN
  Administered 2020-12-22: 200 mg via INTRAVENOUS

## 2020-12-22 MED ORDER — CEFAZOLIN SODIUM-DEXTROSE 2-4 GM/100ML-% IV SOLN
2.0000 g | INTRAVENOUS | Status: AC
  Administered 2020-12-22: 2 g via INTRAVENOUS

## 2020-12-22 MED ORDER — LIDOCAINE 2% (20 MG/ML) 5 ML SYRINGE
INTRAMUSCULAR | Status: DC | PRN
  Administered 2020-12-22: 80 mg via INTRAVENOUS

## 2020-12-22 MED ORDER — SODIUM CHLORIDE 0.9% FLUSH
3.0000 mL | Freq: Two times a day (BID) | INTRAVENOUS | Status: DC
  Administered 2020-12-22 – 2020-12-23 (×2): 3 mL via INTRAVENOUS

## 2020-12-22 MED ORDER — TRIAMTERENE-HCTZ 37.5-25 MG PO TABS
0.5000 | ORAL_TABLET | Freq: Every day | ORAL | Status: DC
  Administered 2020-12-23: 0.5 via ORAL
  Filled 2020-12-22: qty 0.5

## 2020-12-22 MED ORDER — SODIUM CHLORIDE 0.9% FLUSH: 3.0000 mL | INTRAVENOUS | Status: DC | PRN

## 2020-12-22 MED ORDER — ASPIRIN EC 81 MG PO TBEC
81.0000 mg | DELAYED_RELEASE_TABLET | Freq: Every day | ORAL | Status: DC
  Administered 2020-12-23: 81 mg via ORAL
  Filled 2020-12-22: qty 1

## 2020-12-22 MED ORDER — CHLORHEXIDINE GLUCONATE 0.12 % MT SOLN
15.0000 mL | Freq: Once | OROMUCOSAL | Status: AC
  Filled 2020-12-22: qty 15

## 2020-12-22 MED ORDER — HEPARIN (PORCINE) IN NACL 1000-0.9 UT/500ML-% IV SOLN
INTRAVENOUS | Status: DC | PRN
  Administered 2020-12-22 (×3): 500 mL

## 2020-12-22 MED ORDER — ROCURONIUM BROMIDE 10 MG/ML (PF) SYRINGE
PREFILLED_SYRINGE | INTRAVENOUS | Status: DC | PRN
  Administered 2020-12-22: 60 mg via INTRAVENOUS

## 2020-12-22 MED ORDER — AMLODIPINE BESYLATE 5 MG PO TABS
5.0000 mg | ORAL_TABLET | Freq: Every day | ORAL | Status: DC
  Administered 2020-12-23: 5 mg via ORAL
  Filled 2020-12-22: qty 1

## 2020-12-22 MED ORDER — DEXAMETHASONE SODIUM PHOSPHATE 10 MG/ML IJ SOLN
INTRAMUSCULAR | Status: DC | PRN
  Administered 2020-12-22: 5 mg via INTRAVENOUS

## 2020-12-22 MED ORDER — HEPARIN (PORCINE) IN NACL 2000-0.9 UNIT/L-% IV SOLN
INTRAVENOUS | Status: DC | PRN
  Administered 2020-12-22: 1000 mL

## 2020-12-22 MED ORDER — PHENYLEPHRINE 40 MCG/ML (10ML) SYRINGE FOR IV PUSH (FOR BLOOD PRESSURE SUPPORT)
PREFILLED_SYRINGE | INTRAVENOUS | Status: DC | PRN
  Administered 2020-12-22: 80 ug via INTRAVENOUS
  Administered 2020-12-22 (×2): 120 ug via INTRAVENOUS

## 2020-12-22 MED ORDER — PROTAMINE SULFATE 10 MG/ML IV SOLN
INTRAVENOUS | Status: DC | PRN
  Administered 2020-12-22: 25 mg via INTRAVENOUS

## 2020-12-22 MED ORDER — METOPROLOL SUCCINATE ER 50 MG PO TB24
50.0000 mg | ORAL_TABLET | Freq: Every day | ORAL | Status: DC
  Administered 2020-12-23: 50 mg via ORAL
  Filled 2020-12-22: qty 1

## 2020-12-22 MED ORDER — ONDANSETRON HCL 4 MG/2ML IJ SOLN
INTRAMUSCULAR | Status: DC | PRN
  Administered 2020-12-22: 4 mg via INTRAVENOUS

## 2020-12-22 MED ORDER — CEFAZOLIN SODIUM-DEXTROSE 2-4 GM/100ML-% IV SOLN
INTRAVENOUS | Status: AC
  Filled 2020-12-22: qty 100

## 2020-12-22 SURGICAL SUPPLY — 20 items
BAG SNAP BAND KOVER 36X36 (MISCELLANEOUS) ×1 IMPLANT
BLANKET WARM UNDERBOD FULL ACC (MISCELLANEOUS) ×1 IMPLANT
CATH DIAG 6FR PIGTAIL ANGLED (CATHETERS) ×1 IMPLANT
CLOSURE PERCLOSE PROSTYLE (VASCULAR PRODUCTS) ×1 IMPLANT
DEVICE WATCHMAN FLX PROC (KITS) IMPLANT
DILATOR VESSEL 38 20CM 12FR (INTRODUCER) ×1 IMPLANT
DILATOR VESSEL 38 20CM 16FR (INTRODUCER) ×1 IMPLANT
KIT HEART LEFT (KITS) ×1 IMPLANT
KIT VERSACROSS LRG ACCESS (CATHETERS) ×1 IMPLANT
MAT PREVALON FULL STRYKER (MISCELLANEOUS) ×1 IMPLANT
PACK CARDIAC CATHETERIZATION (CUSTOM PROCEDURE TRAY) ×1 IMPLANT
SHEATH PERFORMER 16FR 30 (SHEATH) ×1 IMPLANT
SHEATH PINNACLE 8F 10CM (SHEATH) ×1 IMPLANT
SHEATH PROBE COVER 6X72 (BAG) ×1 IMPLANT
SHIELD RADPAD SCOOP 12X17 (MISCELLANEOUS) ×2 IMPLANT
TRANSDUCER W/STOPCOCK (MISCELLANEOUS) ×1 IMPLANT
WATCHMAN FLX 24 (Prosthesis & Implant Heart) ×1 IMPLANT
WATCHMAN FLX PROCEDURE DEVICE (KITS) ×2 IMPLANT
WATCHMAN PROCED TRUSEAL ACCESS (SHEATH) ×1 IMPLANT
WATCHMAN TRUSEAL DOUBLE CURVE (SHEATH) ×1 IMPLANT

## 2020-12-22 NOTE — Transfer of Care (Signed)
Immediate Anesthesia Transfer of Care Note  Patient: Kayla Calhoun  Procedure(s) Performed: LEFT ATRIAL APPENDAGE OCCLUSION TRANSESOPHAGEAL ECHOCARDIOGRAM (TEE)  Patient Location: PACU and Cath Lab  Anesthesia Type:General  Level of Consciousness: drowsy and patient cooperative  Airway & Oxygen Therapy: Patient Spontanous Breathing and Patient connected to face mask oxygen  Post-op Assessment: Report given to RN and Post -op Vital signs reviewed and stable  Post vital signs: Reviewed and stable  Last Vitals:  Vitals Value Taken Time  BP 152/86 12/22/20 1206  Temp    Pulse    Resp 18 12/22/20 1207  SpO2    Vitals shown include unvalidated device data.  Last Pain:  Vitals:   12/22/20 0811  TempSrc:   PainSc: 0-No pain         Complications: No notable events documented.

## 2020-12-22 NOTE — Discharge Instructions (Signed)
WATCHMANT Procedure, Care After  Procedure MD: Dr. Lambert Watchman Clinical Coordinator: Ernest Dick, RN  This sheet gives you information about how to care for yourself after your procedure. Your health care provider may also give you more specific instructions. If you have problems or questions, contact your health care provider.  What can I expect after the procedure? After the procedure, it is common to have: Bruising around your puncture site. Tenderness around your puncture site. Tiredness (fatigue).  Medication instructions It is very important to continue to take your blood thinner as directed by your doctor after the Watchman procedure. Call your procedure doctor's office with question or concerns. If you are on Coumadin (warfarin), you will have your INR checked the week after your procedure, with a goal INR of 2.0 - 3.0. Please follow your medication instructions on your discharge summary. Only take the medications listed on your discharge paperwork.  Follow up You will be seen in 1 month after your procedure in the [facility] You will have another TEE (Transesophageal Echocardiogram) approximately 6 weeks after your procedure mark to check your device You will follow up the MD/APP who performed your procedure 6 months after your procedure The Watchman Clinical Coordinator will check in with you from time to time, including 1 and 2 years after your procedure.    Follow these instructions at home: Puncture site care  Follow instructions from your health care provider about how to take care of your puncture site. Make sure you: If present, leave stitches (sutures), skin glue, or adhesive strips in place.  If a large square bandage is present, this may be removed 24 hours after surgery.  Check your puncture site every day for signs of infection. Check for: Redness, swelling, or pain. Fluid or blood. If your puncture site starts to bleed, lie down on your  back, apply firm pressure to the area, and contact your health care provider. Warmth. Pus or a bad smell. Driving Do not drive yourself home if you received sedation Do not drive for at least 4 days after your procedure or however long your health care provider recommends. (Do not resume driving if you have previously been instructed not to drive for other health reasons.) Do not spend greater than 1 hour at a time in a car for the first 3 days. Stop and take a break with a 5 minute walk at least every hour.  Do not drive or use heavy machinery while taking prescription pain medicine.  Activity Avoid activities that take a lot of effort, including exercise, for at least 7 days after your procedure. For the first 3 days, avoid sitting for longer than one hour at a time.  Avoid alcoholic beverages, signing paperwork, or participating in legal proceedings for 24 hours after receiving sedation Do not lift anything that is heavier than 10 lb (4.5 kg) for one week.  No sexual activity for 1 week.  Return to your normal activities as told by your health care provider. Ask your health care provider what activities are safe for you. General instructions Take over-the-counter and prescription medicines only as told by your health care provider. Do not use any products that contain nicotine or tobacco, such as cigarettes and e-cigarettes. If you need help quitting, ask your health care provider. You may shower after 24 hours, but Do not take baths, swim, or use a hot tub for 1 week.  Do not drink alcohol for 24 hours after your procedure. Keep all follow-up   visits as told by your health care provider. This is important. Dental Work: You will require antibiotics prior to any dental work, including cleanings, for 6 months after your Watchman implantation to help protect you from infection. After 6 months, antibiotics are no longer required. Contact a health care provider if: You have redness, mild  swelling, or pain around your puncture site. You have soreness in your throat or at your puncture site that does not improve after several days You have fluid or blood coming from your puncture site that stops after applying firm pressure to the area. Your puncture site feels warm to the touch. You have pus or a bad smell coming from your puncture site. You have a fever. You have chest pain or discomfort that spreads to your neck, jaw, or arm. You are sweating a lot. You feel nauseous. You have a fast or irregular heartbeat. You have shortness of breath. You are dizzy or light-headed and feel the need to lie down. You have pain or numbness in the arm or leg closest to your puncture site. Get help right away if: Your puncture site suddenly swells. Your puncture site is bleeding and the bleeding does not stop after applying firm pressure to the area. These symptoms may represent a serious problem that is an emergency. Do not wait to see if the symptoms will go away. Get medical help right away. Call your local emergency services (911 in the [place] Do not drive yourself to the hospital. Summary After the procedure, it is normal to have bruising and tenderness at the puncture site in your groin, neck, or forearm. Check your puncture site every day for signs of infection. Get help right away if your puncture site is bleeding and the bleeding does not stop after applying firm pressure to the area. This is a medical emergency.  This information is not intended to replace advice given to you by your health care provider. Make sure you discuss any questions you have with your health care provider.    You have an appointment set up with the At[facility] Fibrillation Clinic.  Multiple studies have shown that being followed by a dedicated atrial fibrillation clinic in addition to the standard care you receive from your other physicians improves health. We believe that enrollment in the atrial fibrillation  clinic will allow us to better care for you.   The phone number to the Atrial Fibrillation Clinic is [phone]. The clinic is staffed Monday through Friday from 8:30am to 5pm.  Parking Directions: The clinic is located in the Heart and Vascular Building connected to [facility]. 1)From [address] turn on to [address] and go to the 3rd entrance  (Heart and Vascular entrance) on the right. 2)Look to the right for Heart &Vascular Parking Garage. 3)A code for the entrance is required, for August is 5544.   4)Take the elevators to the 1st floor. Registration is in the room with the glass walls at the end of the hallway.  If you have any trouble parking or locating the clinic, please don't hesitate to call [phone].  

## 2020-12-22 NOTE — Anesthesia Preprocedure Evaluation (Addendum)
Anesthesia Evaluation  Patient identified by MRN, date of birth, ID band Patient awake    Reviewed: Allergy & Precautions, NPO status , Patient's Chart, lab work & pertinent test results  Airway Mallampati: II  TM Distance: >3 FB Neck ROM: Full    Dental  (+) Edentulous Upper, Edentulous Lower, Dental Advisory Given   Pulmonary neg pulmonary ROS, former smoker,    Pulmonary exam normal breath sounds clear to auscultation       Cardiovascular hypertension, Pt. on home beta blockers and Pt. on medications  Rhythm:Irregular Rate:Normal  Echo 10/2020 1. Left ventricular ejection fraction, by estimation, is 50 to 55%. The left ventricle has low normal function. The left ventricle has no regional wall motion abnormalities. Left ventricular diastolic parameters are indeterminate.  2. Right ventricular systolic function is normal. The right ventricular size is normal. There is moderately elevated pulmonary artery systolic pressure.  3. Left atrial size was severely dilated.  4. Right atrial size was moderately dilated.  5. The mitral valve is normal in structure. Moderate mitral valve regurgitation. No evidence of mitral stenosis.  6. The aortic valve is normal in structure. Aortic valve regurgitation is mild. No aortic stenosis is present.  7. The inferior vena cava is dilated in size with <50% respiratory variability, suggesting right atrial pressure of 15 mmHg.    Neuro/Psych negative neurological ROS     GI/Hepatic negative GI ROS, Neg liver ROS,   Endo/Other  diabetes  Renal/GU negative Renal ROS     Musculoskeletal negative musculoskeletal ROS (+)   Abdominal (+) + obese,   Peds  Hematology negative hematology ROS (+)   Anesthesia Other Findings   Reproductive/Obstetrics                           Anesthesia Physical Anesthesia Plan  ASA: 3  Anesthesia Plan: General   Post-op Pain  Management:    Induction: Intravenous  PONV Risk Score and Plan: 3 and Ondansetron, Dexamethasone and Treatment may vary due to age or medical condition  Airway Management Planned: Oral ETT  Additional Equipment: Arterial line  Intra-op Plan:   Post-operative Plan: Extubation in OR  Informed Consent: I have reviewed the patients History and Physical, chart, labs and discussed the procedure including the risks, benefits and alternatives for the proposed anesthesia with the patient or authorized representative who has indicated his/her understanding and acceptance.     Dental advisory given  Plan Discussed with: CRNA  Anesthesia Plan Comments:        Anesthesia Quick Evaluation

## 2020-12-22 NOTE — Anesthesia Procedure Notes (Signed)
Procedure Name: Intubation Date/Time: 12/22/2020 10:54 AM Performed by: Rosiland Oz, CRNA Pre-anesthesia Checklist: Patient identified, Emergency Drugs available, Suction available, Patient being monitored and Timeout performed Patient Re-evaluated:Patient Re-evaluated prior to induction Oxygen Delivery Method: Circle system utilized Preoxygenation: Pre-oxygenation with 100% oxygen Induction Type: IV induction Ventilation: Mask ventilation without difficulty Laryngoscope Size: Miller and 3 Grade View: Grade I Tube type: Oral Tube size: 7.0 mm Number of attempts: 1 Airway Equipment and Method: Stylet Placement Confirmation: ETT inserted through vocal cords under direct vision, positive ETCO2 and breath sounds checked- equal and bilateral Secured at: 22 cm Tube secured with: Tape Dental Injury: Teeth and Oropharynx as per pre-operative assessment

## 2020-12-22 NOTE — H&P (Signed)
Electrophysiology Office Note:    Date:  09/27/2020    ID:  Maddalena, Linarez 1940/12/21, MRN [id number]   PCP:  Kayla Peng, MD           [facility] Cardiologist:  No primary care provider on file.  [facility] Electrophysiologist:  Kayla Prude, MD    Referring MD: Kayla Bathe, MD    Chief Complaint: Atrial fibrillation   History of Present Illness:     Kayla Calhoun is a 80 y.o. female who presents for an evaluation of atrial fibrillation at the request of Dr. Anne Calhoun. 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  The patient last saw Dr. Anne Calhoun on August 30, 2020.  The patient has been on anticoagulation now for over 6 months and is doing well.       Past Medical History:  Diagnosis Date   Atrial fibrillation (HCC)     Hypertension     Vitamin D deficiency             Past Surgical History:  Procedure Laterality Date   ABDOMINAL HYSTERECTOMY       KIDNEY SURGERY       TONSILLECTOMY          Current Medications: Active Medications      Current Meds  Medication Sig   amLODipine (NORVASC) 5 MG tablet TAKE 1 TABLET BY MOUTH EVERY DAY   Cholecalciferol (VITAMIN D3) 50 MCG (2000 UT) TABS Take by mouth daily.   ELIQUIS 5 MG TABS tablet TAKE 1 TABLET BY MOUTH TWICE A DAY   levobunolol (BETAGAN) 0.5 % ophthalmic solution PLACE 1 DROP INTO BOTH EYES TWICE A DAY   metoprolol succinate (TOPROL-XL) 50 MG 24 hr tablet Take 1 tablet (50 mg total) by mouth daily. Take with or immediately following a meal.   TRAVATAN Z 0.004 % SOLN ophthalmic solution     triamterene-hydrochlorothiazide (MAXZIDE-25) 37.5-25 MG tablet TAKE 1/2 TABLET BY MOUTH EVERY DAY   vitamin C (ASCORBIC ACID) 500 MG tablet Take 500 mg by mouth daily.        Allergies:   Patient has no known allergies.    Social History         Socioeconomic History    Marital status: Widowed      Spouse name: Not on file   Number of children: 1   Years of education: Not on file   Highest education level: Not on file  Occupational History   Occupation: retired  Tobacco Use   Smoking status: Former Smoker      Years: 10.00      Types: Cigarettes   Smokeless tobacco: Never Used   Tobacco comment: been quit 35 years  Vaping Use   Vaping Use: Never used  Substance and Sexual Activity   Alcohol use: Not Currently   Drug use: Never   Sexual activity: Not Currently  Other Topics Concern   Not on file  Social History Narrative   Not on file    Social Determinants of Health       Financial Resource Strain: Low Risk   Difficulty of Paying Living Expenses: Not hard at all  Food Insecurity: No Food Insecurity   Worried About Programme researcher, broadcasting/film/video in the Last Year: Never true   Ran Out of Food in the Last Year: Never true  Transportation Needs: No Transportation Needs  Lack of Transportation (Medical): No   Lack of Transportation (Non-Medical): No  Physical Activity: Inactive   Days of Exercise per Week: 0 days   Minutes of Exercise per Session: 0 min  Stress: No Stress Concern Present   Feeling of Stress : Not at all  Social Connections: Not on file      Family History: The patient's family history includes Hypertension in her brother and sister; Prostate cancer in her father; Stroke in her brother, mother, and sister.   ROS:   Please see the history of present illness.    All other systems reviewed and are negative.   EKGs/Labs/Other Studies Reviewed:     The following studies were reviewed today:   September 21, 2019 echo personally reviewed Left ventricular function normal, 50% Right ventricular function normal Dilated left and right atria Moderate mitral regurgitation Mild to moderate tricuspid valve regurgitation Mild aortic regurgitation   EKG:  The ekg ordered today demonstrates atrial fibrillation   Recent Labs: 03/03/2020:  Hemoglobin 12.1; Platelets 226 05/02/2020: ALT 9; BUN 15; Creatinine, Ser 0.93; Potassium 4.4; Sodium 142  Recent Lipid Panel Labs (Brief)          Component Value Date/Time    CHOL 173 10/28/2019 1039    TRIG 60 10/28/2019 1039    HDL 57 10/28/2019 1039    CHOLHDL 3.0 10/28/2019 1039    LDLCALC 104 (H) 10/28/2019 1039        Physical Exam:    VS:  BP 116/74   Pulse 95   Ht 5' (1.524 m)   Wt 187 lb (84.8 kg)   SpO2 92%   BMI 36.52 kg/m         Wt Readings from Last 3 Encounters:  09/27/20 187 lb (84.8 kg)  08/30/20 186 lb (84.4 kg)  05/02/20 185 lb 9.6 oz (84.2 kg)      GEN:  Well nourished, well developed in no acute distress HEENT: Normal NECK: No JVD; No carotid bruits LYMPHATICS: No lymphadenopathy CARDIAC: irregularly irregular, no murmurs, rubs, gallops RESPIRATORY:  Clear to auscultation without rales, wheezing or rhonchi ABDOMEN: Soft, non-tender, non-distended MUSCULOSKELETAL:  No edema; No deformity SKIN: Warm and dry NEUROLOGIC:  Alert and oriented x 3 PSYCHIATRIC:  Normal affect   ASSESSMENT:     1. Persistent atrial fibrillation (HCC)  2. Subarachnoid bleed (HCC)  3. Primary hypertension  4. Moderate mitral insufficiency     PLAN:    In order of problems listed above:   Persistent atrial fibrillation Patient with history of subarachnoid hemorrhage thought to be secondary to an intracranial aneurysm.  This occurred in 2009.  Since that time she has been doing well.  She has been tolerating Eliquis 5 mg twice daily for the last 6 months without bleeding issues.  She is interested in pursuing a stroke prophylaxis strategy that avoids long-term exposure to anticoagulation.  We discussed options including continuing anticoagulation or using a left atrial appendage occlusion to achieve this goal.  I discussed the watchman procedure in detail during today's visit including the risks, expected recovery time and need for short-term anticoagulation.  Her  daughter is with her today in clinic.  They would like to proceed with scheduling.   For now, continue Eliquis.   I have seen Kayla Calhoun in the office today who is being considered for a Watchman left atrial appendage closure device. I believe they will benefit from this procedure given their history of atrial fibrillation, CHA2DS2-VASc score of 6  and unadjusted ischemic stroke rate of 9.7% per year. Unfortunately, the patient is not felt to be a long term anticoagulation candidate secondary to a history of subarachnoid hemorrhage. The patient's chart has been reviewed and I feel that they would be a candidate for short term oral anticoagulation after Watchman implant.   It is my belief that after undergoing a LAA closure procedure, Kayla Calhoun will not need long term anticoagulation which eliminates anticoagulation side effects and major bleeding risk.   Procedural risks for the Watchman implant have been reviewed with the patient including a 0.5% risk of stroke, <1% risk of perforation and <1% risk of device embolization.   The published clinical data on the safety and effectiveness of WATCHMAN include but are not limited to the following: - Holmes DR, Everlene Farrier, Sick P et al. for the PROTECT AF Investigators. Percutaneous closure of the left atrial appendage versus warfarin therapy for prevention of stroke in patients with atrial fibrillation: a randomised non-inferiority trial. Lancet 2009; 374: 534-42. Everlene Farrier, Doshi SK, Isa Rankin D et al. on behalf of the PROTECT AF Investigators. Percutaneous Left Atrial Appendage Closure for Stroke Prophylaxis in Patients With Atrial Fibrillation 2.3-Year Follow-up of the PROTECT AF (Watchman Left Atrial Appendage System for Embolic Protection in Patients With Atrial Fibrillation) Trial. Circulation 2013; 127:720-729. - Alli O, Doshi S,  Kar S, Reddy VY, Sievert H et al. Quality of Life Assessment in the Randomized PROTECT AF  (Percutaneous Closure of the Left Atrial Appendage Versus Warfarin Therapy for Prevention of Stroke in Patients With Atrial Fibrillation) Trial of Patients at Risk for Stroke With Nonvalvular Atrial Fibrillation. J Am Coll Cardiol 2013; 61:1790-8. Aline August DR, Mia [place], Price M, Whisenant B, Sievert H, Doshi S, Huber K, Reddy V. Prospective randomized evaluation of the Watchman left atrial appendage Device in patients with atrial fibrillation versus long-term warfarin therapy; the PREVAIL trial. Journal of the [employer] of Cardiology, Vol. 4, No. 1, 2014, 1-11. - Kar S, Doshi SK, Sadhu A, Horton R, Osorio J et al. Primary outcome evaluation of a next-generation left atrial appendage closure device: results from the PINNACLE FLX trial. Circulation 2021;143(18)1754-1762.     After today's visit with the patient which was dedicated solely for shared decision making visit regarding LAA closure device, the patient decided to proceed with the LAA appendage closure procedure scheduled to be done in the near future at [facility]. Prior to the procedure, I would like to obtain a gated CT scan of the chest with contrast timed for PV/LA visualization.   Additionally, the patient will need an echocardiogram.     Total time spent with patient today 65 minutes. This includes reviewing records, evaluating the patient and coordinating care.         --------------------------------------------------------------------  I have seen, examined the patient, and reviewed the above assessment and plan.    Ms Barrie presents for LAAO. Risks and procedural details reviewed w/ patient.   Kayla Prude, MD 12/22/2020 10:14 AM

## 2020-12-22 NOTE — Anesthesia Postprocedure Evaluation (Signed)
Anesthesia Post Note  Patient: Kayla Calhoun  Procedure(s) Performed: LEFT ATRIAL APPENDAGE OCCLUSION TRANSESOPHAGEAL ECHOCARDIOGRAM (TEE)     Patient location during evaluation: PACU Anesthesia Type: General Level of consciousness: sedated and patient cooperative Pain management: pain level controlled Vital Signs Assessment: post-procedure vital signs reviewed and stable Respiratory status: spontaneous breathing Cardiovascular status: stable Anesthetic complications: no   No notable events documented.  Last Vitals:  Vitals:   12/22/20 1800 12/22/20 1946  BP: 127/86 112/78  Pulse: 97 (!) 108  Resp:  19  Temp:  36.8 C  SpO2:  96%    Last Pain:  Vitals:   12/22/20 1946  TempSrc: Oral  PainSc:                  Lewie Loron

## 2020-12-22 NOTE — Anesthesia Procedure Notes (Signed)
Arterial Line Insertion Start/End7/12/2020 9:35 AM, 12/22/2020 9:35 AM Performed by: CRNA  Patient location: Pre-op. Preanesthetic checklist: patient identified, IV checked, site marked, risks and benefits discussed, surgical consent, monitors and equipment checked, pre-op evaluation, timeout performed and anesthesia consent Lidocaine 1% used for infiltration Right, radial was placed Catheter size: 20 G Hand hygiene performed , maximum sterile barriers used  and Seldinger technique used Allen's test indicative of satisfactory collateral circulation Attempts: 1 Procedure performed without using ultrasound guided technique. Following insertion, dressing applied and Biopatch. Post procedure assessment: normal  Patient tolerated the procedure well with no immediate complications.

## 2020-12-22 NOTE — Discharge Summary (Signed)
ELECTROPHYSIOLOGY PROCEDURE DISCHARGE SUMMARY    Patient ID: Kayla Calhoun,  MRN: [id number], DOB/AGE: 07/20/1940 80 y.o.  Admit date: 12/22/2020 Discharge date: 12/23/20  Primary Care Physician: Dorothyann Peng, MD  Primary Cardiologist: None  Electrophysiologist: Lanier Prude, MD  Primary Discharge Diagnosis:  Persistent Atrial Fibrillation Poor candidacy for long term anticoagulation due to h/o intracranial hemorrhage CHA2DS2Vasc is 6 on Eliquis  Secondary Discharge Diagnosis:  HTN  Procedures This Admission:  Left atrial appendage occlusive device placement on 12/22/20 by Dr. Lalla Brothers.  This study demonstrated: CONCLUSIONS:  1.Successful implantation of a WATCHMAN left atrial appendage occlusive device    2. TEE demonstrating no LAA thrombus 3. No early apparent complications.   Brief HPI: Kayla Calhoun is a 80 y.o. female with a history of Persistent Atrial Fibrillation. The patient was seen as an outpatient and thought to be a poor candidate for continued anticoagulation due to h/o intracranial hemorrhage. Risks, benefits, and alternatives to left atrial appendage occlusive device placement device were reviewed with the patient who wished to proceed.    Hospital Course:  The patient was admitted and underwent Left Atrial appendage occlusive device placement with details as outlined above.  They were monitored on telemetry overnight which demonstrated AFib 110's-120, reviewed with Dr. Lalla Brothers and cleared for discharge.  Procedure site is without complication on the day of discharge.  The patient was examined by Dr. Lalla Brothers and considered to be stable for discharge.  Wound care and restrictions were reviewed with the patient.  Follow up with Jorja Loa, PA in 1 month and repeat TEE in approximately 45 days (both scheduled) post procedure to ensure proper seal of the device. The patient feels well this morning, no CP, SOB or site discomfort, she will follow up with  Dr. Lalla Brothers in 12 weeks for post ablation follow up.    Discharge anticoagulation will be Eliquis 5mg  BID and ASA 81mg  daily  Physical Exam: Vitals:   12/23/20 0000 12/23/20 0200 12/23/20 0319 12/23/20 0729  BP: 115/72 108/72 104/63 97/73  Pulse:   100   Resp:   16 18  Temp:   97.7 F (36.5 C) 98.3 F (36.8 C)  TempSrc:   Oral Oral  SpO2:   97%   Weight:      Height:        GEN- The patient is well appearing, alert and oriented x 3 today.   HEENT: normocephalic, atraumatic; sclera clear, conjunctiva pink; hearing intact; oropharynx clear; neck supple  Lungs- CTA b/l, normal work of breathing.  No wheezes, rales, rhonchi Heart- irreg-irreg, tachycardic, no murmurs, rubs or gallops  GI- soft, non-tender, non-distended Extremities- no clubbing, cyanosis, or edema; R groin is non tender and without hematoma/bruit MS- no significant deformity or atrophy Skin- warm and dry, no rash or lesion Psych- euthymic mood, full affect Neuro- strength and sensation are intact   Labs:   Lab Results  Component Value Date   WBC 3.9 (L) 12/20/2020   HGB 12.1 12/20/2020   HCT 39.0 12/20/2020   MCV 94.4 12/20/2020   PLT 236 12/20/2020    Recent Labs  Lab 12/23/20 0052  NA 136  K 3.8  CL 101  CO2 24  BUN 18  CREATININE 0.83  CALCIUM 9.4  GLUCOSE 103*     Discharge Medications:  Allergies as of 12/23/2020       Reactions   Contrast Media [iodinated Diagnostic Agents] Rash   Skin peeled (head to heel)  Medication List     STOP taking these medications    predniSONE 50 MG tablet Commonly known as: DELTASONE       TAKE these medications    acetaminophen 500 MG tablet Commonly known as: TYLENOL Take 500 mg by mouth every 6 (six) hours as needed for mild pain or moderate pain.   Alphagan P 0.1 % Soln Generic drug: brimonidine Place 1 drop into both eyes in the morning and at bedtime.   amLODipine 5 MG tablet Commonly known as: NORVASC TAKE 1 TABLET BY  MOUTH EVERY DAY   aspirin EC 81 MG tablet Take 1 tablet (81 mg total) by mouth daily. Swallow whole.   Eliquis 5 MG Tabs tablet Generic drug: apixaban TAKE 1 TABLET BY MOUTH TWICE A DAY   hydrOXYzine 10 MG tablet Commonly known as: ATARAX/VISTARIL TAKE 1 TABLET BY MOUTH TWICE A DAY AS NEEDED FOR ITCHING   levobunolol 0.5 % ophthalmic solution Commonly known as: BETAGAN Place 1 drop into both eyes 2 (two) times daily.   metoprolol succinate 50 MG 24 hr tablet Commonly known as: TOPROL-XL Take 1 tablet (50 mg total) by mouth daily. Take with or immediately following a meal.   NONI JUICE PO Take 60 mLs by mouth in the morning.   Travatan Z 0.004 % Soln ophthalmic solution Generic drug: Travoprost (BAK Free) Place 1 drop into both eyes at bedtime.   triamterene-hydrochlorothiazide 37.5-25 MG tablet Commonly known as: MAXZIDE-25 TAKE 1/2 TABLET BY MOUTH EVERY DAY   vitamin C 1000 MG tablet Take 1,000 mg by mouth every other day. In the morning   Vitamin D3 50 MCG (2000 UT) Tabs Take 2,000 Units by mouth daily.   vitamin E 180 MG (400 UNITS) capsule Take 400 Units by mouth 2 (two) times a week.        Disposition: Home Discharge Instructions     Diet - low sodium heart healthy   Complete by: As directed    Increase activity slowly   Complete by: As directed        Follow-up Information     [facility] Follow up.   Specialty: Cardiology Why: 01/25/21 @ 11:30AM with Shauna Hugh, PA-C Contact information: [address] [id number] mc [address] [place] [address] [phone]                Duration of Discharge Encounter: Greater than 30 minutes including physician time.  Signed, Sheilah Pigeon, PA-C  12/23/2020 9:46 AM

## 2020-12-23 ENCOUNTER — Encounter (HOSPITAL_COMMUNITY): Payer: Self-pay | Admitting: Cardiology

## 2020-12-23 DIAGNOSIS — I4819 Other persistent atrial fibrillation: Principal | ICD-10-CM

## 2020-12-23 LAB — BASIC METABOLIC PANEL
Anion gap: 11 (ref 5–15)
BUN: 18 mg/dL (ref 8–23)
CO2: 24 mmol/L (ref 22–32)
Calcium: 9.4 mg/dL (ref 8.9–10.3)
Chloride: 101 mmol/L (ref 98–111)
Creatinine, Ser: 0.83 mg/dL (ref 0.44–1.00)
GFR, Estimated: >60 mL/min (ref >60)
Glucose, Bld: 103 mg/dL — ABNORMAL HIGH (ref 70–99)
Potassium: 3.8 mmol/L (ref 3.5–5.1)
Sodium: 136 mmol/L (ref 135–145)

## 2020-12-26 ENCOUNTER — Telehealth: Payer: Self-pay

## 2020-12-26 NOTE — Telephone Encounter (Signed)
  [facility]    Patient contacted regarding discharge from [facility] on 12/23/2020  Patient understands to follow up with provider Jorja Loa, PA on 01/25/21 in the [facility]. She understands she will get instructions for 45 day TEE at that visit.  Patient understands discharge instructions? yes Patient understands medications and regimen? yes The patient reports healthy groin site with no pain or signs/symptoms of infection. She understands to call prior to next visit if she has questions or concerns.

## 2020-12-26 NOTE — Telephone Encounter (Signed)
Transition Care Management Follow-up Telephone Call Date of discharge and from where: [facility] 12/23/2020 How have you been since you were released from the hospital? Pt says she feels great.  Any questions or concerns? No  Items Reviewed: Did the pt receive and understand the discharge instructions provided? Yes  Medications obtained and verified? Yes  Other? No  Any new allergies since your discharge? No  Dietary orders reviewed? Yes Do you have support at home? Yes   Home Care and Equipment/Supplies: Were home health services ordered? not applicable If so, what is the name of the agency? N/a   Has the agency set up a time to come to the patient's home? not applicable Were any new equipment or medical supplies ordered?  No What is the name of the medical supply agency? N/a  Were you able to get the supplies/equipment? not applicable Do you have any questions related to the use of the equipment or supplies? No  Functional Questionnaire: (I = Independent and D = Dependent) ADLs: i  Bathing/Dressing- i  Meal Prep- i  Eating- i  Maintaining continence- i  Transferring/Ambulation- i  Managing Meds- i  Follow up appointments reviewed:  PCP Hospital f/u appt confirmed? Yes  Scheduled to see Arnette Felts on 12/29/2020 @ [facility]. Specialist Hospital f/u appt confirmed? No  Scheduled to see n/a on n/a @ n/a. Are transportation arrangements needed? No  If their condition worsens, is the pt aware to call PCP or go to the Emergency Dept.? Yes Was the patient provided with contact information for the PCP's office or ED? Yes Was to pt encouraged to call back with questions or concerns? Yes

## 2020-12-27 ENCOUNTER — Telehealth: Payer: Self-pay

## 2020-12-27 NOTE — Telephone Encounter (Signed)
Error

## 2020-12-29 ENCOUNTER — Telehealth (HOSPITAL_COMMUNITY): Payer: Self-pay | Admitting: *Deleted

## 2020-12-29 ENCOUNTER — Encounter: Payer: Self-pay | Admitting: Nurse Practitioner

## 2020-12-29 ENCOUNTER — Other Ambulatory Visit: Payer: Self-pay

## 2020-12-29 ENCOUNTER — Ambulatory Visit (INDEPENDENT_AMBULATORY_CARE_PROVIDER_SITE_OTHER): Payer: Medicare Other | Admitting: Nurse Practitioner

## 2020-12-29 VITALS — BP 112/70 | HR 86 | Temp 98.1°F | Ht 62.0 in | Wt 181.0 lb

## 2020-12-29 DIAGNOSIS — Z09 Encounter for follow-up examination after completed treatment for conditions other than malignant neoplasm: Secondary | ICD-10-CM

## 2020-12-29 DIAGNOSIS — Z23 Encounter for immunization: Secondary | ICD-10-CM

## 2020-12-29 DIAGNOSIS — I4819 Other persistent atrial fibrillation: Secondary | ICD-10-CM

## 2020-12-29 MED ORDER — SHINGRIX 50 MCG/0.5ML IM SUSR: 0.5000 mL | Freq: Once | INTRAMUSCULAR | 0 refills | Status: AC

## 2020-12-29 NOTE — Progress Notes (Signed)
I,Katawbba Wiggins,acting as a Neurosurgeon for [employer], FNP.,have documented all relevant documentation on the behalf of Arnette Felts, FNP,as directed by  Arnette Felts, FNP while in the presence of Arnette Felts, FNP.   This visit occurred during the SARS-CoV-2 public health emergency.  Safety protocols were in place, including screening questions prior to the visit, additional usage of staff PPE, and extensive cleaning of exam room while observing appropriate contact time as indicated for disinfecting solutions.  Subjective:     Patient ID: Kayla Calhoun , female    DOB: 18-Mar-1941 , 80 y.o.   MRN: [id number]   Chief Complaint  Patient presents with   hospital f/u    HPI  The patient is here today for a hospital f/u.  While on telemetry she had Afib 110's-120's after her procedure of the watchman implant placed, she is to continue eliquis BID and metoprolol in the morning. She was given a report that she did not have any clogged arteries. She is to f/u with Jorja Loa PA in one month and to have a repeat TEE approximately 45 days post procedure to ensure proper seal of the device. She is doing well since being discharged and is to follow up with Dr. Lalla Brothers in 12 weeks for post ablation follow up according to discharge notes.    She worked as a Diplomatic Services operational officer for years. She also worked at [employer]. She also worked for 10 years at [employer]. 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 She has one daughter Steward Drone  She lives alone.  No problems. Continues to drive.   She has worn hearing aids in the past and one she lost and the other she broke.     Past Medical History:  Diagnosis Date   Atrial fibrillation (HCC)    Glaucoma    Hypertension    Vitamin D deficiency      Family History  Problem Relation Age of Onset   Stroke Mother    Prostate cancer Father    Stroke Sister    Hypertension Sister    Hypertension Brother     Stroke Brother      Current Outpatient Medications:    acetaminophen (TYLENOL) 500 MG tablet, Take 500 mg by mouth every 6 (six) hours as needed for mild pain or moderate pain., Disp: , Rfl:    ALPHAGAN P 0.1 % SOLN, Place 1 drop into both eyes in the morning and at bedtime., Disp: , Rfl:    amLODipine (NORVASC) 5 MG tablet, TAKE 1 TABLET BY MOUTH EVERY DAY, Disp: 90 tablet, Rfl: 2   Ascorbic Acid (VITAMIN C) 1000 MG tablet, Take 1,000 mg by mouth every other day. In the morning, Disp: , Rfl:    aspirin EC 81 MG tablet, Take 1 tablet (81 mg total) by mouth daily. Swallow whole., Disp: 30 tablet, Rfl: 2   Cholecalciferol (VITAMIN D3) 50 MCG (2000 UT) TABS, Take 2,000 Units by mouth daily., Disp: , Rfl:    levobunolol (BETAGAN) 0.5 % ophthalmic solution, Place 1 drop into both eyes 2 (two) times daily., Disp: , Rfl:    metoprolol succinate (TOPROL-XL) 50 MG 24 hr tablet, Take 1 tablet (50 mg total) by mouth daily. Take with or immediately following a meal., Disp: 90 tablet, Rfl: 2   Noni, [place]inda citrifolia, (NONI JUICE PO), Take 60 mLs by mouth every other day., Disp: , Rfl:    TRAVATAN Z 0.004 %  SOLN ophthalmic solution, Place 1 drop into both eyes at bedtime., Disp: , Rfl:    triamterene-hydrochlorothiazide (MAXZIDE-25) 37.5-25 MG tablet, TAKE 1/2 TABLET BY MOUTH EVERY DAY, Disp: 90 tablet, Rfl: 1   vitamin E 180 MG (400 UNITS) capsule, Take 400 Units by mouth 2 (two) times a week., Disp: , Rfl:    cetirizine (ZYRTEC) 10 MG tablet, Take 5 mg by mouth daily as needed for allergies., Disp: , Rfl:    clopidogrel (PLAVIX) 75 MG tablet, Take 1 tablet (75 mg total) by mouth daily., Disp: 90 tablet, Rfl: 1   Allergies  Allergen Reactions   Contrast Media [Iodinated Diagnostic Agents] Rash    Skin peeled (head to heel)     Review of Systems  Constitutional: Negative.   Respiratory: Negative.    Cardiovascular: Negative.   Gastrointestinal: Negative.   Psychiatric/Behavioral: Negative.    All  other systems reviewed and are negative.   Today's Vitals   12/29/20 1610  BP: 112/70  Pulse: 86  Temp: 98.1 F (36.7 C)  TempSrc: Oral  Weight: 181 lb (82.1 kg)  Height: 5\' 2"  (1.575 m)  PainSc: 0-No pain   Body mass index is 33.11 kg/m.   Objective:  Physical Exam Vitals reviewed.  Constitutional:      General: She is not in acute distress.    Appearance: Normal appearance.  Cardiovascular:     Rate and Rhythm: Normal rate and regular rhythm.     Pulses: Normal pulses.     Heart sounds: Normal heart sounds. No murmur heard. Pulmonary:     Effort: Pulmonary effort is normal. No respiratory distress.     Breath sounds: Normal breath sounds. No wheezing.  Neurological:     General: No focal deficit present.     Mental Status: She is alert and oriented to person, place, and time.     Cranial Nerves: No cranial nerve deficit.     Motor: No weakness.  Psychiatric:        Mood and Affect: Mood normal.        Thought Content: Thought content normal.        Judgment: Judgment normal.        Assessment And Plan:     1. Persistent atrial fibrillation (HCC) Had ablation and watchman implant placed now on eliquis BID and ASA 81 mg, with metoprolol in the morning, she is to have  repeat TEE approx 45 days post procedure to ensure proper seal of device and follow up with Dr. in 12 weeks.  2. Hospital discharge follow-up TCM Performed. A member of the clinical team spoke with the patient upon dischare. Discharge summary was reviewed in full detail during the visit. Meds reconciled and compared to discharge meds. Medication list is updated and reviewed with the patient.  Greater than 50% face to face time was spent in counseling an coordination of care.  All questions were answered to the satisfaction of the patient.    3. Encounter for immunization - Zoster Vaccine Adjuvanted [facility]) injection; Inject 0.5 mLs into the muscle once for 1 dose.  Dispense: 0.5 mL; Refill: 0     Patient was given opportunity to ask questions. Patient verbalized understanding of the plan and was able to repeat key elements of the plan. All questions were answered to their satisfaction.  [facility], FNP   I, Arnette Felts, FNP, have reviewed all documentation for this visit. The documentation on 02/13/21 for the exam, diagnosis, procedures, and orders are  all accurate and complete.   IF YOU HAVE BEEN REFERRED TO A SPECIALIST, IT MAY TAKE 1-2 WEEKS TO SCHEDULE/PROCESS THE REFERRAL. IF YOU HAVE NOT HEARD FROM US/SPECIALIST IN TWO WEEKS, PLEASE GIVE [place] A CALL AT [phone].   THE PATIENT IS ENCOURAGED TO PRACTICE SOCIAL DISTANCING DUE TO THE COVID-19 PANDEMIC.

## 2020-12-29 NOTE — Telephone Encounter (Signed)
Pt called to report despite premedication for contrast media allergy (skin peeling) she still experienced the reaction with her procedure.  Pt is improved now but wanted to report the reaction still occurred.

## 2021-01-17 DIAGNOSIS — H401112 Primary open-angle glaucoma, right eye, moderate stage: Secondary | ICD-10-CM | POA: Diagnosis not present

## 2021-01-17 DIAGNOSIS — H401123 Primary open-angle glaucoma, left eye, severe stage: Secondary | ICD-10-CM | POA: Diagnosis not present

## 2021-01-20 ENCOUNTER — Telehealth: Payer: Self-pay | Admitting: Cardiology

## 2021-01-20 NOTE — Telephone Encounter (Signed)
   Star with pre admission said, she called the pt about upcoming procedure and the pt said she has no knowledge about it and she already has a procedure last week, Star wants to clarify if pt is scheduled for the procedure on 02/01/21

## 2021-01-20 NOTE — Telephone Encounter (Signed)
Pt was scheduled for TEE on 8/17 by Renee Ursey/Dr Lalla Brothers at time of 12/23/2020 hospital discharge.   Instructions for the upcoming procedure should have been given to pt at that time.  Pt is scheduled in the [facility] with R. Fenton 01/25/2021 at which time her instructions can be reviewed as well. Left message for Star of this information and requested she c/b if any further questions.

## 2021-01-22 NOTE — Addendum Note (Signed)
Encounter addended by: Novella Olive on: 01/22/2021 1:46 PM  Actions taken: Letter saved

## 2021-01-25 ENCOUNTER — Ambulatory Visit (HOSPITAL_COMMUNITY)
Admission: RE | Admit: 2021-01-25 | Discharge: 2021-01-25 | Disposition: A | Discharge status: Home / Self Care | Payer: Medicare Other | Source: Ambulatory Visit | Attending: Physician Assistant | Admitting: Physician Assistant

## 2021-01-25 ENCOUNTER — Other Ambulatory Visit: Payer: Self-pay

## 2021-01-25 ENCOUNTER — Ambulatory Visit (HOSPITAL_COMMUNITY): Payer: Medicare Other | Admitting: Physician Assistant

## 2021-01-25 VITALS — BP 128/78 | HR 88 | Ht 62.0 in | Wt 182.6 lb

## 2021-01-25 DIAGNOSIS — D6869 Other thrombophilia: Secondary | ICD-10-CM | POA: Diagnosis not present

## 2021-01-25 DIAGNOSIS — Z87891 Personal history of nicotine dependence: Secondary | ICD-10-CM | POA: Diagnosis not present

## 2021-01-25 DIAGNOSIS — I4891 Unspecified atrial fibrillation: Secondary | ICD-10-CM | POA: Diagnosis present

## 2021-01-25 DIAGNOSIS — Z79899 Other long term (current) drug therapy: Secondary | ICD-10-CM | POA: Insufficient documentation

## 2021-01-25 DIAGNOSIS — I10 Essential (primary) hypertension: Secondary | ICD-10-CM | POA: Insufficient documentation

## 2021-01-25 DIAGNOSIS — Z7901 Long term (current) use of anticoagulants: Secondary | ICD-10-CM | POA: Diagnosis not present

## 2021-01-25 DIAGNOSIS — I4819 Other persistent atrial fibrillation: Secondary | ICD-10-CM | POA: Diagnosis not present

## 2021-01-25 DIAGNOSIS — Z6833 Body mass index (BMI) 33.0-33.9, adult: Secondary | ICD-10-CM | POA: Insufficient documentation

## 2021-01-25 DIAGNOSIS — E669 Obesity, unspecified: Secondary | ICD-10-CM | POA: Insufficient documentation

## 2021-01-25 DIAGNOSIS — Z7982 Long term (current) use of aspirin: Secondary | ICD-10-CM | POA: Diagnosis not present

## 2021-01-25 LAB — BASIC METABOLIC PANEL
Anion gap: 8 (ref 5–15)
BUN: 15 mg/dL (ref 8–23)
CO2: 28 mmol/L (ref 22–32)
Calcium: 9.3 mg/dL (ref 8.9–10.3)
Chloride: 103 mmol/L (ref 98–111)
Creatinine, Ser: 0.86 mg/dL (ref 0.44–1.00)
GFR, Estimated: >60 mL/min (ref >60)
Glucose, Bld: 89 mg/dL (ref 70–99)
Potassium: 4.1 mmol/L (ref 3.5–5.1)
Sodium: 139 mmol/L (ref 135–145)

## 2021-01-25 LAB — CBC
HCT: 34.7 % — ABNORMAL LOW (ref 36.0–46.0)
Hemoglobin: 10.4 g/dL — ABNORMAL LOW (ref 12.0–15.0)
MCH: 28.8 pg (ref 26.0–34.0)
MCHC: 30 g/dL (ref 30.0–36.0)
MCV: 96.1 fL (ref 80.0–100.0)
Platelets: 264 10*3/uL (ref 150–400)
RBC: 3.61 MIL/uL — ABNORMAL LOW (ref 3.87–5.11)
RDW: 14.3 % (ref 11.5–15.5)
WBC: 4.6 10*3/uL (ref 4.0–10.5)
nRBC: 0 % (ref 0.0–0.2)

## 2021-01-25 NOTE — H&P (View-Only) (Signed)
Primary Care Physician: Glendale Chard, MD Primary Cardiologist: Dr Marlou Porch Primary Electrophysiologist: Dr Quentin Ore Referring Physician: Dr Warren Danes is a 80 y.o. female with a history of HTN, SAH in 2009, and atrial fibrillation who presents for follow up in the [facility]. Patient is on Eliquis for a CHADS2VASC score of 6. Patient was seen by Dr Quentin Ore for [facility] device consideration. She is not felt to be a candidate for long term anticoagulation due to h/o SAH.   On follow up today, she is s/p Watchman implant 12/22/20. Patient reports that despite prednisone and benadryl she still had a reaction to the contrast dye. Otherwise, she feels well with no CP or groin issues. No current bleeding issues on anticoagulation.   Today, she denies symptoms of palpitations, chest pain, shortness of breath, orthopnea, PND, lower extremity edema, dizziness, presyncope, syncope, snoring, daytime somnolence, bleeding, or neurologic sequela. The patient is tolerating medications without difficulties and is otherwise without complaint today.    Atrial Fibrillation Risk Factors:  she does not have symptoms or diagnosis of sleep apnea. she does not have a history of rheumatic fever.   she has a BMI of Body mass index is 33.4 kg/m.Marland Kitchen Filed Weights   01/25/21 1340  Weight: 82.8 kg    Family History  Problem Relation Age of Onset   Stroke Mother    Prostate cancer Father    Stroke Sister    Hypertension Sister    Hypertension Brother    Stroke Brother      Atrial Fibrillation Management history:  Previous antiarrhythmic drugs: none Previous cardioversions: none Previous ablations: none CHADS2VASC score: 6 Anticoagulation history: Eliquis   Past Medical History:  Diagnosis Date   Atrial fibrillation ([place])    Glaucoma    Hypertension    Vitamin D deficiency    Past Surgical History:  Procedure Laterality Date   ABDOMINAL  HYSTERECTOMY     KIDNEY SURGERY     LEFT ATRIAL APPENDAGE OCCLUSION N/A 12/22/2020   Procedure: LEFT ATRIAL APPENDAGE OCCLUSION;  Surgeon: Vickie Epley, MD;  Location: [place] [facility];  Service: Cardiovascular;  Laterality: N/A;   TEE WITHOUT CARDIOVERSION N/A 12/22/2020   Procedure: TRANSESOPHAGEAL ECHOCARDIOGRAM (TEE);  Surgeon: Vickie Epley, MD;  Location: [place] [facility];  Service: Cardiovascular;  Laterality: N/A;   TONSILLECTOMY      Current Outpatient Medications  Medication Sig Dispense Refill   acetaminophen (TYLENOL) 500 MG tablet Take 500 mg by mouth every 6 (six) hours as needed for mild pain or moderate pain.     aspirin EC 81 MG tablet Take 1 tablet (81 mg total) by mouth daily. Swallow whole. 30 tablet 2   Cholecalciferol (VITAMIN D3) 50 MCG (2000 UT) TABS Take 2,000 Units by mouth daily.     ELIQUIS 5 MG TABS tablet TAKE 1 TABLET BY MOUTH TWICE A DAY 60 tablet 2   levobunolol (BETAGAN) 0.5 % ophthalmic solution Place 1 drop into both eyes 2 (two) times daily.     metoprolol succinate (TOPROL-XL) 50 MG 24 hr tablet Take 1 tablet (50 mg total) by mouth daily. Take with or immediately following a meal. 90 tablet 2   Noni, Morinda citrifolia, (NONI JUICE PO) Take 60 mLs by mouth every other day.     TRAVATAN Z 0.004 % SOLN ophthalmic solution Place 1 drop into both eyes at bedtime.     triamterene-hydrochlorothiazide (MAXZIDE-25) 37.5-25 MG tablet TAKE 1/2 TABLET  BY MOUTH EVERY DAY 90 tablet 1   vitamin E 180 MG (400 UNITS) capsule Take 400 Units by mouth 2 (two) times a week.     ALPHAGAN P 0.1 % SOLN Place 1 drop into both eyes in the morning and at bedtime.     amLODipine (NORVASC) 5 MG tablet TAKE 1 TABLET BY MOUTH EVERY DAY 90 tablet 2   Ascorbic Acid (VITAMIN C) 1000 MG tablet Take 1,000 mg by mouth every other day. In the morning     No current facility-administered medications for this encounter.    Allergies  Allergen Reactions   Contrast Media  [Iodinated Diagnostic Agents] Rash    Skin peeled (head to heel)    Social History   Socioeconomic History   Marital status: Widowed    Spouse name: Not on file   Number of children: 1   Years of education: Not on file   Highest education level: Not on file  Occupational History   Occupation: retired  Tobacco Use   Smoking status: Former    Years: 10.00    Types: Cigarettes   Smokeless tobacco: Never   Tobacco comments:    been quit 35 years  Vaping Use   Vaping Use: Never used  Substance and Sexual Activity   Alcohol use: Not Currently   Drug use: Never   Sexual activity: Not Currently  Other Topics Concern   Not on file  Social History Narrative   Not on file   Social Determinants of Health   Financial Resource Strain: Low Risk    Difficulty of Paying Living Expenses: Not hard at all  Food Insecurity: No Food Insecurity   Worried About Running Out of Food in the Last Year: Never true   Ran Out of Food in the Last Year: Never true  Transportation Needs: No Transportation Needs   Lack of Transportation (Medical): No   Lack of Transportation (Non-Medical): No  Physical Activity: Inactive   Days of Exercise per Week: 0 days   Minutes of Exercise per Session: 0 min  Stress: No Stress Concern Present   Feeling of Stress : Not at all  Social Connections: Not on file  Intimate Partner Violence: Not on file     ROS- All systems are reviewed and negative except as per the HPI above.  Physical Exam: Vitals:   01/25/21 1340  BP: 128/78  Pulse: 88  Weight: 82.8 kg  Height: 5' 2" (1.575 m)   GEN- The patient is a well appearing elderly obese female, alert and oriented x 3 today.   HEENT-head normocephalic, atraumatic, sclera clear, conjunctiva pink, hearing intact, trachea midline. Lungs- Clear to ausculation bilaterally, normal work of breathing Heart- irregular rate and rhythm, no murmurs, rubs or gallops  GI- soft, NT, ND, + BS Extremities- no clubbing,  cyanosis, or edema MS- no significant deformity or atrophy Skin- no rash or lesion Psych- euthymic mood, full affect Neuro- strength and sensation are intact   Wt Readings from Last 3 Encounters:  01/25/21 82.8 kg  12/29/20 82.1 kg  12/22/20 83 kg    EKG today demonstrates  Afib Vent. rate 88 BPM PR interval * ms QRS duration 70 ms QT/QTcB 348/421 ms  Echo 10/26/20 demonstrated  1. Left ventricular ejection fraction, by estimation, is 50 to 55%. The  left ventricle has low normal function. The left ventricle has no regional  wall motion abnormalities. Left ventricular diastolic parameters are  indeterminate.   2. Right ventricular systolic function   is normal. The right ventricular  size is normal. There is moderately elevated pulmonary artery systolic  pressure.   3. Left atrial size was severely dilated.   4. Right atrial size was moderately dilated.   5. The mitral valve is normal in structure. Moderate mitral valve  regurgitation. No evidence of mitral stenosis.   6. The aortic valve is normal in structure. Aortic valve regurgitation is  mild. No aortic stenosis is present.   7. The inferior vena cava is dilated in size with <50% respiratory  variability, suggesting right atrial pressure of 15 mmHg.   Comparison(s): No significant change from prior study. Prior images  reviewed side by side.   Cardiac CT 10/13/20 1. 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 2. A 24 mm Watchman FLX device is recommended based on the above landing zone measurements (20.8 mm maximum diameter; 13% compression). 3. There is no thrombus in the left atrial appendage on delayed imaging.  Epic records are reviewed at length today  HAS-BLED score 2 Hypertension No Abnormal renal and liver function (Dialysis, transplant, Cr >2.26 mg/dL /Cirrhosis or Bilirubin >2x Normal or AST/ALT/AP >3x Normal) No  Stroke No  Bleeding Yes  Labile INR (Unstable/high INR) No  Elderly  (>65) Yes  Drugs or alcohol (? 8 drinks/week, anti-plt or NSAID) No   CHA2DS2-VASc Score = 6  The patient's score is based upon: CHF History: No HTN History: Yes Diabetes History: No Stroke History: Yes [facility]) Vascular Disease History: No Age Score: 2 Gender Score: 1     ASSESSMENT AND PLAN: 1. Persistent Atrial Fibrillation (ICD10:  I48.19) The patient's CHA2DS2-VASc score is 6, indicating a 9.7% annual risk of stroke.   S/p Watchman implant with Dr Quentin Ore on 12/22/20 Scheduled for TEE on 02/01/21, if criteria met will stop Eliquis and start Plavix 75 mg daily at 45 days post implant. Continue ASA 81 mg daily.  Continue Eliquis 5 mg BID for now. Check bmet/cbc Continue Toprol 50 mg daily  2. Secondary Hypercoagulable State (ICD10:  D68.69) The patient is at significant risk for stroke/thromboembolism based upon her CHA2DS2-VASc Score of 6.  Continue Apixaban (Eliquis). See plans above.  3. Obesity Body mass index is 33.4 kg/m. Lifestyle modification was discussed and encouraged including regular physical activity and weight reduction.  4. HTN Stable, no changes today.   Follow up with Dr Marlou Porch as scheduled. Dr Quentin Ore at 6 months post implant.    [facility] [address] [place], [place] [address] [phone] 01/25/2021 2:30 PM

## 2021-01-25 NOTE — Addendum Note (Signed)
Encounter addended by: Danice Goltz, PA on: 01/25/2021 5:05 PM  Actions taken: Clinical Note Signed

## 2021-01-25 NOTE — Patient Instructions (Signed)
TEE scheduled for Wednesday, August 17th  - Arrive at the [facility] and go to admitting at [facility] not eat or drink anything after midnight the night prior to your procedure.  - Take all your morning medication (except diabetic medications) with a sip of water prior to arrival.  - You will not be able to drive home after your procedure.  Follow up with Dr. Lalla Brothers in 6 months.

## 2021-01-25 NOTE — Progress Notes (Addendum)
Primary Care Physician: Glendale Chard, MD Primary Cardiologist: Dr Marlou Porch Primary Electrophysiologist: Dr Quentin Ore Referring Physician: Dr Warren Danes is a 80 y.o. female with a history of HTN, SAH in 2009, and atrial fibrillation who presents for follow up in the [facility]. Patient is on Eliquis for a CHADS2VASC score of 6. Patient was seen by Dr Quentin Ore for [facility] device consideration. She is not felt to be a candidate for long term anticoagulation due to h/o SAH.   On follow up today, she is s/p Watchman implant 12/22/20. Patient reports that despite prednisone and benadryl she still had a reaction to the contrast dye. Otherwise, she feels well with no CP or groin issues. No current bleeding issues on anticoagulation.   Today, she denies symptoms of palpitations, chest pain, shortness of breath, orthopnea, PND, lower extremity edema, dizziness, presyncope, syncope, snoring, daytime somnolence, bleeding, or neurologic sequela. The patient is tolerating medications without difficulties and is otherwise without complaint today.    Atrial Fibrillation Risk Factors:  she does not have symptoms or diagnosis of sleep apnea. she does not have a history of rheumatic fever.   she has a BMI of Body mass index is 33.4 kg/m.Marland Kitchen Filed Weights   01/25/21 1340  Weight: 82.8 kg    Family History  Problem Relation Age of Onset   Stroke Mother    Prostate cancer Father    Stroke Sister    Hypertension Sister    Hypertension Brother    Stroke Brother      Atrial Fibrillation Management history:  Previous antiarrhythmic drugs: none Previous cardioversions: none Previous ablations: none CHADS2VASC score: 6 Anticoagulation history: Eliquis   Past Medical History:  Diagnosis Date   Atrial fibrillation ([place])    Glaucoma    Hypertension    Vitamin D deficiency    Past Surgical History:  Procedure Laterality Date   ABDOMINAL  HYSTERECTOMY     KIDNEY SURGERY     LEFT ATRIAL APPENDAGE OCCLUSION N/A 12/22/2020   Procedure: LEFT ATRIAL APPENDAGE OCCLUSION;  Surgeon: Vickie Epley, MD;  Location: [place] [facility];  Service: Cardiovascular;  Laterality: N/A;   TEE WITHOUT CARDIOVERSION N/A 12/22/2020   Procedure: TRANSESOPHAGEAL ECHOCARDIOGRAM (TEE);  Surgeon: Vickie Epley, MD;  Location: [place] [facility];  Service: Cardiovascular;  Laterality: N/A;   TONSILLECTOMY      Current Outpatient Medications  Medication Sig Dispense Refill   acetaminophen (TYLENOL) 500 MG tablet Take 500 mg by mouth every 6 (six) hours as needed for mild pain or moderate pain.     aspirin EC 81 MG tablet Take 1 tablet (81 mg total) by mouth daily. Swallow whole. 30 tablet 2   Cholecalciferol (VITAMIN D3) 50 MCG (2000 UT) TABS Take 2,000 Units by mouth daily.     ELIQUIS 5 MG TABS tablet TAKE 1 TABLET BY MOUTH TWICE A DAY 60 tablet 2   levobunolol (BETAGAN) 0.5 % ophthalmic solution Place 1 drop into both eyes 2 (two) times daily.     metoprolol succinate (TOPROL-XL) 50 MG 24 hr tablet Take 1 tablet (50 mg total) by mouth daily. Take with or immediately following a meal. 90 tablet 2   Noni, Morinda citrifolia, (NONI JUICE PO) Take 60 mLs by mouth every other day.     TRAVATAN Z 0.004 % SOLN ophthalmic solution Place 1 drop into both eyes at bedtime.     triamterene-hydrochlorothiazide (MAXZIDE-25) 37.5-25 MG tablet TAKE 1/2 TABLET  BY MOUTH EVERY DAY 90 tablet 1   vitamin E 180 MG (400 UNITS) capsule Take 400 Units by mouth 2 (two) times a week.     ALPHAGAN P 0.1 % SOLN Place 1 drop into both eyes in the morning and at bedtime.     amLODipine (NORVASC) 5 MG tablet TAKE 1 TABLET BY MOUTH EVERY DAY 90 tablet 2   Ascorbic Acid (VITAMIN C) 1000 MG tablet Take 1,000 mg by mouth every other day. In the morning     No current facility-administered medications for this encounter.    Allergies  Allergen Reactions   Contrast Media  [Iodinated Diagnostic Agents] Rash    Skin peeled (head to heel)    Social History   Socioeconomic History   Marital status: Widowed    Spouse name: Not on file   Number of children: 1   Years of education: Not on file   Highest education level: Not on file  Occupational History   Occupation: retired  Tobacco Use   Smoking status: Former    Years: 10.00    Types: Cigarettes   Smokeless tobacco: Never   Tobacco comments:    been quit 35 years  Vaping Use   Vaping Use: Never used  Substance and Sexual Activity   Alcohol use: Not Currently   Drug use: Never   Sexual activity: Not Currently  Other Topics Concern   Not on file  Social History Narrative   Not on file   Social Determinants of Health   Financial Resource Strain: Low Risk    Difficulty of Paying Living Expenses: Not hard at all  Food Insecurity: No Food Insecurity   Worried About Charity fundraiser in the Last Year: Never true   [place] in the Last Year: Never true  Transportation Needs: No Transportation Needs   Lack of Transportation (Medical): No   Lack of Transportation (Non-Medical): No  Physical Activity: Inactive   Days of Exercise per Week: 0 days   Minutes of Exercise per Session: 0 min  Stress: No Stress Concern Present   Feeling of Stress : Not at all  Social Connections: Not on file  Intimate Partner Violence: Not on file     ROS- All systems are reviewed and negative except as per the HPI above.  Physical Exam: Vitals:   01/25/21 1340  BP: 128/78  Pulse: 88  Weight: 82.8 kg  Height: _0  (1.575 m)   GEN- The patient is a well appearing elderly obese female, alert and oriented x 3 today.   HEENT-head normocephalic, atraumatic, sclera clear, conjunctiva pink, hearing intact, trachea midline. Lungs- Clear to ausculation bilaterally, normal work of breathing Heart- irregular rate and rhythm, no murmurs, rubs or gallops  GI- soft, NT, ND, + BS Extremities- no clubbing,  cyanosis, or edema MS- no significant deformity or atrophy Skin- no rash or lesion Psych- euthymic mood, full affect Neuro- strength and sensation are intact   Wt Readings from Last 3 Encounters:  01/25/21 82.8 kg  12/29/20 82.1 kg  12/22/20 83 kg    EKG today demonstrates  Afib Vent. rate 88 BPM PR interval * ms QRS duration 70 ms QT/QTcB 348/421 ms  Echo 10/26/20 demonstrated  1. Left ventricular ejection fraction, by estimation, is 50 to 55%. The  left ventricle has low normal function. The left ventricle has no regional  wall motion abnormalities. Left ventricular diastolic parameters are  indeterminate.   2. Right ventricular systolic function  is normal. The right ventricular  size is normal. There is moderately elevated pulmonary artery systolic  pressure.   3. Left atrial size was severely dilated.   4. Right atrial size was moderately dilated.   5. The mitral valve is normal in structure. Moderate mitral valve  regurgitation. No evidence of mitral stenosis.   6. The aortic valve is normal in structure. Aortic valve regurgitation is  mild. No aortic stenosis is present.   7. The inferior vena cava is dilated in size with <50% respiratory  variability, suggesting right atrial pressure of 15 mmHg.   Comparison(s): No significant change from prior study. Prior images  reviewed side by side.   Cardiac CT 10/13/20 1. 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 2. A 24 mm Watchman FLX device is recommended based on the above landing zone measurements (20.8 mm maximum diameter; 13% compression). 3. There is no thrombus in the left atrial appendage on delayed imaging.  Epic records are reviewed at length today  HAS-BLED score 2 Hypertension No Abnormal renal and liver function (Dialysis, transplant, Cr >2.26 mg/dL /Cirrhosis or Bilirubin >2x Normal or AST/ALT/AP >3x Normal) No  Stroke No  Bleeding Yes  Labile INR (Unstable/high INR) No  Elderly  (>65) Yes  Drugs or alcohol (? 8 drinks/week, anti-plt or NSAID) No   CHA2DS2-VASc Score = 6  The patient's score is based upon: CHF History: No HTN History: Yes Diabetes History: No Stroke History: Yes [facility]) Vascular Disease History: No Age Score: 2 Gender Score: 1     ASSESSMENT AND PLAN: 1. Persistent Atrial Fibrillation (ICD10:  I48.19) The patient's CHA2DS2-VASc score is 6, indicating a 9.7% annual risk of stroke.   S/p Watchman implant with Dr Quentin Ore on 12/22/20 Scheduled for TEE on 02/01/21, if criteria met will stop Eliquis and start Plavix 75 mg daily at 45 days post implant. Continue ASA 81 mg daily.  Continue Eliquis 5 mg BID for now. Check bmet/cbc Continue Toprol 50 mg daily  2. Secondary Hypercoagulable State (ICD10:  D68.69) The patient is at significant risk for stroke/thromboembolism based upon her CHA2DS2-VASc Score of 6.  Continue Apixaban (Eliquis). See plans above.  3. Obesity Body mass index is 33.4 kg/m. Lifestyle modification was discussed and encouraged including regular physical activity and weight reduction.  4. HTN Stable, no changes today.   Follow up with Dr Marlou Porch as scheduled. Dr Quentin Ore at 6 months post implant.    [facility] [address] [place], [place] [address] [phone] 01/25/2021 2:30 PM

## 2021-01-26 ENCOUNTER — Telehealth: Payer: Self-pay

## 2021-01-26 NOTE — Telephone Encounter (Signed)
Watchman implant date: 12/22/2020  Called to arrange 6 month post-implant visit with Dr. Lalla Brothers. Left message to call back.

## 2021-01-27 NOTE — Telephone Encounter (Signed)
Attempted to call the patient again. Left message she will be scheduled for 6 mo visit in January 2023 and she will get that appointment information at her upcoming visit with Dr. Anne Fu. Instructed her to call with any questions or concerns.  Scheduled her 06/27/2021 at 11:45AM with Dr. Lalla Brothers.

## 2021-02-01 ENCOUNTER — Encounter (HOSPITAL_COMMUNITY): Payer: Self-pay | Admitting: Cardiovascular Disease

## 2021-02-01 ENCOUNTER — Ambulatory Visit (HOSPITAL_COMMUNITY): Payer: Medicare Other | Admitting: Anesthesiology

## 2021-02-01 ENCOUNTER — Ambulatory Visit (HOSPITAL_COMMUNITY)
Admission: RE | Admit: 2021-02-01 | Discharge: 2021-02-01 | Disposition: A | Discharge status: Home / Self Care | Payer: Medicare Other | Attending: Cardiovascular Disease | Admitting: Cardiovascular Disease

## 2021-02-01 ENCOUNTER — Ambulatory Visit (HOSPITAL_BASED_OUTPATIENT_CLINIC_OR_DEPARTMENT_OTHER): Payer: Medicare Other

## 2021-02-01 ENCOUNTER — Encounter (HOSPITAL_COMMUNITY)
Admission: RE | Disposition: A | Discharge status: Home / Self Care | Payer: Self-pay | Source: Home / Self Care | Attending: Cardiovascular Disease

## 2021-02-01 ENCOUNTER — Other Ambulatory Visit: Payer: Self-pay

## 2021-02-01 DIAGNOSIS — I4891 Unspecified atrial fibrillation: Secondary | ICD-10-CM | POA: Insufficient documentation

## 2021-02-01 DIAGNOSIS — Z7982 Long term (current) use of aspirin: Secondary | ICD-10-CM | POA: Diagnosis not present

## 2021-02-01 DIAGNOSIS — Z6833 Body mass index (BMI) 33.0-33.9, adult: Secondary | ICD-10-CM | POA: Diagnosis not present

## 2021-02-01 DIAGNOSIS — Z79899 Other long term (current) drug therapy: Secondary | ICD-10-CM | POA: Diagnosis not present

## 2021-02-01 DIAGNOSIS — I083 Combined rheumatic disorders of mitral, aortic and tricuspid valves: Secondary | ICD-10-CM | POA: Diagnosis not present

## 2021-02-01 DIAGNOSIS — Z95818 Presence of other cardiac implants and grafts: Secondary | ICD-10-CM

## 2021-02-01 DIAGNOSIS — I700 Atherosclerosis of aorta: Secondary | ICD-10-CM | POA: Insufficient documentation

## 2021-02-01 DIAGNOSIS — E669 Obesity, unspecified: Secondary | ICD-10-CM | POA: Diagnosis not present

## 2021-02-01 DIAGNOSIS — I4819 Other persistent atrial fibrillation: Secondary | ICD-10-CM | POA: Diagnosis not present

## 2021-02-01 DIAGNOSIS — Z7901 Long term (current) use of anticoagulants: Secondary | ICD-10-CM | POA: Insufficient documentation

## 2021-02-01 DIAGNOSIS — I10 Essential (primary) hypertension: Secondary | ICD-10-CM | POA: Insufficient documentation

## 2021-02-01 DIAGNOSIS — Z87891 Personal history of nicotine dependence: Secondary | ICD-10-CM | POA: Insufficient documentation

## 2021-02-01 DIAGNOSIS — I361 Nonrheumatic tricuspid (valve) insufficiency: Secondary | ICD-10-CM | POA: Diagnosis not present

## 2021-02-01 DIAGNOSIS — Z91041 Radiographic dye allergy status: Secondary | ICD-10-CM | POA: Diagnosis not present

## 2021-02-01 DIAGNOSIS — D6869 Other thrombophilia: Secondary | ICD-10-CM | POA: Diagnosis not present

## 2021-02-01 DIAGNOSIS — I340 Nonrheumatic mitral (valve) insufficiency: Secondary | ICD-10-CM | POA: Diagnosis not present

## 2021-02-01 DIAGNOSIS — E559 Vitamin D deficiency, unspecified: Secondary | ICD-10-CM | POA: Diagnosis not present

## 2021-02-01 HISTORY — PX: TEE WITHOUT CARDIOVERSION: SHX5443

## 2021-02-01 SURGERY — ECHOCARDIOGRAM, TRANSESOPHAGEAL
Anesthesia: Monitor Anesthesia Care

## 2021-02-01 MED ORDER — PROPOFOL 10 MG/ML IV BOLUS
INTRAVENOUS | Status: DC | PRN
  Administered 2021-02-01: 20 mg via INTRAVENOUS
  Administered 2021-02-01: 35 mg via INTRAVENOUS

## 2021-02-01 MED ORDER — SODIUM CHLORIDE 0.9 % IV SOLN: INTRAVENOUS | Status: DC

## 2021-02-01 MED ORDER — LIDOCAINE 2% (20 MG/ML) 5 ML SYRINGE
INTRAMUSCULAR | Status: DC | PRN
  Administered 2021-02-01: 40 mg via INTRAVENOUS

## 2021-02-01 MED ORDER — PROPOFOL 500 MG/50ML IV EMUL
INTRAVENOUS | Status: DC | PRN
  Administered 2021-02-01: 100 ug/kg/min via INTRAVENOUS

## 2021-02-01 MED ORDER — PHENYLEPHRINE 40 MCG/ML (10ML) SYRINGE FOR IV PUSH (FOR BLOOD PRESSURE SUPPORT)
PREFILLED_SYRINGE | INTRAVENOUS | Status: DC | PRN
  Administered 2021-02-01 (×4): 80 ug via INTRAVENOUS

## 2021-02-01 NOTE — Op Note (Signed)
INDICATIONS: Watchman left atrial appendage occluder , 6-week evaluation  PROCEDURE:   Informed consent was obtained prior to the procedure. The risks, benefits and alternatives for the procedure were discussed and the patient comprehended these risks.  Risks include, but are not limited to, cough, sore throat, vomiting, nausea, somnolence, esophageal and stomach trauma or perforation, bleeding, low blood pressure, aspiration, pneumonia, infection, trauma to the teeth and death.    After a procedural time-out, the oropharynx was anesthetized with 20% benzocaine spray.   During this procedure the patient was administered IV propofol by Anesthesiology, Dr. Andres Ege.  The transesophageal probe was inserted in the esophagus and stomach without difficulty and multiple views were obtained.  The patient was kept under observation until the patient left the procedure room.  The patient left the procedure room in stable condition.   Agitated microbubble saline contrast was not administered.  COMPLICATIONS:    There were no immediate complications.  FINDINGS:  Well seated Watchman device without thrombus or leak. Moderate central MR.  RECOMMENDATIONS:     DC antithrombotic meds per protocol.  Time Spent Directly with the Patient:  30 minutes   Kayla Calhoun 02/01/2021, 11:24 AM

## 2021-02-01 NOTE — Progress Notes (Signed)
  Echocardiogram Echocardiogram Transesophageal has been performed.  Kayla Calhoun 02/01/2021, 11:27 AM

## 2021-02-01 NOTE — Anesthesia Procedure Notes (Signed)
Procedure Name: MAC Date/Time: 02/01/2021 10:50 AM Performed by: Renato Shin, CRNA Pre-anesthesia Checklist: Patient identified, Emergency Drugs available, Patient being monitored and Suction available Patient Re-evaluated:Patient Re-evaluated prior to induction Oxygen Delivery Method: Nasal cannula Preoxygenation: Pre-oxygenation with 100% oxygen Induction Type: IV induction Airway Equipment and Method: Bite block Placement Confirmation: positive ETCO2 and breath sounds checked- equal and bilateral Dental Injury: Teeth and Oropharynx as per pre-operative assessment

## 2021-02-01 NOTE — Interval H&P Note (Signed)
History and Physical Interval Note:  02/01/2021 10:01 AM  Kayla Calhoun  has presented today for surgery, with the diagnosis of POST WATCHMAN.  The various methods of treatment have been discussed with the patient and family. After consideration of risks, benefits and other options for treatment, the patient has consented to  Procedure(s): TRANSESOPHAGEAL ECHOCARDIOGRAM (TEE) (N/A) as a surgical intervention.  The patient's history has been reviewed, patient examined, no change in status, stable for surgery.  I have reviewed the patient's chart and labs.  Questions were answered to the patient's satisfaction.     Lyndsi Altic

## 2021-02-01 NOTE — Discharge Instructions (Signed)

## 2021-02-01 NOTE — Anesthesia Preprocedure Evaluation (Addendum)
Anesthesia Evaluation  Patient identified by MRN, date of birth, ID band Patient awake    Reviewed: Allergy & Precautions, NPO status , Patient's Chart, lab work & pertinent test results  Airway Mallampati: II  TM Distance: >3 FB Neck ROM: Full    Dental  (+) Edentulous Upper, Edentulous Lower, Dental Advisory Given   Pulmonary neg pulmonary ROS, former smoker,    Pulmonary exam normal breath sounds clear to auscultation       Cardiovascular hypertension, Pt. on home beta blockers and Pt. on medications + dysrhythmias Atrial Fibrillation  Rhythm:Irregular Rate:Normal  Echo 10/2020 1. Left ventricular ejection fraction, by estimation, is 50 to 55%. The left ventricle has low normal function. The left ventricle has no regional wall motion abnormalities. Left ventricular diastolic parameters are indeterminate.  2. Right ventricular systolic function is normal. The right ventricular size is normal. There is moderately elevated pulmonary artery systolic pressure.  3. Left atrial size was severely dilated.  4. Right atrial size was moderately dilated.  5. The mitral valve is normal in structure. Moderate mitral valve regurgitation. No evidence of mitral stenosis.  6. The aortic valve is normal in structure. Aortic valve regurgitation is mild. No aortic stenosis is present.  7. The inferior vena cava is dilated in size with <50% respiratory variability, suggesting right atrial pressure of 15 mmHg.    Neuro/Psych negative neurological ROS     GI/Hepatic negative GI ROS, Neg liver ROS,   Endo/Other  diabetes  Renal/GU negative Renal ROS     Musculoskeletal negative musculoskeletal ROS (+)   Abdominal (+) + obese,   Peds  Hematology negative hematology ROS (+)   Anesthesia Other Findings   Reproductive/Obstetrics                             Anesthesia Physical  Anesthesia Plan  ASA:  3  Anesthesia Plan: MAC   Post-op Pain Management:    Induction: Intravenous  PONV Risk Score and Plan: 3 and Ondansetron, Dexamethasone and Treatment may vary due to age or medical condition  Airway Management Planned: Natural Airway and Nasal Cannula  Additional Equipment: None  Intra-op Plan:   Post-operative Plan:   Informed Consent: I have reviewed the patients History and Physical, chart, labs and discussed the procedure including the risks, benefits and alternatives for the proposed anesthesia with the patient or authorized representative who has indicated his/her understanding and acceptance.     Dental advisory given  Plan Discussed with: CRNA and Anesthesiologist  Anesthesia Plan Comments:        Anesthesia Quick Evaluation

## 2021-02-01 NOTE — Anesthesia Postprocedure Evaluation (Signed)
Anesthesia Post Note  Patient: Kayla Calhoun  Procedure(s) Performed: TRANSESOPHAGEAL ECHOCARDIOGRAM (TEE)     Patient location during evaluation: PACU Anesthesia Type: MAC Level of consciousness: awake and alert Pain management: pain level controlled Vital Signs Assessment: post-procedure vital signs reviewed and stable Respiratory status: spontaneous breathing, nonlabored ventilation, respiratory function stable and patient connected to nasal cannula oxygen Cardiovascular status: stable and blood pressure returned to baseline Postop Assessment: no apparent nausea or vomiting Anesthetic complications: no   No notable events documented.  Last Vitals:  Vitals:   02/01/21 1118 02/01/21 1128  BP: 110/63 117/71  Pulse: 97   Resp: 19 20  Temp:    SpO2: 95%     Last Pain:  Vitals:   02/01/21 1108  TempSrc: Temporal  PainSc: Asleep                 Javid Kemler

## 2021-02-01 NOTE — Transfer of Care (Signed)
Immediate Anesthesia Transfer of Care Note  Patient: Kayla Calhoun  Procedure(s) Performed: TRANSESOPHAGEAL ECHOCARDIOGRAM (TEE)  Patient Location: PACU and Endoscopy Unit  Anesthesia Type:MAC  Level of Consciousness: drowsy and patient cooperative  Airway & Oxygen Therapy: Patient Spontanous Breathing and Patient connected to face mask oxygen  Post-op Assessment: Report given to RN and Post -op Vital signs reviewed and stable  Post vital signs: Reviewed and stable  Last Vitals:  Vitals Value Taken Time  BP 110/75 02/01/21 1109  Temp 36.2 C 02/01/21 1108  Pulse 84 02/01/21 1112  Resp 23 02/01/21 1112  SpO2 99 % 02/01/21 1112  Vitals shown include unvalidated device data.  Last Pain:  Vitals:   02/01/21 1108  TempSrc: Temporal  PainSc: Asleep         Complications: No notable events documented.

## 2021-02-02 ENCOUNTER — Encounter (HOSPITAL_COMMUNITY): Payer: Self-pay | Admitting: Cardiovascular Disease

## 2021-02-03 ENCOUNTER — Telehealth: Payer: Self-pay

## 2021-02-03 DIAGNOSIS — I4819 Other persistent atrial fibrillation: Secondary | ICD-10-CM

## 2021-02-03 MED ORDER — CLOPIDOGREL BISULFATE 75 MG PO TABS: 75.0000 mg | ORAL_TABLET | Freq: Every day | ORAL | 1 refills | Status: DC

## 2021-02-03 NOTE — Telephone Encounter (Signed)
-----   Message from Lanier Prude, MD sent at 02/03/2021  9:58 AM EDT ----- Reviewed.  Recommend transitioning to Plavix 75mg  by mouth once daily on 02/05/2021. Recommend we check a CBC on 03/08/2021.   Thanks,  03/10/2021 T. Sheria Lang, MD, [facility], [facility] Cardiac Electrophysiology      ----- Message ----- From: [facility], Danice Goltz Sent: 01/27/2021  12:34 PM EDT To: 03/29/2021, RN, Henrietta Dine, MD  Hgb down from 12.1 to 10.4 post Watchman implant on 7/7. She is scheduled for TEE on 8/17 to evaluate device and potentially stopping Eliquis 8/21. She denied any bleeding issues at her office visit. Will forward results to Dr 9/21 for recommendations.

## 2021-02-03 NOTE — Telephone Encounter (Signed)
Reviewed with patient.  She will STOP ELIQUIS after last dose 8/20 and START PLAVIX 75 mg daily 8/21. She has an appointment with Dr. Anne Fu 9/16 and will have CBC repeated at that time. The patient was grateful for call and agrees with plan.

## 2021-02-13 ENCOUNTER — Encounter: Payer: Self-pay | Admitting: Nurse Practitioner

## 2021-03-02 DIAGNOSIS — H903 Sensorineural hearing loss, bilateral: Secondary | ICD-10-CM | POA: Diagnosis not present

## 2021-03-03 ENCOUNTER — Other Ambulatory Visit: Payer: Medicare Other | Admitting: *Deleted

## 2021-03-03 ENCOUNTER — Other Ambulatory Visit: Payer: Self-pay

## 2021-03-03 ENCOUNTER — Ambulatory Visit (INDEPENDENT_AMBULATORY_CARE_PROVIDER_SITE_OTHER): Payer: Medicare Other | Admitting: Cardiology

## 2021-03-03 VITALS — BP 120/70 | HR 98 | Ht 62.0 in | Wt 182.0 lb

## 2021-03-03 DIAGNOSIS — I340 Nonrheumatic mitral (valve) insufficiency: Secondary | ICD-10-CM | POA: Diagnosis not present

## 2021-03-03 DIAGNOSIS — Z8679 Personal history of other diseases of the circulatory system: Secondary | ICD-10-CM | POA: Diagnosis not present

## 2021-03-03 DIAGNOSIS — I4821 Permanent atrial fibrillation: Secondary | ICD-10-CM

## 2021-03-03 DIAGNOSIS — I10 Essential (primary) hypertension: Secondary | ICD-10-CM

## 2021-03-03 DIAGNOSIS — Z95818 Presence of other cardiac implants and grafts: Secondary | ICD-10-CM

## 2021-03-03 DIAGNOSIS — I4819 Other persistent atrial fibrillation: Secondary | ICD-10-CM

## 2021-03-03 LAB — CBC WITH DIFFERENTIAL/PLATELET
Basophils Absolute: 0 10*3/uL (ref 0.0–0.2)
Basos: 0 %
EOS (ABSOLUTE): 0.3 10*3/uL (ref 0.0–0.4)
Eos: 5 %
Hematocrit: 35.6 % (ref 34.0–46.6)
Hemoglobin: 11.6 g/dL (ref 11.1–15.9)
Immature Grans (Abs): 0 10*3/uL (ref 0.0–0.1)
Immature Granulocytes: 0 %
Lymphocytes Absolute: 1.4 10*3/uL (ref 0.7–3.1)
Lymphs: 27 %
MCH: 28.3 pg (ref 26.6–33.0)
MCHC: 32.6 g/dL (ref 31.5–35.7)
MCV: 87 fL (ref 79–97)
Monocytes Absolute: 0.5 10*3/uL (ref 0.1–0.9)
Monocytes: 11 %
Neutrophils Absolute: 2.9 10*3/uL (ref 1.4–7.0)
Neutrophils: 57 %
Platelets: 264 10*3/uL (ref 150–450)
RBC: 4.1 x10E6/uL (ref 3.77–5.28)
RDW: 12.2 % (ref 11.7–15.4)
WBC: 5.1 10*3/uL (ref 3.4–10.8)

## 2021-03-03 NOTE — Assessment & Plan Note (Signed)
Doing well post watchman device July 2022.,  Dr. Lalla Brothers.  Doing well no bleeding no strokelike symptoms.  She is off of her Eliquis.  She is still taking dual antiplatelet therapy with Plavix and aspirin.  In January when she sees Dr. Lalla Brothers again, she may be able to come off of the Plavix after 6 months of therapy.  Postop TEE reviewed, excellent. For atrial fibrillation currently is well rate controlled.

## 2021-03-03 NOTE — Assessment & Plan Note (Signed)
As described above, Dr. Lalla Brothers.  July 2022.  Excellent results.  Postop the reassuring.

## 2021-03-03 NOTE — Assessment & Plan Note (Signed)
Well-controlled.  120/70.  Continue with Maxide, amlodipine 5 mg daily.  No changes in medical management.

## 2021-03-03 NOTE — Patient Instructions (Signed)
Medication Instructions:  The current medical regimen is effective;  continue present plan and medications.  *If you need a refill on your cardiac medications before your next appointment, please call your pharmacy*  Your physician has requested that you have an echocardiogram in 1 year. Echocardiography is a painless test that uses sound waves to create images of your heart. It provides your doctor with information about the size and shape of your heart and how well your heart's chambers and valves are working. This procedure takes approximately one hour. There are no restrictions for this procedure.  Follow-Up: At [facility], you and your health needs are our priority.  As part of our continuing mission to provide you with exceptional heart care, we have created designated Provider Care Teams.  These Care Teams include your primary Cardiologist (physician) and Advanced Practice Providers (APPs -  Physician Assistants and Nurse Practitioners) who all work together to provide you with the care you need, when you need it.  We recommend signing up for the patient portal called "MyChart".  Sign up information is provided on this After Visit Summary.  MyChart is used to connect with patients for Virtual Visits (Telemedicine).  Patients are able to view lab/test results, encounter notes, upcoming appointments, etc.  Non-urgent messages can be sent to your provider as well.   To learn more about what you can do with MyChart, go to [email or web address].    Your next appointment:   1 year(s)  The format for your next appointment:   In Person  Provider:   Donato Schultz, MD  Thank you for choosing [facility]!!  '

## 2021-03-03 NOTE — Assessment & Plan Note (Signed)
Remote history.  No further sequelae.  Off of anticoagulation.  Watchman.

## 2021-03-03 NOTE — Progress Notes (Signed)
Cardiology Office Note:    Date:  03/03/2021   ID:  Kayla Calhoun, DOB Feb 21, 1941, MRN [id number]  PCP:  Dorothyann Peng, MD   [facility] Providers Cardiologist:  None Electrophysiologist:  Lanier Prude, MD     Referring MD: Dorothyann Peng, MD    History of Present Illness:    Kayla Calhoun is a 80 y.o. female here for follow-up of atrial fibrillation with recent watchman 12/22/2020 device placement.  Follow-up TEE on 02/01/2021 showed normal watchman.  Has moderate central mitral regurgitation.  Now off of anticoagulation.  Currently on aspirin and Plavix.  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  Overall doing well post watchman.  Things went well.  She feels good.  No fevers chills nausea vomiting syncope bleeding.  Past Medical History:  Diagnosis Date   Atrial fibrillation (HCC)    Glaucoma    Hypertension    Vitamin D deficiency     Past Surgical History:  Procedure Laterality Date   ABDOMINAL HYSTERECTOMY     KIDNEY SURGERY     LEFT ATRIAL APPENDAGE OCCLUSION N/A 12/22/2020   Procedure: LEFT ATRIAL APPENDAGE OCCLUSION;  Surgeon: Lanier Prude, MD;  Location: [facility] INVASIVE CV LAB;  Service: Cardiovascular;  Laterality: N/A;   TEE WITHOUT CARDIOVERSION N/A 12/22/2020   Procedure: TRANSESOPHAGEAL ECHOCARDIOGRAM (TEE);  Surgeon: Lanier Prude, MD;  Location: [facility] INVASIVE CV LAB;  Service: Cardiovascular;  Laterality: N/A;   TEE WITHOUT CARDIOVERSION N/A 02/01/2021   Procedure: TRANSESOPHAGEAL ECHOCARDIOGRAM (TEE);  Surgeon: Thurmon Fair, MD;  Location: [facility] ENDOSCOPY;  Service: Cardiovascular;  Laterality: N/A;   TONSILLECTOMY      Current Medications: Current Meds  Medication Sig   acetaminophen (TYLENOL) 500 MG tablet Take 500 mg by mouth every 6 (six) hours as needed for mild pain or moderate pain.   ALPHAGAN P 0.1 % SOLN Place 1 drop into both eyes in the morning and at  bedtime.   amLODipine (NORVASC) 5 MG tablet TAKE 1 TABLET BY MOUTH EVERY DAY   Ascorbic Acid (VITAMIN C) 1000 MG tablet Take 1,000 mg by mouth every other day. In the morning   aspirin EC 81 MG tablet Take 1 tablet (81 mg total) by mouth daily. Swallow whole.   cetirizine (ZYRTEC) 10 MG tablet Take 5 mg by mouth daily as needed for allergies.   Cholecalciferol (VITAMIN D3) 50 MCG (2000 UT) TABS Take 2,000 Units by mouth daily.   clopidogrel (PLAVIX) 75 MG tablet Take 1 tablet (75 mg total) by mouth daily.   levobunolol (BETAGAN) 0.5 % ophthalmic solution Place 1 drop into both eyes 2 (two) times daily.   metoprolol succinate (TOPROL-XL) 50 MG 24 hr tablet Take 1 tablet (50 mg total) by mouth daily. Take with or immediately following a meal.   Noni, Morinda citrifolia, (NONI JUICE PO) Take 60 mLs by mouth every other day.   TRAVATAN Z 0.004 % SOLN ophthalmic solution Place 1 drop into both eyes at bedtime.   triamterene-hydrochlorothiazide (MAXZIDE-25) 37.5-25 MG tablet TAKE 1/2 TABLET BY MOUTH EVERY DAY   vitamin E 180 MG (400 UNITS) capsule Take 400 Units by mouth 2 (two) times a week.     Allergies:   Contrast media [iodinated diagnostic agents]   Social History   Socioeconomic History   Marital status: Widowed    Spouse name: Not on file   Number of children: 1   Years of education: Not  on file   Highest education level: Not on file  Occupational History   Occupation: retired  Tobacco Use   Smoking status: Former    Years: 10.00    Types: Cigarettes   Smokeless tobacco: Never   Tobacco comments:    been quit 35 years  Vaping Use   Vaping Use: Never used  Substance and Sexual Activity   Alcohol use: Not Currently   Drug use: Never   Sexual activity: Not Currently  Other Topics Concern   Not on file  Social History Narrative   Not on file   Social Determinants of Health   Financial Resource Strain: Low Risk    Difficulty of Paying Living Expenses: Not hard at all   Food Insecurity: No Food Insecurity   Worried About Programme researcher, broadcasting/film/video in the Last Year: Never true   Ran Out of Food in the Last Year: Never true  Transportation Needs: No Transportation Needs   Lack of Transportation (Medical): No   Lack of Transportation (Non-Medical): No  Physical Activity: Inactive   Days of Exercise per Week: 0 days   Minutes of Exercise per Session: 0 min  Stress: No Stress Concern Present   Feeling of Stress : Not at all  Social Connections: Not on file     Family History: The patient's family history includes Hypertension in her brother and sister; Prostate cancer in her father; Stroke in her brother, mother, and sister.  ROS:   Please see the history of present illness.     All other systems reviewed and are negative.  EKGs/Labs/Other Studies Reviewed:    The following studies were reviewed today: TEE   Recent Labs: 11/02/2020: ALT 12 01/25/2021: BUN 15; Creatinine, Ser 0.86; Hemoglobin 10.4; Platelets 264; Potassium 4.1; Sodium 139  Recent Lipid Panel    Component Value Date/Time   CHOL 167 11/02/2020 1136   TRIG 60 11/02/2020 1136   HDL 56 11/02/2020 1136   CHOLHDL 3.0 11/02/2020 1136   LDLCALC 99 11/02/2020 1136     Risk Assessment/Calculations:          Physical Exam:    VS:  BP 120/70 (BP Location: Left Arm, Patient Position: Sitting, Cuff Size: Normal)   Pulse 98   Ht 5\' 2"  (1.575 m)   Wt 182 lb (82.6 kg)   SpO2 98%   BMI 33.29 kg/m     Wt Readings from Last 3 Encounters:  03/03/21 182 lb (82.6 kg)  02/01/21 181 lb (82.1 kg)  01/25/21 182 lb 9.6 oz (82.8 kg)     GEN:  Well nourished, well developed in no acute distress HEENT: Normal NECK: No JVD; No carotid bruits LYMPHATICS: No lymphadenopathy CARDIAC: Irreg normal rate, soft systolic murmur, no rubs, gallops RESPIRATORY:  Clear to auscultation without rales, wheezing or rhonchi  ABDOMEN: Soft, non-tender, non-distended MUSCULOSKELETAL:  Chronic LE edema; No  deformity  SKIN: Warm and dry NEUROLOGIC:  Alert and oriented x 3 PSYCHIATRIC:  Normal affect   ASSESSMENT:    1. Moderate mitral insufficiency   2. Permanent atrial fibrillation (HCC)   3. Presence of Watchman left atrial appendage closure device   4. Primary hypertension   5. History of subarachnoid hemorrhage   6. Nonrheumatic mitral valve regurgitation    PLAN:    In order of problems listed above:  Atrial fibrillation [facility]) Doing well post watchman device July 2022.,  Dr. August 2022.  Doing well no bleeding no strokelike symptoms.  She is off of her  Eliquis.  She is still taking dual antiplatelet therapy with Plavix and aspirin.  In January when she sees Dr. Lalla Brothers again, she may be able to come off of the Plavix after 6 months of therapy.  Postop TEE reviewed, excellent. For atrial fibrillation currently is well rate controlled.  Presence of Watchman left atrial appendage closure device As described above, Dr. Lalla Brothers.  July 2022.  Excellent results.  Postop the reassuring.  HTN (hypertension) Well-controlled.  120/70.  Continue with Maxide, amlodipine 5 mg daily.  No changes in medical management.  History of subarachnoid hemorrhage Remote history.  No further sequelae.  Off of anticoagulation.  Watchman.  Mitral regurgitation Moderate central mitral regurgitation.  We will repeat echocardiogram in 1 year.  No shortness of breath currently.        Medication Adjustments/Labs and Tests Ordered: Current medicines are reviewed at length with the patient today.  Concerns regarding medicines are outlined above.  Orders Placed This Encounter  Procedures   ECHOCARDIOGRAM COMPLETE   No orders of the defined types were placed in this encounter.   Patient Instructions  Medication Instructions:  The current medical regimen is effective;  continue present plan and medications.  *If you need a refill on your cardiac medications before your next appointment, please call your  pharmacy*  Your physician has requested that you have an echocardiogram in 1 year. Echocardiography is a painless test that uses sound waves to create images of your heart. It provides your doctor with information about the size and shape of your heart and how well your heart's chambers and valves are working. This procedure takes approximately one hour. There are no restrictions for this procedure.  Follow-Up: At [facility], you and your health needs are our priority.  As part of our continuing mission to provide you with exceptional heart care, we have created designated Provider Care Teams.  These Care Teams include your primary Cardiologist (physician) and Advanced Practice Providers (APPs -  Physician Assistants and Nurse Practitioners) who all work together to provide you with the care you need, when you need it.  We recommend signing up for the patient portal called "MyChart".  Sign up information is provided on this After Visit Summary.  MyChart is used to connect with patients for Virtual Visits (Telemedicine).  Patients are able to view lab/test results, encounter notes, upcoming appointments, etc.  Non-urgent messages can be sent to your provider as well.   To learn more about what you can do with MyChart, go to [email or web address].    Your next appointment:   1 year(s)  The format for your next appointment:   In Person  Provider:   Donato Schultz, MD  Thank you for choosing [facility]!!  '   Signed, Donato Schultz, MD  03/03/2021 10:11 AM    [facility]

## 2021-03-03 NOTE — Assessment & Plan Note (Signed)
Moderate central mitral regurgitation.  We will repeat echocardiogram in 1 year.  No shortness of breath currently.

## 2021-03-09 ENCOUNTER — Encounter: Payer: Self-pay | Admitting: Internal Medicine

## 2021-03-15 ENCOUNTER — Other Ambulatory Visit (HOSPITAL_COMMUNITY): Payer: Self-pay | Admitting: Physician Assistant

## 2021-04-06 ENCOUNTER — Other Ambulatory Visit: Payer: Self-pay | Admitting: Cardiology

## 2021-05-09 ENCOUNTER — Other Ambulatory Visit: Payer: Self-pay

## 2021-05-09 ENCOUNTER — Ambulatory Visit (INDEPENDENT_AMBULATORY_CARE_PROVIDER_SITE_OTHER): Payer: Medicare Other | Admitting: Internal Medicine

## 2021-05-09 ENCOUNTER — Encounter: Payer: Self-pay | Admitting: Internal Medicine

## 2021-05-09 VITALS — BP 128/70 | HR 93 | Temp 97.8°F | Ht 62.0 in | Wt 183.2 lb

## 2021-05-09 DIAGNOSIS — E6609 Other obesity due to excess calories: Secondary | ICD-10-CM | POA: Diagnosis not present

## 2021-05-09 DIAGNOSIS — L659 Nonscarring hair loss, unspecified: Secondary | ICD-10-CM

## 2021-05-09 DIAGNOSIS — R7303 Prediabetes: Secondary | ICD-10-CM | POA: Diagnosis not present

## 2021-05-09 DIAGNOSIS — I1310 Hypertensive heart and chronic kidney disease without heart failure, with stage 1 through stage 4 chronic kidney disease, or unspecified chronic kidney disease: Secondary | ICD-10-CM

## 2021-05-09 DIAGNOSIS — Z6833 Body mass index (BMI) 33.0-33.9, adult: Secondary | ICD-10-CM | POA: Diagnosis not present

## 2021-05-09 DIAGNOSIS — Z2821 Immunization not carried out because of patient refusal: Secondary | ICD-10-CM | POA: Diagnosis not present

## 2021-05-09 DIAGNOSIS — Z23 Encounter for immunization: Secondary | ICD-10-CM | POA: Diagnosis not present

## 2021-05-09 MED ORDER — AMLODIPINE BESYLATE 5 MG PO TABS: 5.0000 mg | ORAL_TABLET | Freq: Every day | ORAL | 2 refills | Status: DC

## 2021-05-09 MED ORDER — TRIAMTERENE-HCTZ 37.5-25 MG PO TABS: ORAL_TABLET | ORAL | 1 refills | Status: DC

## 2021-05-09 MED ORDER — SHINGRIX 50 MCG/0.5ML IM SUSR: 0.5000 mL | Freq: Once | INTRAMUSCULAR | 0 refills | Status: AC

## 2021-05-09 NOTE — Progress Notes (Signed)
I,Tianna Badgett,acting as a Education administrator for [name], MD.,have documented all relevant documentation on the behalf of [name], MD,as directed by  [name], MD while in the presence of [name], MD.  This visit occurred during the SARS-CoV-2 public health emergency.  Safety protocols were in place, including screening questions prior to the visit, additional usage of staff PPE, and extensive cleaning of exam room while observing appropriate contact time as indicated for disinfecting solutions.  Subjective:     Patient ID: Kayla Calhoun , female    DOB: 10/22/1940 , 80 y.o.   MRN: [id number]   Chief Complaint  Patient presents with   Hypertension    HPI  She presents today for BP check. She reports compliance with meds. She has no specific concerns or complaints at this time. She underwent Watchman procedure July 2022. She did not suffer any complications.   Hypertension This is a chronic problem. The current episode started more than 1 year ago. The problem is controlled. Pertinent negatives include no blurred vision, chest pain, palpitations or shortness of breath. Risk factors for coronary artery disease include obesity, sedentary lifestyle and post-menopausal state. Past treatments include calcium channel blockers, beta blockers and diuretics. The current treatment provides mild improvement. Hypertensive end-organ damage includes kidney disease.    Past Medical History:  Diagnosis Date   Atrial fibrillation ([place])    Glaucoma    Hypertension    Vitamin D deficiency      Family History  Problem Relation Age of Onset   Stroke Mother    Prostate cancer Father    Stroke Sister    Hypertension Sister    Hypertension Brother    Stroke Brother      Current Outpatient Medications:    acetaminophen (TYLENOL) 500 MG tablet, Take 500 mg by mouth every 6 (six) hours as needed for mild pain or moderate pain., Disp: , Rfl:    ALPHAGAN P 0.1 % SOLN, Place 1  drop into both eyes in the morning and at bedtime., Disp: , Rfl:    Ascorbic Acid (VITAMIN C) 1000 MG tablet, Take 1,000 mg by mouth every other day. In the morning, Disp: , Rfl:    ASPIRIN LOW DOSE 81 MG EC tablet, TAKE 1 TABLET (81 MG TOTAL) BY MOUTH DAILY. SWALLOW WHOLE., Disp: 30 tablet, Rfl: 11   cetirizine (ZYRTEC) 10 MG tablet, Take 5 mg by mouth daily as needed for allergies., Disp: , Rfl:    Cholecalciferol (VITAMIN D3) 50 MCG (2000 UT) TABS, Take 2,000 Units by mouth daily., Disp: , Rfl:    clopidogrel (PLAVIX) 75 MG tablet, Take 1 tablet (75 mg total) by mouth daily., Disp: 90 tablet, Rfl: 1   levobunolol (BETAGAN) 0.5 % ophthalmic solution, Place 1 drop into both eyes 2 (two) times daily., Disp: , Rfl:    metoprolol succinate (TOPROL-XL) 50 MG 24 hr tablet, TAKE 1 TABLET BY MOUTH DAILY. TAKE WITH OR IMMEDIATELY FOLLOWING A MEAL., Disp: 90 tablet, Rfl: 2   Noni, Morinda citrifolia, (NONI JUICE PO), Take 60 mLs by mouth every other day., Disp: , Rfl:    TRAVATAN Z 0.004 % SOLN ophthalmic solution, Place 1 drop into both eyes at bedtime., Disp: , Rfl:    vitamin E 180 MG (400 UNITS) capsule, Take 400 Units by mouth 2 (two) times a week., Disp: , Rfl:    Zoster Vaccine Adjuvanted (SHINGRIX) injection, Inject 0.5 mLs into the muscle once for 1 dose., Disp:  0.5 mL, Rfl: 0   amLODipine (NORVASC) 5 MG tablet, Take 1 tablet (5 mg total) by mouth daily., Disp: 90 tablet, Rfl: 2   triamterene-hydrochlorothiazide (MAXZIDE-25) 37.5-25 MG tablet, TAKE 1/2 TABLET BY MOUTH EVERY DAY, Disp: 90 tablet, Rfl: 1   Allergies  Allergen Reactions   Contrast Media [Iodinated Diagnostic Agents] Rash    Skin peeled (head to heel)     Review of Systems  Constitutional: Negative.   Eyes:  Negative for blurred vision.  Respiratory: Negative.  Negative for shortness of breath.   Cardiovascular: Negative.  Negative for chest pain and palpitations.  Gastrointestinal: Negative.   Skin:        Hair loss     Neurological: Negative.     Today's Vitals   05/09/21 1119  BP: 128/70  Pulse: 93  Temp: 97.8 F (36.6 C)  TempSrc: Oral  Weight: 183 lb 3.2 oz (83.1 kg)  Height: _0  (1.575 m)   Body mass index is 33.51 kg/m.  Wt Readings from Last 3 Encounters:  05/09/21 183 lb 3.2 oz (83.1 kg)  03/03/21 182 lb (82.6 kg)  02/01/21 181 lb (82.1 kg)    Objective:  Physical Exam Vitals and nursing note reviewed.  Constitutional:      Appearance: Normal appearance. She is obese.  HENT:     Head: Normocephalic and atraumatic.     Nose:     Comments: Masked     Mouth/Throat:     Comments: Masked  Eyes:     Extraocular Movements: Extraocular movements intact.  Cardiovascular:     Rate and Rhythm: Normal rate. Rhythm irregular.     Heart sounds: Murmur heard.  Pulmonary:     Effort: Pulmonary effort is normal.     Breath sounds: Normal breath sounds.  Musculoskeletal:     Cervical back: Normal range of motion.     Right lower leg: Edema present.     Left lower leg: Edema present.  Skin:    General: Skin is warm.  Neurological:     General: No focal deficit present.     Mental Status: She is alert.  Psychiatric:        Mood and Affect: Mood normal.        Behavior: Behavior normal.        Assessment And Plan:     1. Hypertensive heart and renal disease with renal failure, stage 1 through stage 4 or unspecified chronic kidney disease, without heart failure Comments: Chronic, well controlled. No med changes. She is encouraged to follow low sodium diet. She will f/u in six months for re-evaluation. I will check renal fxn. - CMP14+EGFR  2. Hair loss Comments: I will check labs as listed below. I will make further recommendations once her labs are available for review.  - ANA, IFA (with reflex) - TSH  3. Prediabetes Comments: Her a1c has been elevated in the past. I will recheck this today. She is encouraged to limit her intake of sweetened beverages, including diet drinks.   - Hemoglobin A1c - CMP14+EGFR  4. Class 1 obesity due to excess calories with serious comorbidity and body mass index (BMI) of 33.0 to 33.9 in adult Comments: She is encouraged to strive for BMI less than 30 to decrease cardiac risk. Advised to aim for at least 150 minutes of exercise per week.    5. Immunization due Comments: I will send rx Shingrix to her local pharmacy.   6. Influenza vaccination declined  Patient was given opportunity to ask questions. Patient verbalized understanding of the plan and was able to repeat key elements of the plan. All questions were answered to their satisfaction.   I, [name], MD, have reviewed all documentation for this visit. The documentation on 05/09/21 for the exam, diagnosis, procedures, and orders are all accurate and complete.   IF YOU HAVE BEEN REFERRED TO A SPECIALIST, IT MAY TAKE 1-2 WEEKS TO SCHEDULE/PROCESS THE REFERRAL. IF YOU HAVE NOT HEARD FROM US/SPECIALIST IN TWO WEEKS, PLEASE GIVE [place] A CALL AT [phone].   THE PATIENT IS ENCOURAGED TO PRACTICE SOCIAL DISTANCING DUE TO THE COVID-19 PANDEMIC.

## 2021-05-16 LAB — CMP14+EGFR
ALT: 15 IU/L (ref 0–32)
AST: 21 IU/L (ref 0–40)
Albumin/Globulin Ratio: 2.2 (ref 1.2–2.2)
Albumin: 4.6 g/dL (ref 3.6–4.6)
Alkaline Phosphatase: 70 IU/L (ref 44–121)
BUN/Creatinine Ratio: 17 (ref 12–28)
BUN: 17 mg/dL (ref 8–27)
Bilirubin Total: 0.5 mg/dL (ref 0.0–1.2)
CO2: 28 mmol/L (ref 20–29)
Calcium: 10.2 mg/dL (ref 8.7–10.3)
Chloride: 101 mmol/L (ref 96–106)
Creatinine, Ser: 1 mg/dL (ref 0.57–1.00)
Globulin, Total: 2.1 g/dL (ref 1.5–4.5)
Glucose: 88 mg/dL (ref 70–99)
Potassium: 4.7 mmol/L (ref 3.5–5.2)
Sodium: 140 mmol/L (ref 134–144)
Total Protein: 6.7 g/dL (ref 6.0–8.5)
eGFR: 56 mL/min/{1.73_m2} — ABNORMAL LOW (ref >59)

## 2021-05-16 LAB — ANTINUCLEAR ANTIBODIES, IFA: ANA Titer 1: NEGATIVE

## 2021-05-16 LAB — HEMOGLOBIN A1C
Est. average glucose Bld gHb Est-mCnc: 117 mg/dL
Hgb A1c MFr Bld: 5.7 % — ABNORMAL HIGH (ref 4.8–5.6)

## 2021-05-16 LAB — TSH: TSH: 1.56 u[IU]/mL (ref 0.450–4.500)

## 2021-05-26 DIAGNOSIS — H2513 Age-related nuclear cataract, bilateral: Secondary | ICD-10-CM | POA: Diagnosis not present

## 2021-05-26 DIAGNOSIS — H5203 Hypermetropia, bilateral: Secondary | ICD-10-CM | POA: Diagnosis not present

## 2021-05-26 DIAGNOSIS — H401123 Primary open-angle glaucoma, left eye, severe stage: Secondary | ICD-10-CM | POA: Diagnosis not present

## 2021-05-26 DIAGNOSIS — R7303 Prediabetes: Secondary | ICD-10-CM | POA: Diagnosis not present

## 2021-05-26 LAB — HM DIABETES EYE EXAM

## 2021-05-30 ENCOUNTER — Encounter: Payer: Self-pay | Admitting: Internal Medicine

## 2021-06-27 ENCOUNTER — Ambulatory Visit: Payer: Medicare Other | Admitting: Cardiology

## 2021-07-13 NOTE — Progress Notes (Signed)
Electrophysiology Office Follow up Visit Note:    Date:  07/14/2021   ID:  Kayla, Calhoun 06-12-1941, MRN [id number]  PCP:  Glendale Chard, MD  [facility] Cardiologist:  None  [facility] Electrophysiologist:  Vickie Epley, MD    Interval History:    Kayla Calhoun is a 81 y.o. female who presents for a follow up visit.  She underwent a successful watchman implant December 22, 2020.  Follow-up TEE on February 01, 2021 showed well-seated device without thrombus or leak.  She is currently maintained on aspirin and Plavix.  She has done well.  She remains very active.     Past Medical History:  Diagnosis Date   Atrial fibrillation ([place])    Glaucoma    Hypertension    Vitamin D deficiency     Past Surgical History:  Procedure Laterality Date   ABDOMINAL HYSTERECTOMY     KIDNEY SURGERY     LEFT ATRIAL APPENDAGE OCCLUSION N/A 12/22/2020   Procedure: LEFT ATRIAL APPENDAGE OCCLUSION;  Surgeon: Vickie Epley, MD;  Location: [place] [facility];  Service: Cardiovascular;  Laterality: N/A;   TEE WITHOUT CARDIOVERSION N/A 12/22/2020   Procedure: TRANSESOPHAGEAL ECHOCARDIOGRAM (TEE);  Surgeon: Vickie Epley, MD;  Location: [place] [facility];  Service: Cardiovascular;  Laterality: N/A;   TEE WITHOUT CARDIOVERSION N/A 02/01/2021   Procedure: TRANSESOPHAGEAL ECHOCARDIOGRAM (TEE);  Surgeon: Sanda Klein, MD;  Location: [facility];  Service: Cardiovascular;  Laterality: N/A;   TONSILLECTOMY      Current Medications: Current Meds  Medication Sig   acetaminophen (TYLENOL) 500 MG tablet Take 500 mg by mouth every 6 (six) hours as needed for mild pain or moderate pain.   ALPHAGAN P 0.1 % SOLN Place 1 drop into both eyes in the morning and at bedtime.   amLODipine (NORVASC) 5 MG tablet Take 1 tablet (5 mg total) by mouth daily.   Ascorbic Acid (VITAMIN C) 1000 MG tablet Take 1,000 mg by mouth every other day. In the morning   ASPIRIN LOW DOSE 81 MG EC tablet TAKE 1  TABLET (81 MG TOTAL) BY MOUTH DAILY. SWALLOW WHOLE.   cetirizine (ZYRTEC) 10 MG tablet Take 5 mg by mouth daily as needed for allergies.   Cholecalciferol (VITAMIN D3) 50 MCG (2000 UT) TABS Take 2,000 Units by mouth daily.   clopidogrel (PLAVIX) 75 MG tablet Take 1 tablet (75 mg total) by mouth daily.   levobunolol (BETAGAN) 0.5 % ophthalmic solution Place 1 drop into both eyes 2 (two) times daily.   metoprolol succinate (TOPROL-XL) 50 MG 24 hr tablet TAKE 1 TABLET BY MOUTH DAILY. TAKE WITH OR IMMEDIATELY FOLLOWING A MEAL.   Noni, Morinda citrifolia, (NONI JUICE PO) Take 60 mLs by mouth every other day.   TRAVATAN Z 0.004 % SOLN ophthalmic solution Place 1 drop into both eyes at bedtime.   triamterene-hydrochlorothiazide (MAXZIDE-25) 37.5-25 MG tablet TAKE 1/2 TABLET BY MOUTH EVERY DAY   vitamin E 180 MG (400 UNITS) capsule Take 400 Units by mouth 2 (two) times a week.     Allergies:   Contrast media [iodinated contrast media]   Social History   Socioeconomic History   Marital status: Widowed    Spouse name: Not on file   Number of children: 1   Years of education: Not on file   Highest education level: Not on file  Occupational History   Occupation: retired  Tobacco Use   Smoking status: Former    Years: 10.00  Types: Cigarettes   Smokeless tobacco: Never   Tobacco comments:    been quit 35 years  Vaping Use   Vaping Use: Never used  Substance and Sexual Activity   Alcohol use: Not Currently   Drug use: Never   Sexual activity: Not Currently  Other Topics Concern   Not on file  Social History Narrative   Not on file   Social Determinants of Health   Financial Resource Strain: Low Risk    Difficulty of Paying Living Expenses: Not hard at all  Food Insecurity: No Food Insecurity   Worried About Charity fundraiser in the Last Year: Never true   [place] in the Last Year: Never true  Transportation Needs: No Transportation Needs   Lack of Transportation  (Medical): No   Lack of Transportation (Non-Medical): No  Physical Activity: Inactive   Days of Exercise per Week: 0 days   Minutes of Exercise per Session: 0 min  Stress: No Stress Concern Present   Feeling of Stress : Not at all  Social Connections: Not on file     Family History: The patient's family history includes Hypertension in her brother and sister; Prostate cancer in her father; Stroke in her brother, mother, and sister.  ROS:   Please see the history of present illness.    All other systems reviewed and are negative.  EKGs/Labs/Other Studies Reviewed:    The following studies were reviewed today:  August 2022 transesophageal echo reviewed and shows no thrombus or leak around the watchman device   Recent Labs: 03/03/2021: Hemoglobin 11.6; Platelets 264 05/09/2021: ALT 15; BUN 17; Creatinine, Ser 1.00; Potassium 4.7; Sodium 140; TSH 1.560  Recent Lipid Panel    Component Value Date/Time   CHOL 167 11/02/2020 1136   TRIG 60 11/02/2020 1136   HDL 56 11/02/2020 1136   CHOLHDL 3.0 11/02/2020 1136   LDLCALC 99 11/02/2020 1136    Physical Exam:    VS:  BP 128/80    Pulse (!) 103    Ht 5\' 2"  (1.575 m)    Wt 180 lb 6.4 oz (81.8 kg)    SpO2 96%    BMI 33.00 kg/m     Wt Readings from Last 3 Encounters:  07/14/21 180 lb 6.4 oz (81.8 kg)  05/09/21 183 lb 3.2 oz (83.1 kg)  03/03/21 182 lb (82.6 kg)     GEN:  Well nourished, well developed in no acute distress HEENT: Normal NECK: No JVD; No carotid bruits LYMPHATICS: No lymphadenopathy CARDIAC: Irregularly irregular, no murmurs, rubs, gallops RESPIRATORY:  Clear to auscultation without rales, wheezing or rhonchi  ABDOMEN: Soft, non-tender, non-distended MUSCULOSKELETAL:  No edema; No deformity  SKIN: Warm and dry NEUROLOGIC:  Alert and oriented x 3 PSYCHIATRIC:  Normal affect        ASSESSMENT:    1. Permanent atrial fibrillation ([place])   2. Presence of Watchman left atrial appendage closure device   3.  Subarachnoid bleed ([facility])    PLAN:    In order of problems listed above:   #Permanent atrial fibrillation Rate controlled.  She is now post watchman implant.  She is completed 6 months of therapy.  We will transition her to aspirin 81 mg by mouth once daily.  She can stop her Plavix today.  She can follow-up with me on an as-needed basis.  Medication Adjustments/Labs and Tests Ordered: Current medicines are reviewed at length with the patient today.  Concerns regarding medicines are outlined above.  No orders of the defined types were placed in this encounter.  No orders of the defined types were placed in this encounter.    Signed, Lars Mage, MD, [facility], [facility] 07/14/2021 9:56 AM    Electrophysiology [facility]

## 2021-07-14 ENCOUNTER — Other Ambulatory Visit: Payer: Self-pay

## 2021-07-14 ENCOUNTER — Encounter: Payer: Self-pay | Admitting: Cardiology

## 2021-07-14 ENCOUNTER — Ambulatory Visit (INDEPENDENT_AMBULATORY_CARE_PROVIDER_SITE_OTHER): Payer: Medicare Other | Admitting: Cardiology

## 2021-07-14 VITALS — BP 128/80 | HR 103 | Ht 62.0 in | Wt 180.4 lb

## 2021-07-14 DIAGNOSIS — I609 Nontraumatic subarachnoid hemorrhage, unspecified: Secondary | ICD-10-CM

## 2021-07-14 DIAGNOSIS — I4821 Permanent atrial fibrillation: Secondary | ICD-10-CM

## 2021-07-14 DIAGNOSIS — Z95818 Presence of other cardiac implants and grafts: Secondary | ICD-10-CM | POA: Diagnosis not present

## 2021-07-14 NOTE — Patient Instructions (Addendum)
Medication Instructions:  Stop Plavix Your physician recommends that you continue on your current medications as directed. Please refer to the Current Medication list given to you today. *If you need a refill on your cardiac medications before your next appointment, please call your pharmacy*  Lab Work: None. If you have labs (blood work) drawn today and your tests are completely normal, you will receive your results only by: MyChart Message (if you have MyChart) OR A paper copy in the mail If you have any lab test that is abnormal or we need to change your treatment, we will call you to review the results.  Testing/Procedures: None.  Follow-Up: At [facility], you and your health needs are our priority.  As part of our continuing mission to provide you with exceptional heart care, we have created designated Provider Care Teams.  These Care Teams include your primary Cardiologist (physician) and Advanced Practice Providers (APPs -  Physician Assistants and Nurse Practitioners) who all work together to provide you with the care you need, when you need it.  Your physician wants you to follow-up in: As needed with Kayla Dunn, MD   We recommend signing up for the patient portal called "MyChart".  Sign up information is provided on this After Visit Summary.  MyChart is used to connect with patients for Virtual Visits (Telemedicine).  Patients are able to view lab/test results, encounter notes, upcoming appointments, etc.  Non-urgent messages can be sent to your provider as well.   To learn more about what you can do with MyChart, go to [email or web address].    Any Other Special Instructions Will Be Listed Below (If Applicable).

## 2021-07-19 NOTE — Telephone Encounter (Signed)
error 

## 2021-07-30 ENCOUNTER — Other Ambulatory Visit: Payer: Self-pay | Admitting: Cardiology

## 2021-09-14 ENCOUNTER — Other Ambulatory Visit: Payer: Self-pay | Admitting: Cardiology

## 2021-10-11 DIAGNOSIS — H401123 Primary open-angle glaucoma, left eye, severe stage: Secondary | ICD-10-CM | POA: Diagnosis not present

## 2021-10-11 DIAGNOSIS — H401112 Primary open-angle glaucoma, right eye, moderate stage: Secondary | ICD-10-CM | POA: Diagnosis not present

## 2021-10-22 IMAGING — CR DG CHEST 2V
2 series · 2 of 2 positions shown · non-contrast
Comparison: Cardiac CT evaluation from Wednesday September, 2020. See chest
x-ray April 18, 2017.

CLINICAL DATA: Persistent atrial fibrillation. History of
hypertension.

EXAM:
CHEST - 2 VIEW

[chest pa]
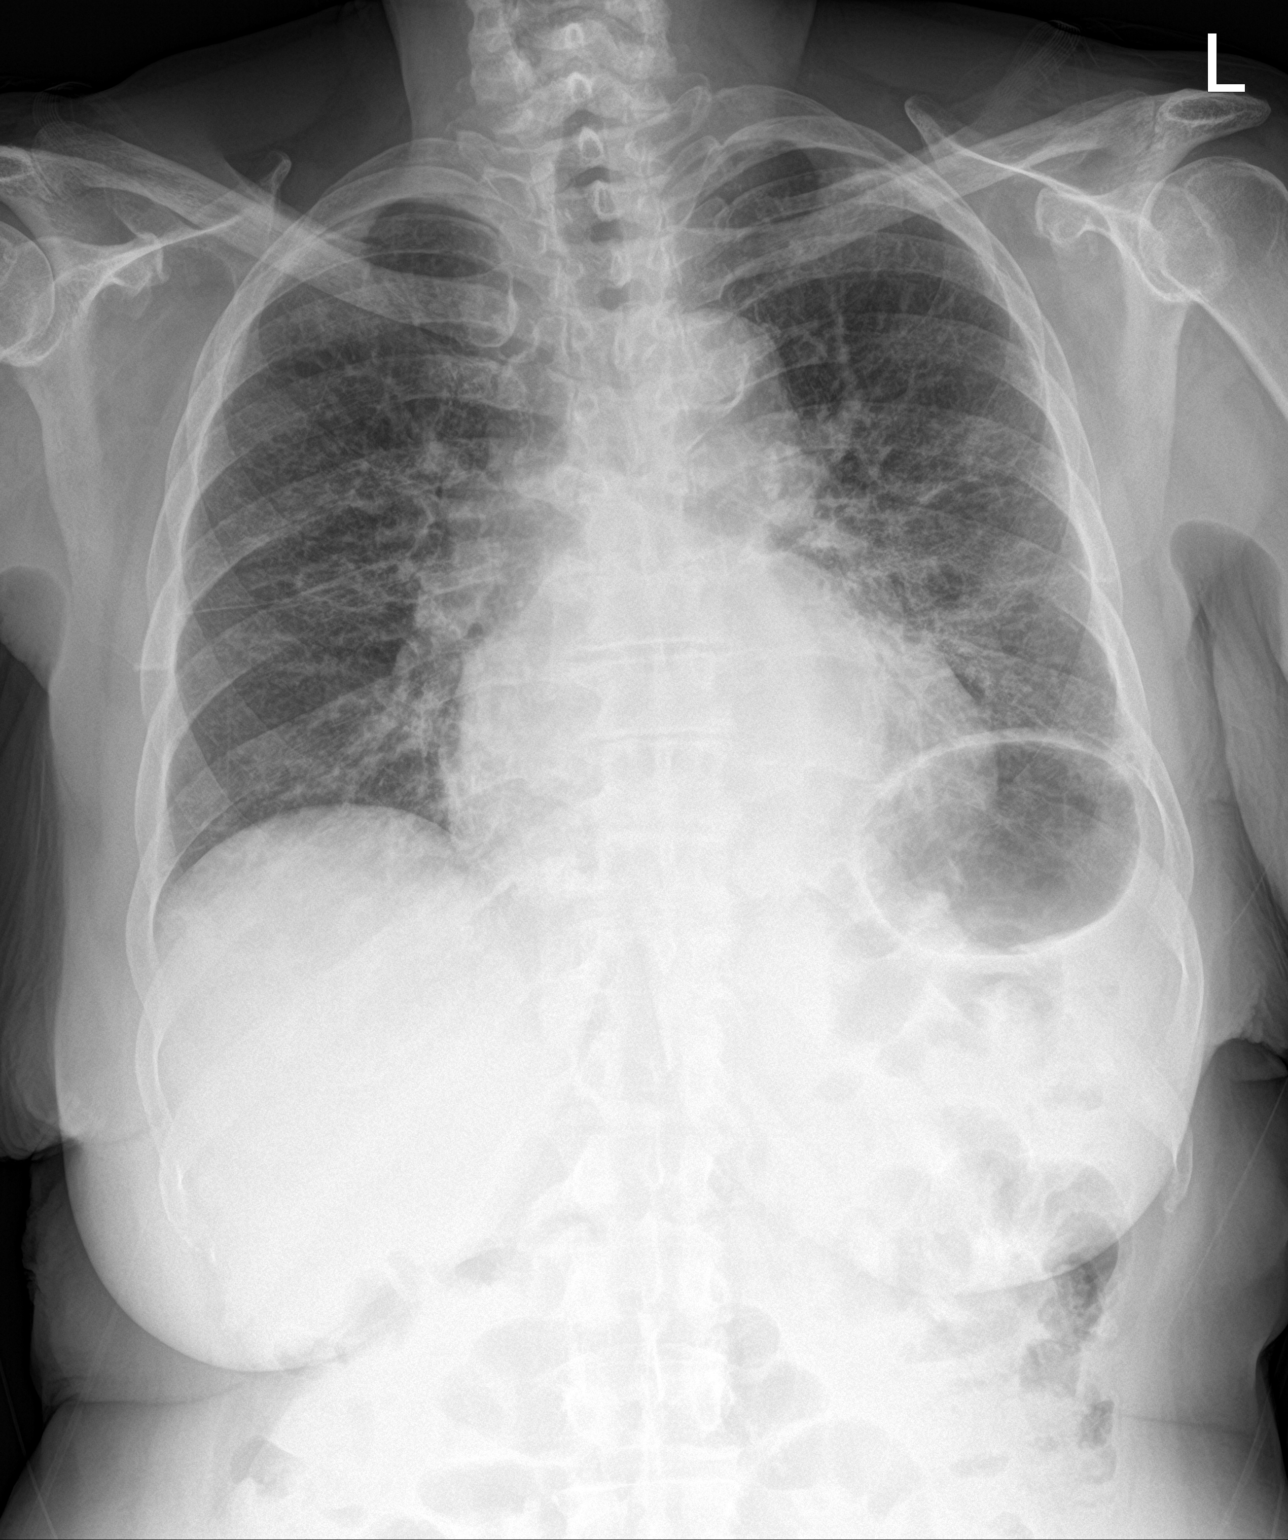

[chest lat]
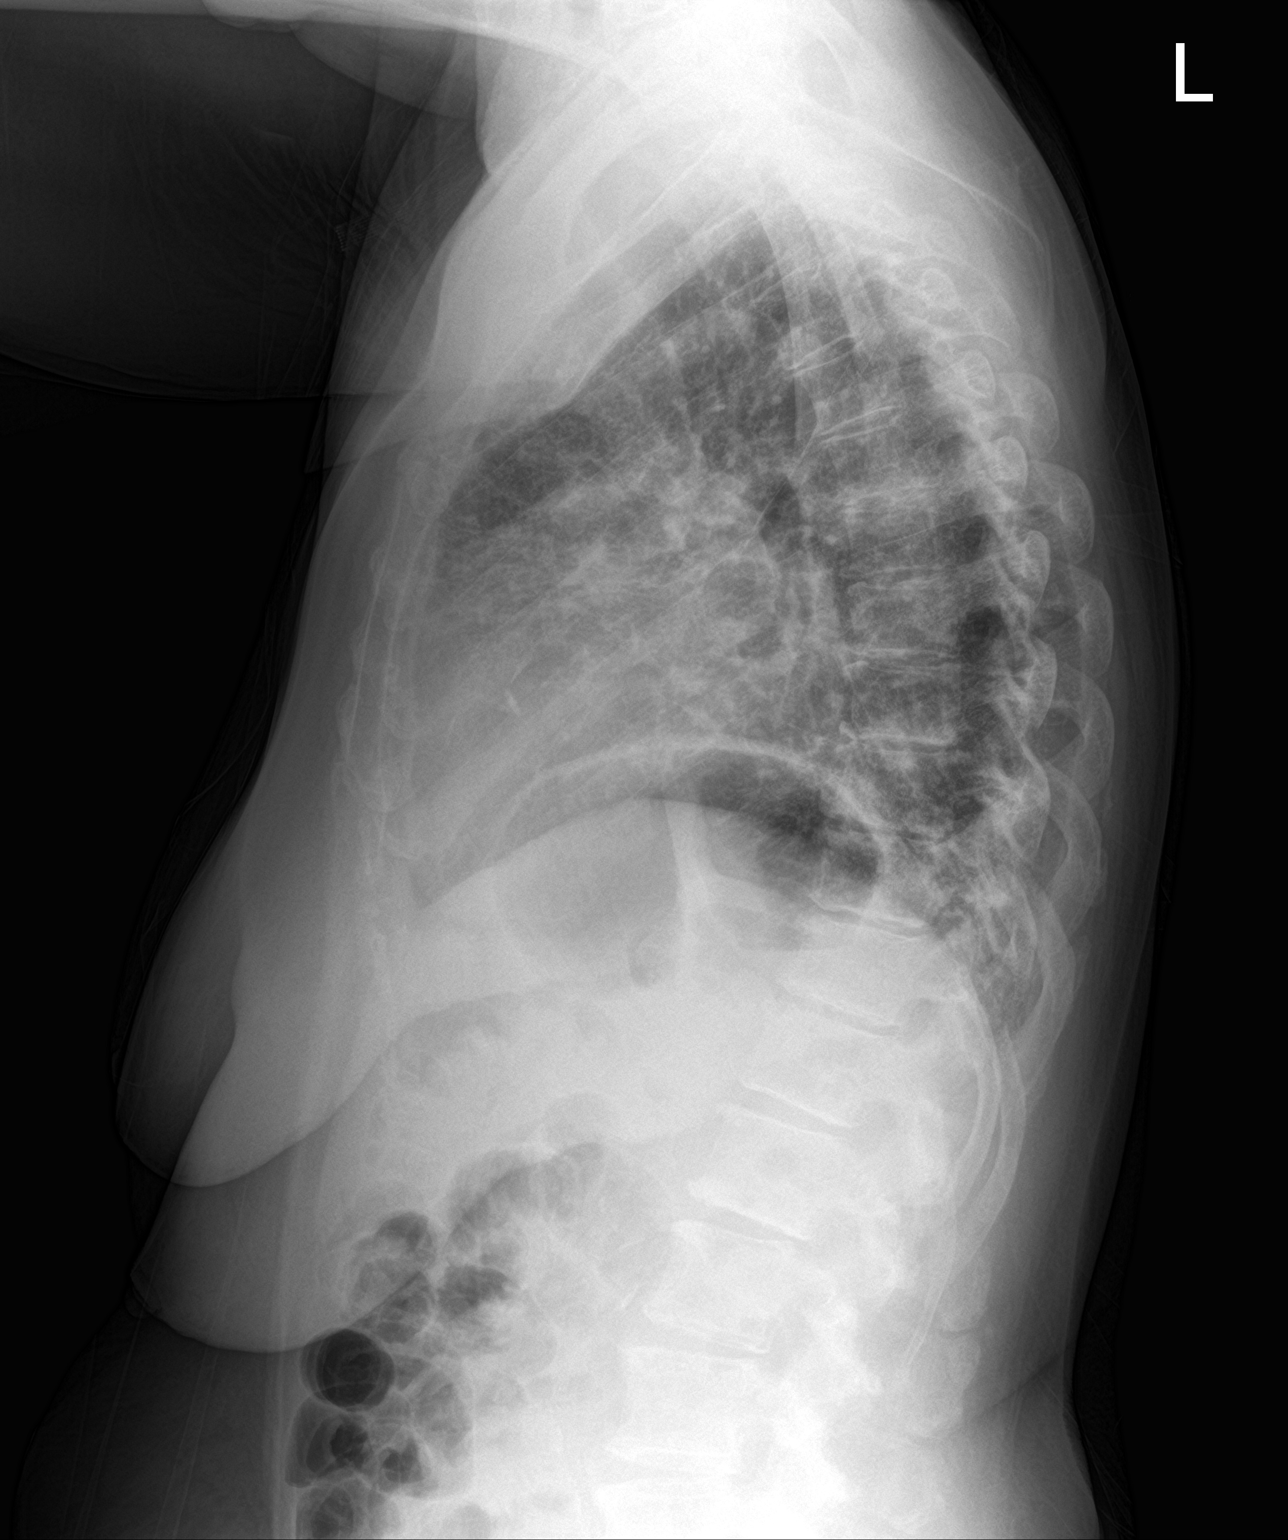

[2 of 2 positions shown; findings below may reference images not displayed]

FINDINGS: Trachea is midline. Cardiomediastinal contours and hilar structures
are stable. Mild cardiac enlargement.

No lobar consolidation.  No sign of pleural effusion.

Increased interstitial markings are noted throughout the chest with
basilar predominance which appear worsened since the chest x-ray of
7064, better visualized on the evaluation from Wednesday September, 2020.

On limited assessment no acute skeletal process.
IMPRESSION: 1. Increased interstitial markings likely reflect worsening of
chronic interstitial changes. High-resolution chest CT may be
helpful for further assessment. Difficult to exclude possibility of
superimposed edema.
2. No signs of consolidation or effusion.
3. Mild cardiomegaly.

## 2021-11-08 DIAGNOSIS — H401112 Primary open-angle glaucoma, right eye, moderate stage: Secondary | ICD-10-CM | POA: Diagnosis not present

## 2021-11-08 DIAGNOSIS — H401123 Primary open-angle glaucoma, left eye, severe stage: Secondary | ICD-10-CM | POA: Diagnosis not present

## 2021-11-30 ENCOUNTER — Ambulatory Visit: Payer: Medicare Other

## 2021-11-30 ENCOUNTER — Ambulatory Visit (INDEPENDENT_AMBULATORY_CARE_PROVIDER_SITE_OTHER): Payer: Medicare Other | Admitting: Internal Medicine

## 2021-11-30 ENCOUNTER — Encounter: Payer: Self-pay | Admitting: Internal Medicine

## 2021-11-30 ENCOUNTER — Telehealth: Payer: Self-pay

## 2021-11-30 VITALS — BP 118/64 | HR 62 | Temp 98.0°F | Ht 62.0 in | Wt 182.0 lb

## 2021-11-30 DIAGNOSIS — Z6833 Body mass index (BMI) 33.0-33.9, adult: Secondary | ICD-10-CM

## 2021-11-30 DIAGNOSIS — D6869 Other thrombophilia: Secondary | ICD-10-CM | POA: Insufficient documentation

## 2021-11-30 DIAGNOSIS — R7303 Prediabetes: Secondary | ICD-10-CM | POA: Diagnosis not present

## 2021-11-30 DIAGNOSIS — N182 Chronic kidney disease, stage 2 (mild): Secondary | ICD-10-CM

## 2021-11-30 DIAGNOSIS — I1310 Hypertensive heart and chronic kidney disease without heart failure, with stage 1 through stage 4 chronic kidney disease, or unspecified chronic kidney disease: Secondary | ICD-10-CM | POA: Diagnosis not present

## 2021-11-30 DIAGNOSIS — R269 Unspecified abnormalities of gait and mobility: Secondary | ICD-10-CM | POA: Diagnosis not present

## 2021-11-30 DIAGNOSIS — E6609 Other obesity due to excess calories: Secondary | ICD-10-CM | POA: Insufficient documentation

## 2021-11-30 DIAGNOSIS — Z23 Encounter for immunization: Secondary | ICD-10-CM

## 2021-11-30 MED ORDER — TRIAMTERENE-HCTZ 37.5-25 MG PO TABS: ORAL_TABLET | ORAL | 1 refills | Status: DC

## 2021-11-30 MED ORDER — SHINGRIX 50 MCG/0.5ML IM SUSR: 0.5000 mL | Freq: Once | INTRAMUSCULAR | 0 refills | Status: AC

## 2021-11-30 MED ORDER — AMLODIPINE BESYLATE 5 MG PO TABS: 5.0000 mg | ORAL_TABLET | Freq: Every day | ORAL | 2 refills | Status: DC

## 2021-11-30 NOTE — Progress Notes (Unsigned)
I,Tianna Badgett,acting as a Education administrator for [name], MD.,have documented all relevant documentation on the behalf of [name], MD,as directed by  [name], MD while in the presence of [name], MD.   Subjective:     Patient ID: Kayla Calhoun , female    DOB: 1940/12/28 , 81 y.o.   MRN: [id number]   Chief Complaint  Patient presents with   Hypertension    HPI  She presents today for BP check. She reports compliance with meds. She states that she is having issues with her gait. Although she has not had any falls she admits to stumbling more often. She is now using a walker to help her with balance.   Her Shingrix vaccine was rejected by [facility].   Hypertension This is a chronic problem. The current episode started more than 1 year ago. The problem is controlled. Pertinent negatives include no blurred vision, chest pain, palpitations or shortness of breath. Risk factors for coronary artery disease include obesity, sedentary lifestyle and post-menopausal state. Past treatments include calcium channel blockers, beta blockers and diuretics. The current treatment provides mild improvement. Hypertensive end-organ damage includes kidney disease.     Past Medical History:  Diagnosis Date   Atrial fibrillation ([place])    Glaucoma    Hypertension    Vitamin D deficiency      Family History  Problem Relation Age of Onset   Stroke Mother    Prostate cancer Father    Stroke Sister    Hypertension Sister    Hypertension Brother    Stroke Brother      Current Outpatient Medications:    acetaminophen (TYLENOL) 500 MG tablet, Take 500 mg by mouth every 6 (six) hours as needed for mild pain or moderate pain., Disp: , Rfl:    ALPHAGAN P 0.1 % SOLN, Place 1 drop into both eyes in the morning and at bedtime., Disp: , Rfl:    Ascorbic Acid (VITAMIN C) 1000 MG tablet, Take 1,000 mg by mouth every other day. In the morning, Disp: , Rfl:    ASPIRIN LOW DOSE 81 MG EC  tablet, TAKE 1 TABLET (81 MG TOTAL) BY MOUTH DAILY. SWALLOW WHOLE., Disp: 30 tablet, Rfl: 11   cetirizine (ZYRTEC) 10 MG tablet, Take 5 mg by mouth daily as needed for allergies., Disp: , Rfl:    Cholecalciferol (VITAMIN D3) 50 MCG (2000 UT) TABS, Take 2,000 Units by mouth daily., Disp: , Rfl:    levobunolol (BETAGAN) 0.5 % ophthalmic solution, Place 1 drop into both eyes 2 (two) times daily., Disp: , Rfl:    metoprolol succinate (TOPROL-XL) 50 MG 24 hr tablet, TAKE 1 TABLET BY MOUTH DAILY. TAKE WITH OR IMMEDIATELY FOLLOWING A MEAL., Disp: 90 tablet, Rfl: 2   Noni, Morinda citrifolia, (NONI JUICE PO), Take 60 mLs by mouth every other day., Disp: , Rfl:    TRAVATAN Z 0.004 % SOLN ophthalmic solution, Place 1 drop into both eyes at bedtime., Disp: , Rfl:    vitamin E 180 MG (400 UNITS) capsule, Take 400 Units by mouth 2 (two) times a week., Disp: , Rfl:    amLODipine (NORVASC) 5 MG tablet, Take 1 tablet (5 mg total) by mouth daily., Disp: 90 tablet, Rfl: 2   triamterene-hydrochlorothiazide (MAXZIDE-25) 37.5-25 MG tablet, TAKE 1/2 TABLET BY MOUTH EVERY DAY, Disp: 90 tablet, Rfl: 1   Allergies  Allergen Reactions   Codeine Other (See Comments)   Sulfa Antibiotics Other (See Comments)  Contrast Media [Iodinated Contrast Media] Rash    Skin peeled (head to heel)     Review of Systems  Constitutional: Negative.   Eyes:  Negative for blurred vision.  Respiratory: Negative.  Negative for shortness of breath.   Cardiovascular: Negative.  Negative for chest pain and palpitations.  Gastrointestinal: Negative.   Musculoskeletal:  Positive for gait problem.  All other systems reviewed and are negative.    Today's Vitals   11/30/21 1110  BP: 118/64  Pulse: 62  Temp: 98 F (36.7 C)  TempSrc: Oral  Weight: 182 lb (82.6 kg)  Height: 5' 2" (1.575 m)   Body mass index is 33.29 kg/m.  Wt Readings from Last 3 Encounters:  11/30/21 182 lb (82.6 kg)  07/14/21 180 lb 6.4 oz (81.8 kg)  05/09/21  183 lb 3.2 oz (83.1 kg)    Objective:  Physical Exam Vitals and nursing note reviewed.  Constitutional:      Appearance: Normal appearance.  HENT:     Head: Normocephalic and atraumatic.  Eyes:     Extraocular Movements: Extraocular movements intact.  Cardiovascular:     Rate and Rhythm: Normal rate. Rhythm irregular.     Heart sounds: Normal heart sounds.  Pulmonary:     Effort: Pulmonary effort is normal.     Breath sounds: Normal breath sounds.  Musculoskeletal:     Cervical back: Normal range of motion.     Comments: Ambulatory w/ walker  Skin:    General: Skin is warm.  Neurological:     General: No focal deficit present.     Mental Status: She is alert.  Psychiatric:        Mood and Affect: Mood normal.        Behavior: Behavior normal.      Assessment And Plan:     1. Hypertensive heart and renal disease with renal failure, stage 1 through stage 4 or unspecified chronic kidney disease, without heart failure Comments: Chronic, well controlled. She is encouraged to follow a low sodium diet.  I will check renal function today.  She will f/u in six months.  - triamterene-hydrochlorothiazide (MAXZIDE-25) 37.5-25 MG tablet; TAKE 1/2 TABLET BY MOUTH EVERY DAY  Dispense: 90 tablet; Refill: 1 - amLODipine (NORVASC) 5 MG tablet; Take 1 tablet (5 mg total) by mouth daily.  Dispense: 90 tablet; Refill: 2 - CMP14+EGFR  2. Chronic renal disease, stage II Comments: Chronic, I will check GFr today. Encouraged to avoid NSAIDs, stay hydrated and keep BP controlled to decrease risk of CKD progression.  - CMP14+EGFR  3. Gait abnormality Comments: She declines PT. I will check vitamin B12 level today.  - Vitamin B12  4. Prediabetes Comments: Her a1c has been elevated in the past. I will recheck this today. She is encouraged to limit her intake of sweetened foods/beverages.  - Hemoglobin A1c  5. Acquired thrombophilia ([place]) Comments: Currently on ASA due to permanent afib s/p  watchman placement.   6. Class 1 obesity due to excess calories with serious comorbidity and body mass index (BMI) of 33.0 to 33.9 in adult Comments: She is encouraged to aim for at least 150 minutes of exercise per week while striving for BMI<30 to decrease cardiac risk.   7. Immunization due Comments: I will send rx Shingrix to her local pharmacy.  - Zoster Vaccine Adjuvanted [facility]) injection; Inject 0.5 mLs into the muscle once for 1 dose.  Dispense: 0.5 mL; Refill: 0   Patient was given opportunity to ask  questions. Patient verbalized understanding of the plan and was able to repeat key elements of the plan. All questions were answered to their satisfaction.   I, [name], MD, have reviewed all documentation for this visit. The documentation on 12/02/21 for the exam, diagnosis, procedures, and orders are all accurate and complete.   IF YOU HAVE BEEN REFERRED TO A SPECIALIST, IT MAY TAKE 1-2 WEEKS TO SCHEDULE/PROCESS THE REFERRAL. IF YOU HAVE NOT HEARD FROM US/SPECIALIST IN TWO WEEKS, PLEASE GIVE [place] A CALL AT [phone] X [phone].   THE PATIENT IS ENCOURAGED TO PRACTICE SOCIAL DISTANCING DUE TO THE COVID-19 PANDEMIC.

## 2021-11-30 NOTE — Patient Instructions (Signed)
Hypertension, Adult High blood pressure (hypertension) is when the force of blood pumping through the arteries is too strong. The arteries are the blood vessels that carry blood from the heart throughout the body. Hypertension forces the heart to work harder to pump blood and may cause arteries to become narrow or stiff. Untreated or uncontrolled hypertension can lead to a heart attack, heart failure, a stroke, kidney disease, and other problems. A blood pressure reading consists of a higher number over a lower number. Ideally, your blood pressure should be below 120/80. The first ("top") number is called the systolic pressure. It is a measure of the pressure in your arteries as your heart beats. The second ("bottom") number is called the diastolic pressure. It is a measure of the pressure in your arteries as the heart relaxes. What are the causes? The exact cause of this condition is not known. There are some conditions that result in high blood pressure. What increases the risk? Certain factors may make you more likely to develop high blood pressure. Some of these risk factors are under your control, including: Smoking. Not getting enough exercise or physical activity. Being overweight. Having too much fat, sugar, calories, or salt (sodium) in your diet. Drinking too much alcohol. Other risk factors include: Having a personal history of heart disease, diabetes, high cholesterol, or kidney disease. Stress. Having a family history of high blood pressure and high cholesterol. Having obstructive sleep apnea. Age. The risk increases with age. What are the signs or symptoms? High blood pressure may not cause symptoms. Very high blood pressure (hypertensive crisis) may cause: Headache. Fast or irregular heartbeats (palpitations). Shortness of breath. Nosebleed. Nausea and vomiting. Vision changes. Severe chest pain, dizziness, and seizures. How is this diagnosed? This condition is diagnosed by  measuring your blood pressure while you are seated, with your arm resting on a flat surface, your legs uncrossed, and your feet flat on the floor. The cuff of the blood pressure monitor will be placed directly against the skin of your upper arm at the level of your heart. Blood pressure should be measured at least twice using the same arm. Certain conditions can cause a difference in blood pressure between your right and left arms. If you have a high blood pressure reading during one visit or you have normal blood pressure with other risk factors, you may be asked to: Return on a different day to have your blood pressure checked again. Monitor your blood pressure at home for 1 week or longer. If you are diagnosed with hypertension, you may have other blood or imaging tests to help your health care provider understand your overall risk for other conditions. How is this treated? This condition is treated by making healthy lifestyle changes, such as eating healthy foods, exercising more, and reducing your alcohol intake. You may be referred for counseling on a healthy diet and physical activity. Your health care provider may prescribe medicine if lifestyle changes are not enough to get your blood pressure under control and if: Your systolic blood pressure is above 130. Your diastolic blood pressure is above 80. Your personal target blood pressure may vary depending on your medical conditions, your age, and other factors. Follow these instructions at home: Eating and drinking  Eat a diet that is high in fiber and potassium, and low in sodium, added sugar, and fat. An example of this eating plan is called the DASH diet. DASH stands for Dietary Approaches to Stop Hypertension. To eat this way: Eat   plenty of fresh fruits and vegetables. Try to fill one half of your plate at each meal with fruits and vegetables. Eat whole grains, such as whole-wheat pasta, brown rice, or whole-grain bread. Fill about one  fourth of your plate with whole grains. Eat or drink low-fat dairy products, such as skim milk or low-fat yogurt. Avoid fatty cuts of meat, processed or cured meats, and poultry with skin. Fill about one fourth of your plate with lean proteins, such as fish, chicken without skin, beans, eggs, or tofu. Avoid pre-made and processed foods. These tend to be higher in sodium, added sugar, and fat. Reduce your daily sodium intake. Many people with hypertension should eat less than 1,500 mg of sodium a day. Do not drink alcohol if: Your health care provider tells you not to drink. You are pregnant, may be pregnant, or are planning to become pregnant. If you drink alcohol: Limit how much you have to: 0-1 drink a day for women. 0-2 drinks a day for men. Know how much alcohol is in your drink. In the [place] one drink equals one 12 oz bottle of beer (355 mL), one 5 oz glass of wine (148 mL), or one 1 oz glass of hard liquor (44 mL). Lifestyle  Work with your health care provider to maintain a healthy body weight or to lose weight. Ask what an ideal weight is for you. Get at least 30 minutes of exercise that causes your heart to beat faster (aerobic exercise) most days of the week. Activities may include walking, swimming, or biking. Include exercise to strengthen your muscles (resistance exercise), such as Pilates or lifting weights, as part of your weekly exercise routine. Try to do these types of exercises for 30 minutes at least 3 days a week. Do not use any products that contain nicotine or tobacco. These products include cigarettes, chewing tobacco, and vaping devices, such as e-cigarettes. If you need help quitting, ask your health care provider. Monitor your blood pressure at home as told by your health care provider. Keep all follow-up visits. This is important. Medicines Take over-the-counter and prescription medicines only as told by your health care provider. Follow directions carefully. Blood  pressure medicines must be taken as prescribed. Do not skip doses of blood pressure medicine. Doing this puts you at risk for problems and can make the medicine less effective. Ask your health care provider about side effects or reactions to medicines that you should watch for. Contact a health care provider if you: Think you are having a reaction to a medicine you are taking. Have headaches that keep coming back (recurring). Feel dizzy. Have swelling in your ankles. Have trouble with your vision. Get help right away if you: Develop a severe headache or confusion. Have unusual weakness or numbness. Feel faint. Have severe pain in your chest or abdomen. Vomit repeatedly. Have trouble breathing. These symptoms may be an emergency. Get help right away. Call 911. Do not wait to see if the symptoms will go away. Do not drive yourself to the hospital. Summary Hypertension is when the force of blood pumping through your arteries is too strong. If this condition is not controlled, it may put you at risk for serious complications. Your personal target blood pressure may vary depending on your medical conditions, your age, and other factors. For most people, a normal blood pressure is less than 120/80. Hypertension is treated with lifestyle changes, medicines, or a combination of both. Lifestyle changes include losing weight, eating a healthy,   low-sodium diet, exercising more, and limiting alcohol. This information is not intended to replace advice given to you by your health care provider. Make sure you discuss any questions you have with your health care provider. Document Revised: 04/11/2021 Document Reviewed: 04/11/2021 Elsevier Patient Education  2023 [employer]  

## 2021-11-30 NOTE — Telephone Encounter (Signed)
The patient reports she is doing well almost 1 year from [facility]. Scheduled her for yearly echo (02/21/2022) and follow up with Dr. Anne Fu (02/27/2022).  The patient was grateful for call and agrees with plan.

## 2021-12-01 LAB — CMP14+EGFR
ALT: 10 IU/L (ref 0–32)
AST: 19 IU/L (ref 0–40)
Albumin/Globulin Ratio: 1.6 (ref 1.2–2.2)
Albumin: 4.4 g/dL (ref 3.6–4.6)
Alkaline Phosphatase: 72 IU/L (ref 44–121)
BUN/Creatinine Ratio: 17 (ref 12–28)
BUN: 12 mg/dL (ref 8–27)
Bilirubin Total: 0.6 mg/dL (ref 0.0–1.2)
CO2: 24 mmol/L (ref 20–29)
Calcium: 9.7 mg/dL (ref 8.7–10.3)
Chloride: 101 mmol/L (ref 96–106)
Creatinine, Ser: 0.72 mg/dL (ref 0.57–1.00)
Globulin, Total: 2.7 g/dL (ref 1.5–4.5)
Glucose: 85 mg/dL (ref 70–99)
Potassium: 4.5 mmol/L (ref 3.5–5.2)
Sodium: 141 mmol/L (ref 134–144)
Total Protein: 7.1 g/dL (ref 6.0–8.5)
eGFR: 83 mL/min/{1.73_m2} (ref >59)

## 2021-12-01 LAB — HEMOGLOBIN A1C
Est. average glucose Bld gHb Est-mCnc: 123 mg/dL
Hgb A1c MFr Bld: 5.9 % — ABNORMAL HIGH (ref 4.8–5.6)

## 2021-12-01 LAB — VITAMIN B12: Vitamin B-12: 743 pg/mL (ref 232–1245)

## 2021-12-06 ENCOUNTER — Ambulatory Visit (INDEPENDENT_AMBULATORY_CARE_PROVIDER_SITE_OTHER): Payer: Medicare Other

## 2021-12-06 ENCOUNTER — Other Ambulatory Visit: Payer: Self-pay

## 2021-12-06 VITALS — Ht 62.0 in | Wt 182.0 lb

## 2021-12-06 DIAGNOSIS — Z0000 Encounter for general adult medical examination without abnormal findings: Secondary | ICD-10-CM

## 2021-12-06 DIAGNOSIS — I1310 Hypertensive heart and chronic kidney disease without heart failure, with stage 1 through stage 4 chronic kidney disease, or unspecified chronic kidney disease: Secondary | ICD-10-CM

## 2021-12-06 NOTE — Patient Instructions (Signed)
Kayla Calhoun , Thank you for taking time to come for your Medicare Wellness Visit. I appreciate your ongoing commitment to your health goals. Please review the following plan we discussed and let me know if I can assist you in the future.   Screening recommendations/referrals: Colonoscopy: not required Mammogram: not required Bone Density: completed 01/28/2019 Recommended yearly ophthalmology/optometry visit for glaucoma screening and checkup Recommended yearly dental visit for hygiene and checkup  Vaccinations: Influenza vaccine: decline Pneumococcal vaccine: completed 11/19/2019 Tdap vaccine: completed 06/30/2012, due 06/30/2022 Shingles vaccine: discussed   Covid-19: 05/22/2021, 04/16/2020, 09/15/2019, 08/20/2019  Advanced directives: Please bring a copy of your POA (Power of Attorney) and/or Living Will to your next appointment.   Conditions/risks identified: none  Next appointment: Follow up in one year for your annual wellness visit    Preventive Care 65 Years and Older, Female Preventive care refers to lifestyle choices and visits with your health care provider that can promote health and wellness. What does preventive care include? A yearly physical exam. This is also called an annual well check. Dental exams once or twice a year. Routine eye exams. Ask your health care provider how often you should have your eyes checked. Personal lifestyle choices, including: Daily care of your teeth and gums. Regular physical activity. Eating a healthy diet. Avoiding tobacco and drug use. Limiting alcohol use. Practicing safe sex. Taking low-dose aspirin every day. Taking vitamin and mineral supplements as recommended by your health care provider. What happens during an annual well check? The services and screenings done by your health care provider during your annual well check will depend on your age, overall health, lifestyle risk factors, and family history of disease. Counseling  Your  health care provider may ask you questions about your: Alcohol use. Tobacco use. Drug use. Emotional well-being. Home and relationship well-being. Sexual activity. Eating habits. History of falls. Memory and ability to understand (cognition). Work and work Astronomer. Reproductive health. Screening  You may have the following tests or measurements: Height, weight, and BMI. Blood pressure. Lipid and cholesterol levels. These may be checked every 5 years, or more frequently if you are over 68 years old. Skin check. Lung cancer screening. You may have this screening every year starting at age 71 if you have a 30-pack-year history of smoking and currently smoke or have quit within the past 15 years. Fecal occult blood test (FOBT) of the stool. You may have this test every year starting at age 58. Flexible sigmoidoscopy or colonoscopy. You may have a sigmoidoscopy every 5 years or a colonoscopy every 10 years starting at age 33. Hepatitis C blood test. Hepatitis B blood test. Sexually transmitted disease (STD) testing. Diabetes screening. This is done by checking your blood sugar (glucose) after you have not eaten for a while (fasting). You may have this done every 1-3 years. Bone density scan. This is done to screen for osteoporosis. You may have this done starting at age 11. Mammogram. This may be done every 1-2 years. Talk to your health care provider about how often you should have regular mammograms. Talk with your health care provider about your test results, treatment options, and if necessary, the need for more tests. Vaccines  Your health care provider may recommend certain vaccines, such as: Influenza vaccine. This is recommended every year. Tetanus, diphtheria, and acellular pertussis (Tdap, Td) vaccine. You may need a Td booster every 10 years. Zoster vaccine. You may need this after age 76. Pneumococcal 13-valent conjugate (PCV13) vaccine. One dose  is recommended after age  65. Pneumococcal polysaccharide (PPSV23) vaccine. One dose is recommended after age 7. Talk to your health care provider about which screenings and vaccines you need and how often you need them. This information is not intended to replace advice given to you by your health care provider. Make sure you discuss any questions you have with your health care provider. Document Released: 07/01/2015 Document Revised: 02/22/2016 Document Reviewed: 04/05/2015 Elsevier Interactive Patient Education  2017 [place] Prevention in the Home Falls can cause injuries. They can happen to people of all ages. There are many things you can do to make your home safe and to help prevent falls. What can I do on the outside of my home? Regularly fix the edges of walkways and driveways and fix any cracks. Remove anything that might make you trip as you walk through a door, such as a raised step or threshold. Trim any bushes or trees on the path to your home. Use bright outdoor lighting. Clear any walking paths of anything that might make someone trip, such as rocks or tools. Regularly check to see if handrails are loose or broken. Make sure that both sides of any steps have handrails. Any raised decks and porches should have guardrails on the edges. Have any leaves, snow, or ice cleared regularly. Use sand or salt on walking paths during winter. Clean up any spills in your garage right away. This includes oil or grease spills. What can I do in the bathroom? Use night lights. Install grab bars by the toilet and in the tub and shower. Do not use towel bars as grab bars. Use non-skid mats or decals in the tub or shower. If you need to sit down in the shower, use a plastic, non-slip stool. Keep the floor dry. Clean up any water that spills on the floor as soon as it happens. Remove soap buildup in the tub or shower regularly. Attach bath mats securely with double-sided non-slip rug tape. Do not have throw  rugs and other things on the floor that can make you trip. What can I do in the bedroom? Use night lights. Make sure that you have a light by your bed that is easy to reach. Do not use any sheets or blankets that are too big for your bed. They should not hang down onto the floor. Have a firm chair that has side arms. You can use this for support while you get dressed. Do not have throw rugs and other things on the floor that can make you trip. What can I do in the kitchen? Clean up any spills right away. Avoid walking on wet floors. Keep items that you use a lot in easy-to-reach places. If you need to reach something above you, use a strong step stool that has a grab bar. Keep electrical cords out of the way. Do not use floor polish or wax that makes floors slippery. If you must use wax, use non-skid floor wax. Do not have throw rugs and other things on the floor that can make you trip. What can I do with my stairs? Do not leave any items on the stairs. Make sure that there are handrails on both sides of the stairs and use them. Fix handrails that are broken or loose. Make sure that handrails are as long as the stairways. Check any carpeting to make sure that it is firmly attached to the stairs. Fix any carpet that is loose or worn. Avoid  having throw rugs at the top or bottom of the stairs. If you do have throw rugs, attach them to the floor with carpet tape. Make sure that you have a light switch at the top of the stairs and the bottom of the stairs. If you do not have them, ask someone to add them for you. What else can I do to help prevent falls? Wear shoes that: Do not have high heels. Have rubber bottoms. Are comfortable and fit you well. Are closed at the toe. Do not wear sandals. If you use a stepladder: Make sure that it is fully opened. Do not climb a closed stepladder. Make sure that both sides of the stepladder are locked into place. Ask someone to hold it for you, if  possible. Clearly mark and make sure that you can see: Any grab bars or handrails. First and last steps. Where the edge of each step is. Use tools that help you move around (mobility aids) if they are needed. These include: Canes. Walkers. Scooters. Crutches. Turn on the lights when you go into a dark area. Replace any light bulbs as soon as they burn out. Set up your furniture so you have a clear path. Avoid moving your furniture around. If any of your floors are uneven, fix them. If there are any pets around you, be aware of where they are. Review your medicines with your doctor. Some medicines can make you feel dizzy. This can increase your chance of falling. Ask your doctor what other things that you can do to help prevent falls. This information is not intended to replace advice given to you by your health care provider. Make sure you discuss any questions you have with your health care provider. Document Released: 03/31/2009 Document Revised: 11/10/2015 Document Reviewed: 07/09/2014 Elsevier Interactive Patient Education  2017 [employer].

## 2021-12-06 NOTE — Progress Notes (Signed)
I connected with Bertram [name] today by telephone and verified that I am speaking with the correct person using two identifiers. Location patient: home Location provider: work Persons participating in the virtual visit: Leita, [place] LPN.   I discussed the limitations, risks, security and privacy concerns of performing an evaluation and management service by telephone and the availability of in person appointments. I also discussed with the patient that there may be a patient responsible charge related to this service. The patient expressed understanding and verbally consented to this telephonic visit.    Interactive audio and video telecommunications were attempted between this provider and patient, however failed, due to patient having technical difficulties OR patient did not have access to video capability.  We continued and completed visit with audio only.     Vital signs may be patient reported or missing.  Subjective:   Kayla Calhoun is a 81 y.o. female who presents for Medicare Annual (Subsequent) preventive examination.  Review of Systems     Cardiac Risk Factors include: advanced age (>36men, >78 women);hypertension;obesity (BMI >30kg/m2)     Objective:    Today's Vitals   12/06/21 1126  Weight: 182 lb (82.6 kg)  Height: 5\' 2"  (1.575 m)   Body mass index is 33.29 kg/m.     12/06/2021   11:30 AM 02/01/2021    9:55 AM 12/22/2020    8:03 AM 11/16/2020   12:14 PM 10/28/2019    9:48 AM 10/22/2018    9:37 AM  Advanced Directives  Does Patient Have a Medical Advance Directive? Yes Yes No Yes Yes Yes  Type of 12/22/2018 of [place];Living will Healthcare Power of [place];Living will  Healthcare Power of [place];Living will Healthcare Power of [place];Living will Healthcare Power of [place];Living will  Copy of Healthcare Power of Attorney in Chart? No - copy requested Yes - validated most recent copy scanned in chart (See row  information)  No - copy requested No - copy requested No - copy requested  Would patient like information on creating a medical advance directive?   No - Patient declined       Current Medications (verified) Outpatient Encounter Medications as of 12/06/2021  Medication Sig   acetaminophen (TYLENOL) 500 MG tablet Take 500 mg by mouth every 6 (six) hours as needed for mild pain or moderate pain.   ALPHAGAN P 0.1 % SOLN Place 1 drop into both eyes in the morning and at bedtime.   amLODipine (NORVASC) 5 MG tablet Take 1 tablet (5 mg total) by mouth daily.   Ascorbic Acid (VITAMIN C) 1000 MG tablet Take 1,000 mg by mouth every other day. In the morning   ASPIRIN LOW DOSE 81 MG EC tablet TAKE 1 TABLET (81 MG TOTAL) BY MOUTH DAILY. SWALLOW WHOLE.   cetirizine (ZYRTEC) 10 MG tablet Take 5 mg by mouth daily as needed for allergies.   Cholecalciferol (VITAMIN D3) 50 MCG (2000 UT) TABS Take 2,000 Units by mouth daily.   levobunolol (BETAGAN) 0.5 % ophthalmic solution Place 1 drop into both eyes 2 (two) times daily.   metoprolol succinate (TOPROL-XL) 50 MG 24 hr tablet TAKE 1 TABLET BY MOUTH DAILY. TAKE WITH OR IMMEDIATELY FOLLOWING A MEAL.   Noni, [place] citrifolia, (NONI JUICE PO) Take 60 mLs by mouth every other day.   TRAVATAN Z 0.004 % SOLN ophthalmic solution Place 1 drop into both eyes at bedtime.   triamterene-hydrochlorothiazide (MAXZIDE-25) 37.5-25 MG tablet TAKE 1/2 TABLET BY MOUTH EVERY DAY  vitamin E 180 MG (400 UNITS) capsule Take 400 Units by mouth 2 (two) times a week.   No facility-administered encounter medications on file as of 12/06/2021.    Allergies (verified) Codeine, Sulfa antibiotics, and Contrast media [iodinated contrast media]   History: Past Medical History:  Diagnosis Date   Atrial fibrillation ([place])    Glaucoma    Hypertension    Vitamin D deficiency    Past Surgical History:  Procedure Laterality Date   ABDOMINAL HYSTERECTOMY     KIDNEY SURGERY     LEFT  ATRIAL APPENDAGE OCCLUSION N/A 12/22/2020   Procedure: LEFT ATRIAL APPENDAGE OCCLUSION;  Surgeon: Vickie Epley, MD;  Location: [facility];  Service: Cardiovascular;  Laterality: N/A;   TEE WITHOUT CARDIOVERSION N/A 12/22/2020   Procedure: TRANSESOPHAGEAL ECHOCARDIOGRAM (TEE);  Surgeon: Vickie Epley, MD;  Location: [facility];  Service: Cardiovascular;  Laterality: N/A;   TEE WITHOUT CARDIOVERSION N/A 02/01/2021   Procedure: TRANSESOPHAGEAL ECHOCARDIOGRAM (TEE);  Surgeon: Sanda Klein, MD;  Location: [facility];  Service: Cardiovascular;  Laterality: N/A;   TONSILLECTOMY     Family History  Problem Relation Age of Onset   Stroke Mother    Prostate cancer Father    Stroke Sister    Hypertension Sister    Hypertension Brother    Stroke Brother    Social History   Socioeconomic History   Marital status: Widowed    Spouse name: Not on file   Number of children: 1   Years of education: Not on file   Highest education level: Not on file  Occupational History   Occupation: retired  Tobacco Use   Smoking status: Former    Years: 10.00    Types: Cigarettes   Smokeless tobacco: Never   Tobacco comments:    been quit 35 years  Vaping Use   Vaping Use: Never used  Substance and Sexual Activity   Alcohol use: Not Currently   Drug use: Never   Sexual activity: Not Currently  Other Topics Concern   Not on file  Social History Narrative   Not on file   Social Determinants of Health   Financial Resource Strain: Low Risk  (12/06/2021)   Overall Financial Resource Strain (CARDIA)    Difficulty of Paying Living Expenses: Not hard at all  Food Insecurity: No Food Insecurity (12/06/2021)   Hunger Vital Sign    Worried About Running Out of Food in the Last Year: Never true    [place] in the Last Year: Never true  Transportation Needs: No Transportation Needs (12/06/2021)   PRAPARE - Hydrologist (Medical): No    Lack of  Transportation (Non-Medical): No  Physical Activity: Inactive (12/06/2021)   Exercise Vital Sign    Days of Exercise per Week: 0 days    Minutes of Exercise per Session: 0 min  Stress: No Stress Concern Present (12/06/2021)   [place]    Feeling of Stress : Not at all  Social Connections: Not on file    Tobacco Counseling Counseling given: Not Answered Tobacco comments: been quit 35 years   Clinical Intake:  Pre-visit preparation completed: Yes  Pain : No/denies pain     Nutritional Status: BMI > 30  Obese Nutritional Risks: None Diabetes: No  How often do you need to have someone help you when you read instructions, pamphlets, or other written materials from your doctor or  pharmacy?: 1 - Never  Diabetic? no  Interpreter Needed?: No  Information entered by :: NAllen LPN   Activities of Daily Living    12/06/2021   11:31 AM 12/22/2020    8:15 AM  In your present state of health, do you have any difficulty performing the following activities:  Hearing? 0   Comment wears hearing aids   Vision? 0   Difficulty concentrating or making decisions? 0   Walking or climbing stairs? 0   Dressing or bathing? 0   Doing errands, shopping? 0 0  Preparing Food and eating ? N   Using the Toilet? N   In the past six months, have you accidently leaked urine? N   Comment with a sneeze on occasion   Do you have problems with loss of bowel control? N   Managing your Medications? N   Managing your Finances? N   Housekeeping or managing your Housekeeping? N     Patient Care Team: [place] Chard, MD as PCP - General (Internal Medicine) Vickie Epley, MD as PCP - Electrophysiology (Cardiology)  Indicate any recent Medical Services you may have received from other than [facility] providers in the past year (date may be approximate).     Assessment:   This is a routine wellness examination for  Jimesha.  Hearing/Vision screen Vision Screening - Comments:: Regular eye exams, Dr. Satira Sark  Dietary issues and exercise activities discussed: Current Exercise Habits: The patient does not participate in regular exercise at present   Goals Addressed             This Visit's Progress    Patient Stated       12/06/2021, wants to lose 7 pounds       Depression Screen    12/06/2021   11:31 AM 11/30/2021   11:10 AM 11/16/2020   12:15 PM 11/02/2020    8:54 AM 10/28/2019    9:48 AM 10/28/2019    9:30 AM 10/22/2018    9:38 AM  PHQ 2/9 Scores  PHQ - 2 Score 0 0 0 0 0 0 0  PHQ- 9 Score     0  0    Fall Risk    12/06/2021   11:30 AM 11/30/2021   11:11 AM 11/16/2020   12:15 PM 11/02/2020    8:46 AM 10/28/2019    9:48 AM  Fall Risk   Falls in the past year? 0 0 0 0 0  Number falls in past yr: 0 0     Injury with Fall? 0 0     Risk for fall due to : Medication side effect No Fall Risks Medication side effect  Medication side effect  Follow up Falls evaluation completed;Education provided;Falls prevention discussed Falls evaluation completed Falls evaluation completed;Education provided;Falls prevention discussed      FALL RISK PREVENTION PERTAINING TO THE HOME:  Any stairs in or around the home? Yes  If so, are there any without handrails? No  Home free of loose throw rugs in walkways, pet beds, electrical cords, etc? Yes  Adequate lighting in your home to reduce risk of falls? Yes   ASSISTIVE DEVICES UTILIZED TO PREVENT FALLS:  Life alert? No  Use of a cane, walker or w/c? No  Grab bars in the bathroom? Yes  Shower chair or bench in shower? No  Elevated toilet seat or a handicapped toilet? No   TIMED UP AND GO:  Was the test performed? No .      Cognitive Function:  12/06/2021   11:32 AM 11/16/2020   12:18 PM 10/28/2019    9:52 AM 10/22/2018    9:43 AM  6CIT Screen  What Year? 0 points 0 points 0 points 0 points  What month? 0 points 0 points 0 points 0 points   What time? 3 points 0 points 0 points 0 points  Count back from 20 0 points 0 points 0 points 0 points  Months in reverse 0 points 0 points 0 points 0 points  Repeat phrase 0 points 0 points 0 points 0 points  Total Score 3 points 0 points 0 points 0 points    Immunizations Immunization History  Administered Date(s) Administered   PFIZER(Purple Top)SARS-COV-2 Vaccination 08/20/2019, 09/15/2019, 04/16/2020   Pfizer Covid-19 Vaccine Bivalent Booster 54yrs & up 05/22/2021   Pneumococcal Conjugate-13 11/19/2019   Pneumococcal Polysaccharide-23 03/27/2018    TDAP status: Up to date  Flu Vaccine status: Declined, Education has been provided regarding the importance of this vaccine but patient still declined. Advised may receive this vaccine at local pharmacy or Health Dept. Aware to provide a copy of the vaccination record if obtained from local pharmacy or Health Dept. Verbalized acceptance and understanding.  Pneumococcal vaccine status: Up to date  Covid-19 vaccine status: Completed vaccines  Qualifies for Shingles Vaccine? Yes   Zostavax completed No   Shingrix Completed?: No.    Education has been provided regarding the importance of this vaccine. Patient has been advised to call insurance company to determine out of pocket expense if they have not yet received this vaccine. Advised may also receive vaccine at local pharmacy or Health Dept. Verbalized acceptance and understanding.  Screening Tests Health Maintenance  Topic Date Due   Zoster Vaccines- Shingrix (1 of 2) Never done   INFLUENZA VACCINE  01/16/2022   TETANUS/TDAP  06/30/2022   Pneumonia Vaccine 46+ Years old  Completed   DEXA SCAN  Completed   COVID-19 Vaccine  Completed   HPV VACCINES  Aged Out    Health Maintenance  Health Maintenance Due  Topic Date Due   Zoster Vaccines- Shingrix (1 of 2) Never done    Colorectal cancer screening: No longer required.   Mammogram status: No longer required due to  age.  Bone Density status: Completed 01/28/2019.   Lung Cancer Screening: (Low Dose CT Chest recommended if Age 60-80 years, 30 pack-year currently smoking OR have quit w/in 15years.) does not qualify.   Lung Cancer Screening Referral: no  Additional Screening:  Hepatitis C Screening: does not qualify;  Vision Screening: Recommended annual ophthalmology exams for early detection of glaucoma and other disorders of the eye. Is the patient up to date with their annual eye exam?  Yes  Who is the provider or what is the name of the office in which the patient attends annual eye exams? Dr. Burgess Estelle If pt is not established with a provider, would they like to be referred to a provider to establish care? No .   Dental Screening: Recommended annual dental exams for proper oral hygiene  Community Resource Referral / Chronic Care Management: CRR required this visit?  No   CCM required this visit?  No      Plan:     I have personally reviewed and noted the following in the patient's chart:   Medical and social history Use of alcohol, tobacco or illicit drugs  Current medications and supplements including opioid prescriptions.  Functional ability and status Nutritional status Physical activity Advanced directives List of other  physicians Hospitalizations, surgeries, and ER visits in previous 12 months Vitals Screenings to include cognitive, depression, and falls Referrals and appointments  In addition, I have reviewed and discussed with patient certain preventive protocols, quality metrics, and best practice recommendations. A written personalized care plan for preventive services as well as general preventive health recommendations were provided to patient.     Kellie Simmering, LPN   [id number]   Nurse Notes: none  Due to this being a virtual visit, the after visit summary with patients personalized plan was offered to patient via mail or my-chart.  patient was mailed a copy of  AVS.

## 2021-12-07 LAB — MICROALBUMIN / CREATININE URINE RATIO
Creatinine, Urine: 119.2 mg/dL
Microalb/Creat Ratio: 14 mg/g creat (ref 0–29)
Microalbumin, Urine: 16.4 ug/mL

## 2022-01-14 ENCOUNTER — Other Ambulatory Visit: Payer: Self-pay | Admitting: Cardiology

## 2022-02-21 ENCOUNTER — Ambulatory Visit (HOSPITAL_COMMUNITY): Payer: Medicare Other | Attending: Cardiology

## 2022-02-21 DIAGNOSIS — I340 Nonrheumatic mitral (valve) insufficiency: Secondary | ICD-10-CM | POA: Insufficient documentation

## 2022-02-21 LAB — ECHOCARDIOGRAM COMPLETE
MV M vel: 4.6 m/s
MV Peak grad: 84.6 mmHg
P 1/2 time: 601 msec
Radius: 0.6 cm
S' Lateral: 2.5 cm

## 2022-02-27 ENCOUNTER — Ambulatory Visit: Payer: Medicare Other | Attending: Cardiology | Admitting: Cardiology

## 2022-02-27 ENCOUNTER — Encounter: Payer: Self-pay | Admitting: Cardiology

## 2022-02-27 VITALS — BP 120/80 | HR 88 | Ht 62.0 in | Wt 185.0 lb

## 2022-02-27 DIAGNOSIS — I4821 Permanent atrial fibrillation: Secondary | ICD-10-CM | POA: Insufficient documentation

## 2022-02-27 DIAGNOSIS — I609 Nontraumatic subarachnoid hemorrhage, unspecified: Secondary | ICD-10-CM | POA: Diagnosis present

## 2022-02-27 DIAGNOSIS — Z95818 Presence of other cardiac implants and grafts: Secondary | ICD-10-CM | POA: Diagnosis present

## 2022-02-27 DIAGNOSIS — I340 Nonrheumatic mitral (valve) insufficiency: Secondary | ICD-10-CM | POA: Insufficient documentation

## 2022-02-27 NOTE — Progress Notes (Signed)
Cardiology Office Note:    Date:  02/27/2022   ID:  Kayla Calhoun, DOB 1941/01/23, MRN [id number]  PCP:  Glendale Chard, MD   [facility] Providers Cardiologist:  Candee Furbish, MD Electrophysiologist:  Vickie Epley, MD     Referring MD: Glendale Chard, MD    History of Present Illness:    Kayla Calhoun is a 81 y.o. female here for follow up and discussion of Echo results, moderate to severe mitral regurgitation eccentric jet.  She had an Echo 02/21/2022 which showed ejection fraction normal.  Normal pump function.  Moderate to severe mitral regurgitation.  Prior TEE in 2022 showed moderate mitral regurgitation.  We will repeat echocardiogram in 1 year or sooner if symptoms develop such as worsening shortness of breath.  She followed up with Dr. Quentin Ore 07/14/2021 where she was doing well. Her Plavix was discontinued and she was transitioned to 81 mg ASA.  Previously here for follow-up of atrial fibrillation. On 12/22/2020 watchman device placed.  Follow-up TEE on 02/01/2021 showed normal watchman.  Has moderate central mitral regurgitation.  At previous visit on 03/03/2021 she reported being off of anticoagulation and reported taking aspirin and Plavix.  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 She stated that overall she is doing well post watchman.  In January when she sees Dr. Quentin Ore again, she may be able to come off of the Plavix after 6 months of therapy.  Today she reports she has been doing great at home. She has not been having any issues with weakness or difficultly breathing.  We reviewed the results of her recent Echo at length.   She denies any palpitations, chest pain, shortness of breath. No lightheadedness, headaches, syncope, orthopnea, or PND.     Past Medical History:  Diagnosis Date   Atrial fibrillation ([place])    Glaucoma    Hypertension    Vitamin D deficiency     Past  Surgical History:  Procedure Laterality Date   ABDOMINAL HYSTERECTOMY     KIDNEY SURGERY     LEFT ATRIAL APPENDAGE OCCLUSION N/A 12/22/2020   Procedure: LEFT ATRIAL APPENDAGE OCCLUSION;  Surgeon: Vickie Epley, MD;  Location: [facility];  Service: Cardiovascular;  Laterality: N/A;   TEE WITHOUT CARDIOVERSION N/A 12/22/2020   Procedure: TRANSESOPHAGEAL ECHOCARDIOGRAM (TEE);  Surgeon: Vickie Epley, MD;  Location: [place] [facility];  Service: Cardiovascular;  Laterality: N/A;   TEE WITHOUT CARDIOVERSION N/A 02/01/2021   Procedure: TRANSESOPHAGEAL ECHOCARDIOGRAM (TEE);  Surgeon: Sanda Klein, MD;  Location: [facility];  Service: Cardiovascular;  Laterality: N/A;   TONSILLECTOMY      Current Medications: Current Meds  Medication Sig   acetaminophen (TYLENOL) 500 MG tablet Take 500 mg by mouth every 6 (six) hours as needed for mild pain or moderate pain.   ALPHAGAN P 0.1 % SOLN Place 1 drop into both eyes in the morning and at bedtime.   amLODipine (NORVASC) 5 MG tablet Take 1 tablet (5 mg total) by mouth daily.   Ascorbic Acid (VITAMIN C) 1000 MG tablet Take 1,000 mg by mouth every other day. In the morning   ASPIRIN LOW DOSE 81 MG EC tablet TAKE 1 TABLET (81 MG TOTAL) BY MOUTH DAILY. SWALLOW WHOLE.   cetirizine (ZYRTEC) 10 MG tablet Take 5 mg by mouth daily as needed for allergies.   Cholecalciferol (VITAMIN D3) 50 MCG (2000 UT) TABS Take 2,000 Units by mouth  daily.   levobunolol (BETAGAN) 0.5 % ophthalmic solution Place 1 drop into both eyes 2 (two) times daily.   metoprolol succinate (TOPROL-XL) 50 MG 24 hr tablet TAKE 1 TABLET BY MOUTH EVERY DAY WITH OR IMMEDIATELY FOLLOWING A MEAL   Noni, [place]inda citrifolia, (NONI JUICE PO) Take 60 mLs by mouth every other day.   TRAVATAN Z 0.004 % SOLN ophthalmic solution Place 1 drop into both eyes at bedtime.   triamterene-hydrochlorothiazide (MAXZIDE-25) 37.5-25 MG tablet TAKE 1/2 TABLET BY MOUTH EVERY DAY   vitamin E 180 MG (400  UNITS) capsule Take 400 Units by mouth 2 (two) times a week.     Allergies:   Codeine, Sulfa antibiotics, and Contrast media [iodinated contrast media]   Social History   Socioeconomic History   Marital status: Widowed    Spouse name: Not on file   Number of children: 1   Years of education: Not on file   Highest education level: Not on file  Occupational History   Occupation: retired  Tobacco Use   Smoking status: Former    Years: 10.00    Types: Cigarettes   Smokeless tobacco: Never   Tobacco comments:    been quit 35 years  Vaping Use   Vaping Use: Never used  Substance and Sexual Activity   Alcohol use: Not Currently   Drug use: Never   Sexual activity: Not Currently  Other Topics Concern   Not on file  Social History Narrative   Not on file   Social Determinants of Health   Financial Resource Strain: Low Risk  (12/06/2021)   Overall Financial Resource Strain (CARDIA)    Difficulty of Paying Living Expenses: Not hard at all  Food Insecurity: No Food Insecurity (12/06/2021)   Hunger Vital Sign    Worried About Running Out of Food in the Last Year: Never true    Ran Out of Food in the Last Year: Never true  Transportation Needs: No Transportation Needs (12/06/2021)   PRAPARE - Administrator, Civil Service (Medical): No    Lack of Transportation (Non-Medical): No  Physical Activity: Inactive (12/06/2021)   Exercise Vital Sign    Days of Exercise per Week: 0 days    Minutes of Exercise per Session: 0 min  Stress: No Stress Concern Present (12/06/2021)   [employer] of Occupational Health - Occupational Stress Questionnaire    Feeling of Stress : Not at all  Social Connections: Not on file     Family History: The patient's family history includes Hypertension in her brother and sister; Prostate cancer in her father; Stroke in her brother, mother, and sister.  ROS:   Please see the history of present illness.      All other systems reviewed and  are negative.  EKGs/Labs/Other Studies Reviewed:    The following studies were reviewed today:  Echo 02/21/2022:  IMPRESSIONS    1. Left ventricular ejection fraction, by estimation, is 60 to 65%. The  left ventricle has normal function. The left ventricle has no regional  wall motion abnormalities. There is mild left ventricular hypertrophy of  the basal-septal segment. Left  ventricular diastolic function could not be evaluated.   2. Right ventricular systolic function is normal. The right ventricular  size is normal. There is moderately elevated pulmonary artery systolic  pressure. The estimated right ventricular systolic pressure is 45.9 mmHg.   3. Left atrial size was severely dilated.   4. The mitral valve is degenerative. Moderate to severe  mitral valve  regurgitation. No evidence of mitral stenosis. The MR EROA is 0.18cm2 c/w  mild MR but this is underestimated in the setting of very eccentric jet.  The regurgitatant volume is 30cc c/w  moderate MR. Overall there appers to be at least moderate MR but given  eccentricity of jet this may underestimate the true severity. Consider TEE  for further evaluation if clinically indicated.   5. The aortic valve is tricuspid. Aortic valve regurgitation is mild.  Aortic valve sclerosis/calcification is present, without any evidence of  aortic stenosis. Aortic regurgitation PHT measures 601 msec.   6. The inferior vena cava is dilated in size with <50% respiratory  variability, suggesting right atrial pressure of 15 mmHg.  TEE 02/01/2021: IMPRESSIONS    1. Left ventricular ejection fraction, by estimation, is 60 to 65%. The  left ventricle has normal function. The left ventricle has no regional  wall motion abnormalities. Left ventricular diastolic function could not  be evaluated.   2. Right ventricular systolic function is normal. The right ventricular  size is normal. There is mildly elevated pulmonary artery systolic  pressure. The  estimated right ventricular systolic pressure is 99991111 mmHg.   3. Well seated Watchman FLX device without thrombus or leak. Left atrial  size was severely dilated. No left atrial/left atrial appendage thrombus  was detected.   4. Right atrial size was moderately dilated.   5. The mitral valve is abnormal. Moderate mitral valve regurgitation.   6. Tricuspid valve regurgitation is moderate.   7. The aortic valve is tricuspid. Aortic valve regurgitation is trivial.  Mild aortic valve sclerosis is present, with no evidence of aortic valve  stenosis.   8. There is Moderate (Grade III) protruding plaque involving the  transverse aorta.   LAAO  12/22/2020: CONCLUSIONS:  1.Successful implantation of a WATCHMAN left atrial appendage occlusive device    2. TEE demonstrating no LAA thrombus 3. No early apparent complications.   CT Cardiac 10/13/2020: IMPRESSION: 1. 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   2. A 24 mm Watchman FLX device is recommended based on the above landing zone measurements (20.8 mm maximum diameter; 13% compression).   3. There is no thrombus in the left atrial appendage on delayed imaging.   4. A mid posterior IAS puncture site is recommended.   5. Optimal deployment angle: RAO 17 CAU 3   6. Normal coronary origin. Right dominance. CAC score of 692, which is 90th percentile for age- and sex-matched controls.   7. Biatrial enlargement noted.    EKG:  EKG is personally reviewed.  02/27/2022:afib well controlled   Recent Labs: 03/03/2021: Hemoglobin 11.6; Platelets 264 05/09/2021: TSH 1.560 11/30/2021: ALT 10; BUN 12; Creatinine, Ser 0.72; Potassium 4.5; Sodium 141  Recent Lipid Panel    Component Value Date/Time   CHOL 167 11/02/2020 1136   TRIG 60 11/02/2020 1136   HDL 56 11/02/2020 1136   CHOLHDL 3.0 11/02/2020 1136   LDLCALC 99 11/02/2020 1136     Risk Assessment/Calculations:          Physical Exam:    VS:  BP 120/80  (BP Location: Left Arm, Patient Position: Sitting, Cuff Size: Large)   Pulse 88   Ht 5\' 2"  (1.575 m)   Wt 185 lb (83.9 kg)   BMI 33.84 kg/m     Wt Readings from Last 3 Encounters:  02/27/22 185 lb (83.9 kg)  12/06/21 182 lb (82.6 kg)  11/30/21 182 lb (  82.6 kg)     GEN:  Well nourished, well developed in no acute distress HEENT: Normal NECK: No JVD; No carotid bruits LYMPHATICS: No lymphadenopathy CARDIAC: Irregularly irregular rate, 1/6 soft holosystolic murmur, no rubs, gallops RESPIRATORY:  Clear to auscultation without rales, wheezing or rhonchi  ABDOMEN: Soft, non-tender, non-distended MUSCULOSKELETAL:  Chronic LE edema; No deformity  SKIN: Warm and dry NEUROLOGIC:  Alert and oriented x 3 PSYCHIATRIC:  Normal affect   ASSESSMENT:    1. Mitral valve insufficiency, unspecified etiology   2. Permanent atrial fibrillation (HCC)   3. Presence of Watchman left atrial appendage closure device   4. Subarachnoid bleed (HCC)     PLAN:    In order of problems listed above:  Atrial fibrillation [facility]) Doing well post watchman device July 2022.,  Dr. Lalla Brothers.  Doing well no bleeding no strokelike symptoms.  She is off of her Eliquis.  Currently on aspirin only.  Atrial fibrillation is under good control.    Presence of Watchman left atrial appendage closure device As described above, Dr. Lalla Brothers.  July 2022.  Excellent results.  Postop the reassuring.  No changes made.   HTN (hypertension) Well-controlled.  120/80.  Continue with Maxide, amlodipine 5 mg daily.  No changes in medical management.  Excellent.   History of subarachnoid hemorrhage Remote history.  No further sequelae.  Off of anticoagulation.  Watchman.   Mitral regurgitation Moderate central mitral regurgitation previously noted on TEE last year in July 2022 surrounding her Watchman device.  Most recent echocardiogram showed moderate to severe given the eccentricity of the jet.  Since she is not having any  symptoms currently, I will repeat echocardiogram in 6 months to monitor severity.  If necessary, we can always repeat TEE but since she just had one in July, I will await results of transthoracic.  We discussed structural team if necessary in the future.  No shortness of breath currently.     Follow up: 6 months after repeat echocardiogram  Medication Adjustments/Labs and Tests Ordered: Current medicines are reviewed at length with the patient today.  Concerns regarding medicines are outlined above.  Orders Placed This Encounter  Procedures   EKG 12-Lead   ECHOCARDIOGRAM COMPLETE   No orders of the defined types were placed in this encounter.   Patient Instructions  Medication Instructions:  Your physician recommends that you continue on your current medications as directed. Please refer to the Current Medication list given to you today.  *If you need a refill on your cardiac medications before your next appointment, please call your pharmacy*  Testing/Procedures: ECHO Your physician has requested that you have an echocardiogram. Echocardiography is a painless test that uses sound waves to create images of your heart. It provides your doctor with information about the size and shape of your heart and how well your heart's chambers and valves are working. This procedure takes approximately one hour. There are no restrictions for this procedure.\    Follow-Up: At [facility], you and your health needs are our priority.  As part of our continuing mission to provide you with exceptional heart care, we have created designated Provider Care Teams.  These Care Teams include your primary Cardiologist (physician) and Advanced Practice Providers (APPs -  Physician Assistants and Nurse Practitioners) who all work together to provide you with the care you need, when you need it.   Your next appointment:   6 month(s)  The format for your next appointment:   In Person  Provider:    Candee Furbish, MD   after ECHO in 6 mths            I,Jessica Ford,acting as a scribe for Candee Furbish, MD.,have documented all relevant documentation on the behalf of Candee Furbish, MD,as directed by  Candee Furbish, MD while in the presence of Candee Furbish, MD.   I, Candee Furbish, MD, have reviewed all documentation for this visit. The documentation on 02/27/22 for the exam, diagnosis, procedures, and orders are all accurate and complete.   Signed, Candee Furbish, MD  02/27/2022 9:42 AM    [facility]

## 2022-02-27 NOTE — Patient Instructions (Signed)
Medication Instructions:  Your physician recommends that you continue on your current medications as directed. Please refer to the Current Medication list given to you today.  *If you need a refill on your cardiac medications before your next appointment, please call your pharmacy*  Testing/Procedures: ECHO Your physician has requested that you have an echocardiogram. Echocardiography is a painless test that uses sound waves to create images of your heart. It provides your doctor with information about the size and shape of your heart and how well your heart's chambers and valves are working. This procedure takes approximately one hour. There are no restrictions for this procedure.\    Follow-Up: At [facility], you and your health needs are our priority.  As part of our continuing mission to provide you with exceptional heart care, we have created designated Provider Care Teams.  These Care Teams include your primary Cardiologist (physician) and Advanced Practice Providers (APPs -  Physician Assistants and Nurse Practitioners) who all work together to provide you with the care you need, when you need it.   Your next appointment:   6 month(s)  The format for your next appointment:   In Person  Provider:   Donato Schultz, MD   after ECHO in 6 mths

## 2022-03-06 LAB — HM DIABETES EYE EXAM

## 2022-04-11 ENCOUNTER — Other Ambulatory Visit: Payer: Self-pay | Admitting: Cardiology

## 2022-04-19 ENCOUNTER — Other Ambulatory Visit: Payer: Self-pay | Admitting: Cardiology

## 2022-05-01 ENCOUNTER — Ambulatory Visit: Payer: Self-pay

## 2022-05-08 ENCOUNTER — Other Ambulatory Visit (INDEPENDENT_AMBULATORY_CARE_PROVIDER_SITE_OTHER): Payer: Medicare Other

## 2022-05-08 DIAGNOSIS — N182 Chronic kidney disease, stage 2 (mild): Secondary | ICD-10-CM | POA: Diagnosis not present

## 2022-05-08 DIAGNOSIS — I1310 Hypertensive heart and chronic kidney disease without heart failure, with stage 1 through stage 4 chronic kidney disease, or unspecified chronic kidney disease: Secondary | ICD-10-CM

## 2022-05-08 LAB — POCT URINALYSIS DIPSTICK
Bilirubin, UA: NEGATIVE
Blood, UA: NEGATIVE
Glucose, UA: NEGATIVE
Ketones, UA: NEGATIVE
Leukocytes, UA: NEGATIVE
Nitrite, UA: NEGATIVE
Protein, UA: NEGATIVE
Spec Grav, UA: 1.015 (ref 1.010–1.025)
Urobilinogen, UA: 0.2 E.U./dL
pH, UA: 7 (ref 5.0–8.0)

## 2022-05-31 ENCOUNTER — Encounter: Payer: Self-pay | Admitting: Internal Medicine

## 2022-05-31 ENCOUNTER — Ambulatory Visit (INDEPENDENT_AMBULATORY_CARE_PROVIDER_SITE_OTHER): Payer: Medicare Other | Admitting: Internal Medicine

## 2022-05-31 VITALS — BP 132/84 | HR 97 | Temp 98.7°F | Ht 62.0 in | Wt 183.0 lb

## 2022-05-31 DIAGNOSIS — I2510 Atherosclerotic heart disease of native coronary artery without angina pectoris: Secondary | ICD-10-CM

## 2022-05-31 DIAGNOSIS — I1310 Hypertensive heart and chronic kidney disease without heart failure, with stage 1 through stage 4 chronic kidney disease, or unspecified chronic kidney disease: Secondary | ICD-10-CM

## 2022-05-31 DIAGNOSIS — I2583 Coronary atherosclerosis due to lipid rich plaque: Secondary | ICD-10-CM | POA: Diagnosis not present

## 2022-05-31 DIAGNOSIS — Z6833 Body mass index (BMI) 33.0-33.9, adult: Secondary | ICD-10-CM

## 2022-05-31 DIAGNOSIS — R7309 Other abnormal glucose: Secondary | ICD-10-CM

## 2022-05-31 DIAGNOSIS — I4811 Longstanding persistent atrial fibrillation: Secondary | ICD-10-CM | POA: Diagnosis not present

## 2022-05-31 DIAGNOSIS — N182 Chronic kidney disease, stage 2 (mild): Secondary | ICD-10-CM

## 2022-05-31 DIAGNOSIS — E6609 Other obesity due to excess calories: Secondary | ICD-10-CM

## 2022-05-31 NOTE — Patient Instructions (Addendum)
Atherosclerosis  Atherosclerosis is when plaque builds up in the arteries. This causes narrowing and hardening of the arteries. Arteries are blood vessels that carry blood from the heart to all parts of the body. This blood contains oxygen. Plaque occurs due to inflammation or from a buildup of fat, cholesterol, calcium, waste products of cells, and a clotting material in the blood (fibrin). Plaque decreases the amount of blood that can flow through the artery. Atherosclerosis can affect any artery in your body, including: Heart arteries. Damage to these arteries may lead to coronary artery disease, which can cause a heart attack. Brain arteries. Damage to these arteries may cause a stroke. Leg, arm, and pelvis arteries. Peripheral artery disease (PAD) may result from damage to these arteries. Kidney arteries. Kidney (renal) failure may result from damage to kidney arteries. Treatment may slow the disease and prevent further damage to your heart, brain, peripheral arteries, and kidneys. What are the causes? This condition develops slowly over many years. The inner layers of your arteries become damaged and allow the gradual buildup of plaque. The exact cause of atherosclerosis is not fully understood. Symptoms of atherosclerosis do not occur until an artery becomes narrow or blocked. What increases the risk? The following factors may make you more likely to develop this condition: Being middle-aged or older. Certain medical conditions, including: High blood pressure. High cholesterol. High blood fats (triglycerides). Diabetes. Sleep apnea. Obesity. Certain lab levels, including: Elevated C-reactive protein (CRP). This is a sign of increased inflammation in your body. Elevated homocysteine levels. This is an amino acid that is associated with heart and blood vessel disease. Using tobacco or nicotine products. A family history of atherosclerosis. Not exercising enough (sedentary  lifestyle). Being stressed. Drinking too much alcohol or using drugs, such as cocaine or methamphetamine. What are the signs or symptoms? Symptoms of atherosclerosis do not occur until the plaque severely narrows or blocks the artery, which decreases blood flow. Sometimes, atherosclerosis does not cause symptoms. Symptoms of this condition include: Coronary artery disease. This may cause chest pain and shortness of breath. Decreased blood supply to your brain, which may cause a stroke. Signs of a stroke may include sudden: Weakness or numbness in your face, arm, or leg, especially on one side of your body. Trouble walking or difficulty moving your arms or legs. Loss of balance or coordination. Confusion. Slurred speech. Trouble speaking, or trouble understanding speech, or both (aphasia). Vision changes in one or both eyes. This may be double vision, blurred vision, or loss of vision. Severe headache with no known cause. The headache is often described as the worst headache ever experienced. PAD, which may cause pain, numbness, or nonhealing wounds, often in your legs and hips. Renal failure. This may cause tiredness, problems with urination, swelling, and itchy skin. How is this diagnosed? This condition is diagnosed based on your medical history and a physical exam. During the exam, your health care provider will: Check your pulse in different places. Listen for a "whooshing" sound over your arteries (bruit). You may also have tests, such as: Blood tests to check your levels of cholesterol, triglycerides, blood sugar, and CRP. Ankle-brachial index to compare blood pressure in your arms to blood pressure in your ankles to see how your blood is flowing. Heart (cardiac) tests. Electrocardiogram (ECG) to check for heart damage. Stress test to see how your heart reacts to exercise. Ultrasound tests. Ultrasound of your peripheral arteries to check blood flow. Echocardiogram to get images of  your heart's   chambers and valves. X-ray tests. Chest X-ray to see if you have an enlarged heart, which is a sign of heart failure. CT scan to check for damage to your heart, brain, or arteries. Angiogram. This is a test where dye is injected and X-rays are used to see the blood flow in the arteries. How is this treated? This condition is treated with lifestyle changes as the first step. These may include: Changing your diet. Losing weight. Reducing stress. Exercising and being physically active more regularly. Quitting smoking. You may also need medicine to: Lower triglycerides and cholesterol. Control blood pressure. Prevent blood clots. Lower inflammation in your body. Control your blood sugar. Sometimes, surgery is needed to: Remove plaque from an artery (endarterectomy). Open or widen a narrowed heart artery or peripheral artery (angioplasty). Create a new path for your blood with one of these procedures: Heart (coronary) artery bypass graft surgery. Peripheral artery bypass graft surgery. Place a small mesh tube (stent) in an artery to open or widen a narrowed artery. Follow these instructions at home: Eating and drinking  Eat a heart-healthy diet. Talk with your health care provider or a dietitian if you need help. A heart-healthy diet involves: Limiting unhealthy fats and increasing healthy fats. Some examples of healthy fats are avocados and olive oil. Eating plant-based foods, such as fruits, vegetables, nuts, whole grains, and legumes (such as peas and lentils). If you drink alcohol: Limit how much you have to: 0-1 drink a day for women who are not pregnant. 0-2 drinks a day for men. Know how much alcohol is in a drink. In the [place] one drink equals one 12 oz bottle of beer (355 mL), one 5 oz glass of wine (148 mL), or one 1 oz glass of hard liquor (44 mL). Lifestyle  Maintain a healthy weight. Lose weight if your health care provider says that you need to do  that. Follow an exercise program as told by your health care provider. Do not use any products that contain nicotine or tobacco. These products include cigarettes, chewing tobacco, and vaping devices, such as e-cigarettes. If you need help quitting, ask your health care provider. Do not use drugs. General instructions Take over-the-counter and prescription medicines only as told by your health care provider. Manage other health conditions as told. Keep all follow-up visits. This is important. Contact a health care provider if you have: An irregular heartbeat. Unexplained tiredness (fatigue). Trouble urinating, or you are producing less urine or foamy urine. Swelling of your hands or feet, or itchy skin. Unexplained pain or numbness in your legs or hips. A wound that is slow to heal or is not healing. Get help right away if: You have any symptoms of a heart attack. These may be: Chest pain. This includes squeezing chest pain that may feel like indigestion (angina). Shortness of breath. Pain in your neck, jaw, arms, back, or stomach. Cold sweat. Nausea. Light-headedness. Sudden pain, numbness, or coldness in a limb. You have any symptoms of a stroke. "BE FAST" is an easy way to remember the main warning signs of a stroke: B - Balance. Signs are dizziness, sudden trouble walking, or loss of balance. E - Eyes. Signs are trouble seeing or a sudden change in vision. F - Face. Signs are sudden weakness or numbness of the face, or the face or eyelid drooping on one side. A - Arms. Signs are weakness or numbness in an arm. This happens suddenly and usually on one side of the body. S -  Speech. Signs are sudden trouble speaking, slurred speech, or trouble understanding what people say. T - Time. Time to call emergency services. Write down what time symptoms started. You have other signs of a stroke, such as: A sudden, severe headache with no known cause. Nausea or vomiting. Seizure. These  symptoms may represent a serious problem that is an emergency. Do not wait to see if the symptoms will go away. Get medical help right away. Call your local emergency services (911 in the [place] Do not drive yourself to the hospital. Summary Atherosclerosis is when plaque builds up in the arteries and causes narrowing and hardening of the arteries. Plaque occurs due to inflammation or from a buildup of fat, cholesterol, calcium, cellular waste products, and fibrin. This condition may not cause any symptoms. Symptoms of atherosclerosis do not occur until the plaque severely narrows or blocks the artery. Treatment starts with lifestyle changes and may include medicines. In some cases, surgery is needed. Get help right away if you have any symptoms of a heart attack or stroke. This information is not intended to replace advice given to you by your health care provider. Make sure you discuss any questions you have with your health care provider. Document Revised: 09/07/2020 Document Reviewed: 09/07/2020 Elsevier Patient Education  2023 [employer]   Hypertension, Adult Hypertension is another name for high blood pressure. High blood pressure forces your heart to work harder to pump blood. This can cause problems over time. There are two numbers in a blood pressure reading. There is a top number (systolic) over a bottom number (diastolic). It is best to have a blood pressure that is below 120/80. What are the causes? The cause of this condition is not known. Some other conditions can lead to high blood pressure. What increases the risk? Some lifestyle factors can make you more likely to develop high blood pressure: Smoking. Not getting enough exercise or physical activity. Being overweight. Having too much fat, sugar, calories, or salt (sodium) in your diet. Drinking too much alcohol. Other risk factors include: Having any of these conditions: Heart disease. Diabetes. High  cholesterol. Kidney disease. Obstructive sleep apnea. Having a family history of high blood pressure and high cholesterol. Age. The risk increases with age. Stress. What are the signs or symptoms? High blood pressure may not cause symptoms. Very high blood pressure (hypertensive crisis) may cause: Headache. Fast or uneven heartbeats (palpitations). Shortness of breath. Nosebleed. Vomiting or feeling like you may vomit (nauseous). Changes in how you see. Very bad chest pain. Feeling dizzy. Seizures. How is this treated? This condition is treated by making healthy lifestyle changes, such as: Eating healthy foods. Exercising more. Drinking less alcohol. Your doctor may prescribe medicine if lifestyle changes do not help enough and if: Your top number is above 130. Your bottom number is above 80. Your personal target blood pressure may vary. Follow these instructions at home: Eating and drinking  If told, follow the DASH eating plan. To follow this plan: Fill one half of your plate at each meal with fruits and vegetables. Fill one fourth of your plate at each meal with whole grains. Whole grains include whole-wheat pasta, brown rice, and whole-grain bread. Eat or drink low-fat dairy products, such as skim milk or low-fat yogurt. Fill one fourth of your plate at each meal with low-fat (lean) proteins. Low-fat proteins include fish, chicken without skin, eggs, beans, and tofu. Avoid fatty meat, cured and processed meat, or chicken with skin. Avoid pre-made or  processed food. Limit the amount of salt in your diet to less than 1,500 mg each day. Do not drink alcohol if: Your doctor tells you not to drink. You are pregnant, may be pregnant, or are planning to become pregnant. If you drink alcohol: Limit how much you have to: 0-1 drink a day for women. 0-2 drinks a day for men. Know how much alcohol is in your drink. In the [place] one drink equals one 12 oz bottle of beer (355 mL),  one 5 oz glass of wine (148 mL), or one 1 oz glass of hard liquor (44 mL). Lifestyle  Work with your doctor to stay at a healthy weight or to lose weight. Ask your doctor what the best weight is for you. Get at least 30 minutes of exercise that causes your heart to beat faster (aerobic exercise) most days of the week. This may include walking, swimming, or biking. Get at least 30 minutes of exercise that strengthens your muscles (resistance exercise) at least 3 days a week. This may include lifting weights or doing Pilates. Do not smoke or use any products that contain nicotine or tobacco. If you need help quitting, ask your doctor. Check your blood pressure at home as told by your doctor. Keep all follow-up visits. Medicines Take over-the-counter and prescription medicines only as told by your doctor. Follow directions carefully. Do not skip doses of blood pressure medicine. The medicine does not work as well if you skip doses. Skipping doses also puts you at risk for problems. Ask your doctor about side effects or reactions to medicines that you should watch for. Contact a doctor if: You think you are having a reaction to the medicine you are taking. You have headaches that keep coming back. You feel dizzy. You have swelling in your ankles. You have trouble with your vision. Get help right away if: You get a very bad headache. You start to feel mixed up (confused). You feel weak or numb. You feel faint. You have very bad pain in your: Chest. Belly (abdomen). You vomit more than once. You have trouble breathing. These symptoms may be an emergency. Get help right away. Call 911. Do not wait to see if the symptoms will go away. Do not drive yourself to the hospital. Summary Hypertension is another name for high blood pressure. High blood pressure forces your heart to work harder to pump blood. For most people, a normal blood pressure is less than 120/80. Making healthy choices can  help lower blood pressure. If your blood pressure does not get lower with healthy choices, you may need to take medicine. This information is not intended to replace advice given to you by your health care provider. Make sure you discuss any questions you have with your health care provider. Document Revised: 03/23/2021 Document Reviewed: 03/23/2021 Elsevier Patient Education  2023 [employer].

## 2022-05-31 NOTE — Progress Notes (Signed)
Kayla Calhoun,acting as a Education administrator for [name], MD.,have documented all relevant documentation on the behalf of [name], MD,as directed by  [name], MD while in the presence of [name], MD.    Subjective:     Patient ID: Kayla Calhoun , female    DOB: May 10, 1941 , 81 y.o.   MRN: [id number]   No chief complaint on file.   HPI  She presents today for BP check. She reports compliance with meds. She denies having any issues with headaches, chest pain and shortness of breath. She has no specific concerns or complaints at this time.   Hypertension This is a chronic problem. The current episode started more than 1 year ago. The problem has been gradually improving since onset. The problem is controlled. Pertinent negatives include no blurred vision, chest pain, palpitations or shortness of breath. Risk factors for coronary artery disease include obesity, sedentary lifestyle and post-menopausal state. Past treatments include calcium channel blockers, beta blockers and diuretics. The current treatment provides mild improvement. Hypertensive end-organ damage includes kidney disease.     Past Medical History:  Diagnosis Date   Atrial fibrillation ([place])    Glaucoma    Hypertension    Vitamin D deficiency      Family History  Problem Relation Age of Onset   Stroke Mother    Prostate cancer Father    Stroke Sister    Hypertension Sister    Hypertension Brother    Stroke Brother      Current Outpatient Medications:    ALPHAGAN P 0.1 % SOLN, Place 1 drop into both eyes in the morning and at bedtime., Disp: , Rfl:    amLODipine (NORVASC) 5 MG tablet, Take 1 tablet (5 mg total) by mouth daily., Disp: 90 tablet, Rfl: 2   Ascorbic Acid (VITAMIN C) 1000 MG tablet, Take 1,000 mg by mouth every other day. In the morning, Disp: , Rfl:    ASPIRIN LOW DOSE 81 MG EC tablet, TAKE 1 TABLET (81 MG TOTAL) BY MOUTH DAILY. SWALLOW WHOLE., Disp: 30 tablet, Rfl: 11    Cholecalciferol (VITAMIN D3) 50 MCG (2000 UT) TABS, Take 2,000 Units by mouth daily., Disp: , Rfl:    levobunolol (BETAGAN) 0.5 % ophthalmic solution, Place 1 drop into both eyes 2 (two) times daily., Disp: , Rfl:    metoprolol succinate (TOPROL-XL) 50 MG 24 hr tablet, TAKE 1 TABLET BY MOUTH EVERY DAY WITH OR IMMEDIATELY FOLLOWING A MEAL, Disp: 90 tablet, Rfl: 3   Noni, [place] citrifolia, (NONI JUICE PO), Take 60 mLs by mouth every other day., Disp: , Rfl:    TRAVATAN Z 0.004 % SOLN ophthalmic solution, Place 1 drop into both eyes at bedtime., Disp: , Rfl:    triamterene-hydrochlorothiazide (MAXZIDE-25) 37.5-25 MG tablet, TAKE 1/2 TABLET BY MOUTH EVERY DAY, Disp: 90 tablet, Rfl: 1   vitamin E 180 MG (400 UNITS) capsule, Take 400 Units by mouth 2 (two) times a week., Disp: , Rfl:    cetirizine (ZYRTEC) 10 MG tablet, Take 5 mg by mouth daily as needed for allergies. (Patient not taking: Reported on 05/31/2022), Disp: , Rfl:    Allergies  Allergen Reactions   Codeine Other (See Comments)   Sulfa Antibiotics Other (See Comments)   Contrast Media [Iodinated Contrast Media] Rash    Skin peeled (head to heel)     Review of Systems  Constitutional: Negative.   Eyes: Negative.  Negative for blurred vision.  Respiratory: Negative.  Negative for shortness of breath.   Cardiovascular: Negative.  Negative for chest pain and palpitations.  Gastrointestinal: Negative.   Endocrine: Negative for polydipsia, polyphagia and polyuria.  Musculoskeletal: Negative.   Skin: Negative.   Neurological: Negative.   Psychiatric/Behavioral: Negative.       Today's Vitals   05/31/22 1504  BP: 132/84  Pulse: 97  Temp: 98.7 F (37.1 C)  Weight: 183 lb (83 kg)  Height: _0  (1.575 m)  PainSc: 0-No pain   Body mass index is 33.47 kg/m.  Wt Readings from Last 3 Encounters:  05/31/22 183 lb (83 kg)  02/27/22 185 lb (83.9 kg)  12/06/21 182 lb (82.6 kg)     Objective:  Physical Exam Vitals and nursing  note reviewed.  Constitutional:      Appearance: Normal appearance.  HENT:     Head: Normocephalic and atraumatic.     Nose:     Comments: Masked     Mouth/Throat:     Comments: Masked Eyes:     Extraocular Movements: Extraocular movements intact.  Cardiovascular:     Rate and Rhythm: Normal rate. Rhythm irregular.     Heart sounds: Murmur heard.  Pulmonary:     Effort: Pulmonary effort is normal.     Breath sounds: Normal breath sounds.  Skin:    General: Skin is warm.  Neurological:     General: No focal deficit present.     Mental Status: She is alert.  Psychiatric:        Mood and Affect: Mood normal.        Behavior: Behavior normal.         Assessment And Plan:     1. Hypertensive heart and renal disease with renal failure, stage 1 through stage 4 or unspecified chronic kidney disease, without heart failure Comments: Chronic, fair control. Goal BP<130/80. However, for her age group, her current BP is acceptable. She will c/w amlodipine, metoprolol and Maxzide. F/u 6 months. - CMP14+EGFR - CBC no Diff  2. Chronic renal disease, stage II Comments: Chronic, she is reminded to avoid NSAIDS, keep BP controlled and stay well hydrated to decrease risk of CKD progression.  3. 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  She is not currently on statin therapy, will check LDL today. - Lipid panel  4. Longstanding persistent atrial fibrillation [facility]) Comments: She is s/p watchman device placement. She has not had any issues. Cardiology notes reviewed.  5. Other abnormal glucose Comments: Her a1c has been elevated in the past. I will recheck this today. She is encouraged to limit her intake of sugary beverages, including diet drinks. - Hemoglobin A1c  6. Class 1 obesity due to excess calories with serious comorbidity and body mass index (BMI) of 33.0 to 33.9 in adult Comments: She is encouraged to aim for at least 150 minutes of  exercise/week, while striving for BMI<30 to decrease cardiac risk.   Patient was given opportunity to ask questions. Patient verbalized understanding of the plan and was able to repeat key elements of the plan. All questions were answered to their satisfaction.   I, [name], MD, have reviewed all documentation for this visit. The documentation on 06/02/22 for the exam, diagnosis, procedures, and orders are all accurate and complete.   IF YOU HAVE BEEN REFERRED TO A SPECIALIST, IT MAY TAKE 1-2 WEEKS TO SCHEDULE/PROCESS THE REFERRAL. IF YOU HAVE NOT HEARD FROM US/SPECIALIST IN TWO WEEKS, PLEASE GIVE [place] A  CALL AT [phone] X [phone].   THE PATIENT IS ENCOURAGED TO PRACTICE SOCIAL DISTANCING DUE TO THE COVID-19 PANDEMIC.

## 2022-06-01 LAB — LIPID PANEL
Chol/HDL Ratio: 2.5 ratio (ref 0.0–4.4)
Cholesterol, Total: 166 mg/dL (ref 100–199)
HDL: 67 mg/dL (ref >39)
LDL Chol Calc (NIH): 90 mg/dL (ref 0–99)
Triglycerides: 44 mg/dL (ref 0–149)
VLDL Cholesterol Cal: 9 mg/dL (ref 5–40)

## 2022-06-01 LAB — CBC
Hematocrit: 38.4 % (ref 34.0–46.6)
Hemoglobin: 12.6 g/dL (ref 11.1–15.9)
MCH: 29.1 pg (ref 26.6–33.0)
MCHC: 32.8 g/dL (ref 31.5–35.7)
MCV: 89 fL (ref 79–97)
Platelets: 251 10*3/uL (ref 150–450)
RBC: 4.33 x10E6/uL (ref 3.77–5.28)
RDW: 12.9 % (ref 11.7–15.4)
WBC: 4.4 10*3/uL (ref 3.4–10.8)

## 2022-06-01 LAB — CMP14+EGFR
ALT: 10 IU/L (ref 0–32)
AST: 20 IU/L (ref 0–40)
Albumin/Globulin Ratio: 1.7 (ref 1.2–2.2)
Albumin: 4.3 g/dL (ref 3.7–4.7)
Alkaline Phosphatase: 66 IU/L (ref 44–121)
BUN/Creatinine Ratio: 20 (ref 12–28)
BUN: 16 mg/dL (ref 8–27)
Bilirubin Total: 0.5 mg/dL (ref 0.0–1.2)
CO2: 25 mmol/L (ref 20–29)
Calcium: 9.5 mg/dL (ref 8.7–10.3)
Chloride: 103 mmol/L (ref 96–106)
Creatinine, Ser: 0.79 mg/dL (ref 0.57–1.00)
Globulin, Total: 2.6 g/dL (ref 1.5–4.5)
Glucose: 83 mg/dL (ref 70–99)
Potassium: 4.2 mmol/L (ref 3.5–5.2)
Sodium: 140 mmol/L (ref 134–144)
Total Protein: 6.9 g/dL (ref 6.0–8.5)
eGFR: 74 mL/min/{1.73_m2} (ref >59)

## 2022-06-01 LAB — HEMOGLOBIN A1C
Est. average glucose Bld gHb Est-mCnc: 123 mg/dL
Hgb A1c MFr Bld: 5.9 % — ABNORMAL HIGH (ref 4.8–5.6)

## 2022-07-11 DIAGNOSIS — H04123 Dry eye syndrome of bilateral lacrimal glands: Secondary | ICD-10-CM | POA: Diagnosis not present

## 2022-07-11 DIAGNOSIS — H401123 Primary open-angle glaucoma, left eye, severe stage: Secondary | ICD-10-CM | POA: Diagnosis not present

## 2022-07-17 ENCOUNTER — Ambulatory Visit: Payer: HMO

## 2022-07-17 VITALS — BP 128/82 | Temp 98.1°F | Ht 62.0 in | Wt 183.0 lb

## 2022-07-17 DIAGNOSIS — I1310 Hypertensive heart and chronic kidney disease without heart failure, with stage 1 through stage 4 chronic kidney disease, or unspecified chronic kidney disease: Secondary | ICD-10-CM

## 2022-07-17 NOTE — Patient Instructions (Signed)
Hypertension, Adult Hypertension is another name for high blood pressure. High blood pressure forces your heart to work harder to pump blood. This can cause problems over time. There are two numbers in a blood pressure reading. There is a top number (systolic) over a bottom number (diastolic). It is best to have a blood pressure that is below 120/80. What are the causes? The cause of this condition is not known. Some other conditions can lead to high blood pressure. What increases the risk? Some lifestyle factors can make you more likely to develop high blood pressure: Smoking. Not getting enough exercise or physical activity. Being overweight. Having too much fat, sugar, calories, or salt (sodium) in your diet. Drinking too much alcohol. Other risk factors include: Having any of these conditions: Heart disease. Diabetes. High cholesterol. Kidney disease. Obstructive sleep apnea. Having a family history of high blood pressure and high cholesterol. Age. The risk increases with age. Stress. What are the signs or symptoms? High blood pressure may not cause symptoms. Very high blood pressure (hypertensive crisis) may cause: Headache. Fast or uneven heartbeats (palpitations). Shortness of breath. Nosebleed. Vomiting or feeling like you may vomit (nauseous). Changes in how you see. Very bad chest pain. Feeling dizzy. Seizures. How is this treated? This condition is treated by making healthy lifestyle changes, such as: Eating healthy foods. Exercising more. Drinking less alcohol. Your doctor may prescribe medicine if lifestyle changes do not help enough and if: Your top number is above 130. Your bottom number is above 80. Your personal target blood pressure may vary. Follow these instructions at home: Eating and drinking  If told, follow the DASH eating plan. To follow this plan: Fill one half of your plate at each meal with fruits and vegetables. Fill one fourth of your plate  at each meal with whole grains. Whole grains include whole-wheat pasta, brown rice, and whole-grain bread. Eat or drink low-fat dairy products, such as skim milk or low-fat yogurt. Fill one fourth of your plate at each meal with low-fat (lean) proteins. Low-fat proteins include fish, chicken without skin, eggs, beans, and tofu. Avoid fatty meat, cured and processed meat, or chicken with skin. Avoid pre-made or processed food. Limit the amount of salt in your diet to less than 1,500 mg each day. Do not drink alcohol if: Your doctor tells you not to drink. You are pregnant, may be pregnant, or are planning to become pregnant. If you drink alcohol: Limit how much you have to: 0-1 drink a day for women. 0-2 drinks a day for men. Know how much alcohol is in your drink. In the [place] one drink equals one 12 oz bottle of beer (355 mL), one 5 oz glass of wine (148 mL), or one 1 oz glass of hard liquor (44 mL). Lifestyle  Work with your doctor to stay at a healthy weight or to lose weight. Ask your doctor what the best weight is for you. Get at least 30 minutes of exercise that causes your heart to beat faster (aerobic exercise) most days of the week. This may include walking, swimming, or biking. Get at least 30 minutes of exercise that strengthens your muscles (resistance exercise) at least 3 days a week. This may include lifting weights or doing Pilates. Do not smoke or use any products that contain nicotine or tobacco. If you need help quitting, ask your doctor. Check your blood pressure at home as told by your doctor. Keep all follow-up visits. Medicines Take over-the-counter and prescription medicines  only as told by your doctor. Follow directions carefully. ?Do not skip doses of blood pressure medicine. The medicine does not work as well if you skip doses. Skipping doses also puts you at risk for problems. ?Ask your doctor about side effects or reactions to medicines that you should watch  for. ?Contact a doctor if: ?You think you are having a reaction to the medicine you are taking. ?You have headaches that keep coming back. ?You feel dizzy. ?You have swelling in your ankles. ?You have trouble with your vision. ?Get help right away if: ?You get a very bad headache. ?You start to feel mixed up (confused). ?You feel weak or numb. ?You feel faint. ?You have very bad pain in your: ?Chest. ?Belly (abdomen). ?You vomit more than once. ?You have trouble breathing. ?These symptoms may be an emergency. Get help right away. Call 911. ?Do not wait to see if the symptoms will go away. ?Do not drive yourself to the hospital. ?Summary ?Hypertension is another name for high blood pressure. ?High blood pressure forces your heart to work harder to pump blood. ?For most people, a normal blood pressure is less than 120/80. ?Making healthy choices can help lower blood pressure. If your blood pressure does not get lower with healthy choices, you may need to take medicine. ?This information is not intended to replace advice given to you by your health care provider. Make sure you discuss any questions you have with your health care provider. ?Document Revised: 03/23/2021 Document Reviewed: 03/23/2021 ?Elsevier Patient Education ? 2023 [employer] ? ?

## 2022-07-17 NOTE — Progress Notes (Signed)
Pt presents today for bpc. She currently reports taking Amlodipine 5MG  in the morning & in the evening. Triamterene-HCTZ 37.5-25 she reports taking at night. Denies headache, dizziness, chest pain. She drinks at least 2 bottles of water a day.   BP Readings from Last 3 Encounters:  07/17/22 128/82  05/31/22 132/84  02/27/22 120/80  During visit patient happened to be confused about which medications she takes at night & in the morning. Only knowing the color of the pills, not the name. She reports taking all medications in the morning & only taking eye drops at night before bed. Patient notified to bring in all medications to next visit. BP did improve.

## 2022-08-01 ENCOUNTER — Other Ambulatory Visit: Payer: Self-pay | Admitting: Internal Medicine

## 2022-08-01 DIAGNOSIS — I1310 Hypertensive heart and chronic kidney disease without heart failure, with stage 1 through stage 4 chronic kidney disease, or unspecified chronic kidney disease: Secondary | ICD-10-CM

## 2022-08-28 ENCOUNTER — Ambulatory Visit (HOSPITAL_COMMUNITY): Payer: HMO | Attending: Cardiology

## 2022-08-28 DIAGNOSIS — I340 Nonrheumatic mitral (valve) insufficiency: Secondary | ICD-10-CM | POA: Insufficient documentation

## 2022-08-28 LAB — ECHOCARDIOGRAM COMPLETE
Area-P 1/2: 5.07 cm2
MV M vel: 4.88 m/s
MV Peak grad: 95.3 mmHg
P 1/2 time: 325 msec
Radius: 0.53 cm
S' Lateral: 2.4 cm

## 2022-08-31 ENCOUNTER — Ambulatory Visit: Payer: Medicare Other | Admitting: Cardiology

## 2022-09-25 NOTE — Progress Notes (Unsigned)
Office Visit    Patient Name: Kayla Calhoun Date of Encounter: 09/25/2022  PCP:  Dorothyann PengSanders, Robyn, MD   [facility]  Cardiologist:  Donato SchultzMark Skains, MD  Advanced Practice Provider:  No care team member to display Electrophysiologist:  Lanier PrudeAMERON T LAMBERT, MD   HPI    Kayla Calhoun is a 82 y.o. female with a past medical history of atrial fibrillation, hypertension, and glaucoma presents today for follow-up visit.  Patient with history of moderate to severe mitral regurgitation with an eccentric jet.  Echocardiogram 02/21/2022 showed ejection fraction was normal.  Moderate to severe mitral regurgitation.  Prior TEE 2022 showed moderate MR.  Plan to repeat echocardiogram annually or with worsening symptoms.  She followed up with Dr. Lalla BrothersLambert 07/14/2021 where she was doing well at that time.  Her Plavix was discontinued and she was transition to 81 mg of aspirin.  Previously, she was here for follow-up of atrial fibrillation.  On 12/22/2020 Watchman device was placed.  Follow-up TEE on 02/01/2021 showed normal watchman.  Moderate central MR.  Previous visit 02/2021 reported being off anticoagulation and just taking aspirin and Plavix.  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  Overall has been doing well post watchman.  Plan to see Dr. Lalla BrothersLambert in January again.  May be able to come off of Plavix after 6 months of therapy.  She was last seen by Dr. Anne FuSkains 02/2022 and at that time was doing well.  No issues with weakness or difficulty breathing.  Today, she***  Past Medical History    Past Medical History:  Diagnosis Date   Atrial fibrillation (HCC)    Glaucoma    Hypertension    Vitamin D deficiency    Past Surgical History:  Procedure Laterality Date   ABDOMINAL HYSTERECTOMY     KIDNEY SURGERY     LEFT ATRIAL APPENDAGE OCCLUSION N/A 12/22/2020   Procedure: LEFT ATRIAL APPENDAGE OCCLUSION;  Surgeon: Lanier PrudeLambert,  Cameron T, MD;  Location: [facility] INVASIVE CV LAB;  Service: Cardiovascular;  Laterality: N/A;   TEE WITHOUT CARDIOVERSION N/A 12/22/2020   Procedure: TRANSESOPHAGEAL ECHOCARDIOGRAM (TEE);  Surgeon: Lanier PrudeLambert, Cameron T, MD;  [facility];  Service: Cardiovascular;  Laterality: N/A;   TEE WITHOUT CARDIOVERSION N/A 02/01/2021   Procedure: TRANSESOPHAGEAL ECHOCARDIOGRAM (TEE);  Surgeon: Thurmon Fairroitoru, Mihai, MD;  Location: [facility] ENDOSCOPY;  Service: Cardiovascular;  Laterality: N/A;   TONSILLECTOMY      Allergies  Allergies  Allergen Reactions   Codeine Other (See Comments)   Sulfa Antibiotics Other (See Comments)   Contrast Media [Iodinated Contrast Media] Rash    Skin peeled (head to heel)   EKGs/Labs/Other Studies Reviewed:   The following studies were reviewed today: Echo 02/21/2022:  IMPRESSIONS    1. Left ventricular ejection fraction, by estimation, is 60 to 65%. The  left ventricle has normal function. The left ventricle has no regional  wall motion abnormalities. There is mild left ventricular hypertrophy of  the basal-septal segment. Left  ventricular diastolic function could not be evaluated.   2. Right ventricular systolic function is normal. The right ventricular  size is normal. There is moderately elevated pulmonary artery systolic  pressure. The estimated right ventricular systolic pressure is 45.9 mmHg.   3. Left atrial size was severely dilated.   4. The mitral valve is degenerative. Moderate to severe mitral valve  regurgitation. No evidence of mitral stenosis. The MR EROA is 0.18cm2 c/w  mild  MR but this is underestimated in the setting of very eccentric jet.  The regurgitatant volume is 30cc c/w  moderate MR. Overall there appers to be at least moderate MR but given  eccentricity of jet this may underestimate the true severity. Consider TEE  for further evaluation if clinically indicated.   5. The aortic valve is tricuspid. Aortic valve regurgitation is mild.   Aortic valve sclerosis/calcification is present, without any evidence of  aortic stenosis. Aortic regurgitation PHT measures 601 msec.   6. The inferior vena cava is dilated in size with <50% respiratory  variability, suggesting right atrial pressure of 15 mmHg.   TEE 02/01/2021: IMPRESSIONS    1. Left ventricular ejection fraction, by estimation, is 60 to 65%. The  left ventricle has normal function. The left ventricle has no regional  wall motion abnormalities. Left ventricular diastolic function could not  be evaluated.   2. Right ventricular systolic function is normal. The right ventricular  size is normal. There is mildly elevated pulmonary artery systolic  pressure. The estimated right ventricular systolic pressure is 41.9 mmHg.   3. Well seated Watchman FLX device without thrombus or leak. Left atrial  size was severely dilated. No left atrial/left atrial appendage thrombus  was detected.   4. Right atrial size was moderately dilated.   5. The mitral valve is abnormal. Moderate mitral valve regurgitation.   6. Tricuspid valve regurgitation is moderate.   7. The aortic valve is tricuspid. Aortic valve regurgitation is trivial.  Mild aortic valve sclerosis is present, with no evidence of aortic valve  stenosis.   8. There is Moderate (Grade III) protruding plaque involving the  transverse aorta.    LAAO  12/22/2020: CONCLUSIONS:  1.Successful implantation of a WATCHMAN left atrial appendage occlusive device    2. TEE demonstrating no LAA thrombus 3. No early apparent complications.    CT Cardiac 10/13/2020: IMPRESSION: 1. 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   2. A 24 mm Watchman FLX device is recommended based on the above landing zone measurements (20.8 mm maximum diameter; 13% compression).   3. There is no thrombus in the left atrial appendage on delayed imaging.   4. A mid posterior IAS puncture site is recommended.   5. Optimal  deployment angle: RAO 17 CAU 3   6. Normal coronary origin. Right dominance. CAC score of 692, which is 90th percentile for age- and sex-matched controls.   7. Biatrial enlargement noted.    EKG:  EKG is *** ordered today.  The ekg ordered today demonstrates ***  Recent Labs: 05/31/2022: ALT 10; BUN 16; Creatinine, Ser 0.79; Hemoglobin 12.6; Platelets 251; Potassium 4.2; Sodium 140  Recent Lipid Panel    Component Value Date/Time   CHOL 166 05/31/2022 1540   TRIG 44 05/31/2022 1540   HDL 67 05/31/2022 1540   CHOLHDL 2.5 05/31/2022 1540   LDLCALC 90 05/31/2022 1540    Risk Assessment/Calculations:  {Does this patient have ATRIAL FIBRILLATION?[phone]}  Home Medications   No outpatient medications have been marked as taking for the 09/26/22 encounter (Appointment) with Sharlene Dory, PA-C.     Review of Systems   ***   All other systems reviewed and are otherwise negative except as noted above.  Physical Exam    VS:  There were no vitals taken for this visit. , BMI There is no height or weight on file to calculate BMI.  Wt Readings from Last 3 Encounters:  07/17/22 183  lb (83 kg)  05/31/22 183 lb (83 kg)  02/27/22 185 lb (83.9 kg)     GEN: Well nourished, well developed, in no acute distress. HEENT: normal. Neck: Supple, no JVD, carotid bruits, or masses. Cardiac: ***RRR, no murmurs, rubs, or gallops. No clubbing, cyanosis, edema.  ***Radials/PT 2+ and equal bilaterally.  Respiratory:  ***Respirations regular and unlabored, clear to auscultation bilaterally. GI: Soft, nontender, nondistended. MS: No deformity or atrophy. Skin: Warm and dry, no rash. Neuro:  Strength and sensation are intact. Psych: Normal affect.  Assessment & Plan    Mitral valve insufficiency Permanent atrial fibrillation Watchman left atrial appendage closure device Subarachnoid bleed HTN  No BP recorded.  {Refresh Note OR Click here to enter BP  :1}***      Disposition: Follow up  {follow up:15908} with Donato Schultz, MD or APP.  Signed, Sharlene Dory, PA-C 09/25/2022, 7:29 PM [facility]

## 2022-09-26 ENCOUNTER — Ambulatory Visit: Payer: Federal, State, Local not specified - PPO | Attending: Cardiology | Admitting: Physician Assistant

## 2022-09-26 ENCOUNTER — Encounter: Payer: Self-pay | Admitting: Physician Assistant

## 2022-09-26 VITALS — BP 118/78 | HR 69 | Ht 62.0 in | Wt 185.2 lb

## 2022-09-26 DIAGNOSIS — I10 Essential (primary) hypertension: Secondary | ICD-10-CM

## 2022-09-26 DIAGNOSIS — Z95818 Presence of other cardiac implants and grafts: Secondary | ICD-10-CM

## 2022-09-26 DIAGNOSIS — Z8679 Personal history of other diseases of the circulatory system: Secondary | ICD-10-CM | POA: Diagnosis not present

## 2022-09-26 DIAGNOSIS — I340 Nonrheumatic mitral (valve) insufficiency: Secondary | ICD-10-CM

## 2022-09-26 DIAGNOSIS — I4821 Permanent atrial fibrillation: Secondary | ICD-10-CM

## 2022-09-26 NOTE — Patient Instructions (Addendum)
Medication Instructions:  Your physician recommends that you continue on your current medications as directed. Please refer to the Current Medication list given to you today.  *If you need a refill on your cardiac medications before your next appointment, please call your pharmacy*  Lab Work: Have your primary care provider fax [place] a copy of your labs [phone] If you have labs (blood work) drawn today and your tests are completely normal, you will receive your results only by: MyChart Message (if you have MyChart) OR A paper copy in the mail If you have any lab test that is abnormal or we need to change your treatment, we will call you to review the results.  Testing/Procedures: Your physician has requested that you have an echocardiogram in March 2025. Echocardiography is a painless test that uses sound waves to create images of your heart. It provides your doctor with information about the size and shape of your heart and how well your heart's chambers and valves are working. This procedure takes approximately one hour. There are no restrictions for this procedure. Please do NOT wear cologne, perfume, aftershave, or lotions (deodorant is allowed). Please arrive 15 minutes prior to your appointment time.   Follow-Up: At [facility], you and your health needs are our priority.  As part of our continuing mission to provide you with exceptional heart care, we have created designated Provider Care Teams.  These Care Teams include your primary Cardiologist (physician) and Advanced Practice Providers (APPs -  Physician Assistants and Nurse Practitioners) who all work together to provide you with the care you need, when you need it.  Your next appointment:   6 month(s)  Provider:   Donato Schultz, MD    Low-Sodium Eating Plan Sodium, which is an element that makes up salt, helps you maintain a healthy balance of fluids in your body. Too much sodium can increase your blood pressure and  cause fluid and waste to be held in your body. Your health care provider or dietitian may recommend following this plan if you have high blood pressure (hypertension), kidney disease, liver disease, or heart failure. Eating less sodium can help lower your blood pressure, reduce swelling, and protect your heart, liver, and kidneys. What are tips for following this plan? Reading food labels The Nutrition Facts label lists the amount of sodium in one serving of the food. If you eat more than one serving, you must multiply the listed amount of sodium by the number of servings. Choose foods with less than 140 mg of sodium per serving. Avoid foods with 300 mg of sodium or more per serving. Shopping  Look for lower-sodium products, often labeled as "low-sodium" or "no salt added." Always check the sodium content, even if foods are labeled as "unsalted" or "no salt added." Buy fresh foods. Avoid canned foods and pre-made or frozen meals. Avoid canned, cured, or processed meats. Buy breads that have less than 80 mg of sodium per slice. Cooking  Eat more home-cooked food and less restaurant, buffet, and fast food. Avoid adding salt when cooking. Use salt-free seasonings or herbs instead of table salt or sea salt. Check with your health care provider or pharmacist before using salt substitutes. Cook with plant-based oils, such as canola, sunflower, or olive oil. Meal planning When eating at a restaurant, ask that your food be prepared with less salt or no salt, if possible. Avoid dishes labeled as brined, pickled, cured, smoked, or made with soy sauce, miso, or teriyaki sauce. Avoid  foods that contain MSG (monosodium glutamate). MSG is sometimes added to [place] food, bouillon, and some canned foods. Make meals that can be grilled, baked, poached, roasted, or steamed. These are generally made with less sodium. General information Most people on this plan should limit their sodium intake to 1,500-2,000  mg (milligrams) of sodium each day. What foods should I eat? Fruits Fresh, frozen, or canned fruit. Fruit juice. Vegetables Fresh or frozen vegetables. "No salt added" canned vegetables. "No salt added" tomato sauce and paste. Low-sodium or reduced-sodium tomato and vegetable juice. Grains Low-sodium cereals, including oats, puffed wheat and rice, and shredded wheat. Low-sodium crackers. Unsalted rice. Unsalted pasta. Low-sodium bread. Whole-grain breads and whole-grain pasta. Meats and other proteins Fresh or frozen (no salt added) meat, poultry, seafood, and fish. Low-sodium canned tuna and salmon. Unsalted nuts. Dried peas, beans, and lentils without added salt. Unsalted canned beans. Eggs. Unsalted nut butters. Dairy Milk. Soy milk. Cheese that is naturally low in sodium, such as ricotta cheese, fresh mozzarella, or Swiss cheese. Low-sodium or reduced-sodium cheese. Cream cheese. Yogurt. Seasonings and condiments Fresh and dried herbs and spices. Salt-free seasonings. Low-sodium mustard and ketchup. Sodium-free salad dressing. Sodium-free light mayonnaise. Fresh or refrigerated horseradish. Lemon juice. Vinegar. Other foods Homemade, reduced-sodium, or low-sodium soups. Unsalted popcorn and pretzels. Low-salt or salt-free chips. The items listed above may not be a complete list of foods and beverages you can eat. Contact a dietitian for more information. What foods should I avoid? Vegetables Sauerkraut, pickled vegetables, and relishes. Olives. [place] fries. Onion rings. Regular canned vegetables (not low-sodium or reduced-sodium). Regular canned tomato sauce and paste (not low-sodium or reduced-sodium). Regular tomato and vegetable juice (not low-sodium or reduced-sodium). Frozen vegetables in sauces. Grains Instant hot cereals. Bread stuffing, pancake, and biscuit mixes. Croutons. Seasoned rice or pasta mixes. Noodle soup cups. Boxed or frozen macaroni and cheese. Regular salted crackers.  Self-rising flour. Meats and other proteins Meat or fish that is salted, canned, smoked, spiced, or pickled. Precooked or cured meat, such as sausages or meat loaves. Kayla Calhoun. Ham. Pepperoni. Hot dogs. Corned beef. Chipped beef. Salt pork. Jerky. Pickled herring. Anchovies and sardines. Regular canned tuna. Salted nuts. Dairy Processed cheese and cheese spreads. Hard cheeses. Cheese curds. Blue cheese. Feta cheese. String cheese. Regular cottage cheese. Buttermilk. Canned milk. Fats and oils Salted butter. Regular margarine. Ghee. Bacon fat. Seasonings and condiments Onion salt, garlic salt, seasoned salt, table salt, and sea salt. Canned and packaged gravies. Worcestershire sauce. Tartar sauce. Barbecue sauce. Teriyaki sauce. Soy sauce, including reduced-sodium. Steak sauce. Fish sauce. Oyster sauce. Cocktail sauce. Horseradish that you find on the shelf. Regular ketchup and mustard. Meat flavorings and tenderizers. Bouillon cubes. Hot sauce. Pre-made or packaged marinades. Pre-made or packaged taco seasonings. Relishes. Regular salad dressings. Salsa. Other foods Salted popcorn and pretzels. Corn chips and puffs. Potato and tortilla chips. Canned or dried soups. Pizza. Frozen entrees and pot pies. The items listed above may not be a complete list of foods and beverages you should avoid. Contact a dietitian for more information. Summary Eating less sodium can help lower your blood pressure, reduce swelling, and protect your heart, liver, and kidneys. Most people on this plan should limit their sodium intake to 1,500-2,000 mg (milligrams) of sodium each day. Canned, boxed, and frozen foods are high in sodium. Restaurant foods, fast foods, and pizza are also very high in sodium. You also get sodium by adding salt to food. Try to cook at home, eat more fresh fruits and  vegetables, and eat less fast food and canned, processed, or prepared foods. This information is not intended to replace advice given to  you by your health care provider. Make sure you discuss any questions you have with your health care provider. Document Revised: 05/11/2019 Document Reviewed: 05/06/2019 Elsevier Patient Education  2023 [employer]  Heart-Healthy Eating Plan Many factors influence your heart health, including eating and exercise habits. Heart health is also called coronary health. Coronary risk increases with abnormal blood fat (lipid) levels. A heart-healthy eating plan includes limiting unhealthy fats, increasing healthy fats, limiting salt (sodium) intake, and making other diet and lifestyle changes. What is my plan? Your health care provider may recommend that: You limit your fat intake to _________% or less of your total calories each day. You limit your saturated fat intake to _________% or less of your total calories each day. You limit the amount of cholesterol in your diet to less than _________ mg per day. You limit the amount of sodium in your diet to less than _________ mg per day. What are tips for following this plan? Cooking Cook foods using methods other than frying. Baking, boiling, grilling, and broiling are all good options. Other ways to reduce fat include: Removing the skin from poultry. Removing all visible fats from meats. Steaming vegetables in water or broth. Meal planning  At meals, imagine dividing your plate into fourths: Fill one-half of your plate with vegetables and green salads. Fill one-fourth of your plate with whole grains. Fill one-fourth of your plate with lean protein foods. Eat 2-4 cups of vegetables per day. One cup of vegetables equals 1 cup (91 g) broccoli or cauliflower florets, 2 medium carrots, 1 large bell pepper, 1 large sweet potato, 1 large tomato, 1 medium white potato, 2 cups (150 g) raw leafy greens. Eat 1-2 cups of fruit per day. One cup of fruit equals 1 small apple, 1 large banana, 1 cup (237 g) mixed fruit, 1 large orange,  cup (82 g) dried  fruit, 1 cup (240 mL) 100% fruit juice. Eat more foods that contain soluble fiber. Examples include apples, broccoli, carrots, beans, peas, and barley. Aim to get 25-30 g of fiber per day. Increase your consumption of legumes, nuts, and seeds to 4-5 servings per week. One serving of dried beans or legumes equals  cup (90 g) cooked, 1 serving of nuts is  oz (12 almonds, 24 pistachios, or 7 walnut halves), and 1 serving of seeds equals  oz (8 g). Fats Choose healthy fats more often. Choose monounsaturated and polyunsaturated fats, such as olive and canola oils, avocado oil, flaxseeds, walnuts, almonds, and seeds. Eat more omega-3 fats. Choose salmon, mackerel, sardines, tuna, flaxseed oil, and ground flaxseeds. Aim to eat fish at least 2 times each week. Check food labels carefully to identify foods with trans fats or high amounts of saturated fat. Limit saturated fats. These are found in animal products, such as meats, butter, and cream. Plant sources of saturated fats include palm oil, palm kernel oil, and coconut oil. Avoid foods with partially hydrogenated oils in them. These contain trans fats. Examples are stick margarine, some tub margarines, cookies, crackers, and other baked goods. Avoid fried foods. General information Eat more home-cooked food and less restaurant, buffet, and fast food. Limit or avoid alcohol. Limit foods that are high in added sugar and simple starches such as foods made using white refined flour (white breads, pastries, sweets). Lose weight if you are overweight. Losing just 5-10% of  your body weight can help your overall health and prevent diseases such as diabetes and heart disease. Monitor your sodium intake, especially if you have high blood pressure. Talk with your health care provider about your sodium intake. Try to incorporate more vegetarian meals weekly. What foods should I eat? Fruits All fresh, canned (in natural juice), or frozen  fruits. Vegetables Fresh or frozen vegetables (raw, steamed, roasted, or grilled). Green salads. Grains Most grains. Choose whole wheat and whole grains most of the time. Rice and pasta, including brown rice and pastas made with whole wheat. Meats and other proteins Lean, well-trimmed beef, veal, pork, and lamb. Chicken and [place] without skin. All fish and shellfish. Wild duck, rabbit, pheasant, and venison. Egg whites or low-cholesterol egg substitutes. Dried beans, peas, lentils, and tofu. Seeds and most nuts. Dairy Low-fat or nonfat cheeses, including ricotta and mozzarella. Skim or 1% milk (liquid, powdered, or evaporated). Buttermilk made with low-fat milk. Nonfat or low-fat yogurt. Fats and oils Non-hydrogenated (trans-free) margarines. Vegetable oils, including soybean, sesame, sunflower, olive, avocado, peanut, safflower, corn, canola, and cottonseed. Salad dressings or mayonnaise made with a vegetable oil. Beverages Water (mineral or sparkling). Coffee and tea. Unsweetened ice tea. Diet beverages. Sweets and desserts Sherbet, gelatin, and fruit ice. Small amounts of dark chocolate. Limit all sweets and desserts. Seasonings and condiments All seasonings and condiments. The items listed above may not be a complete list of foods and beverages you can eat. Contact a dietitian for more options. What foods should I avoid? Fruits Canned fruit in heavy syrup. Fruit in cream or butter sauce. Fried fruit. Limit coconut. Vegetables Vegetables cooked in cheese, cream, or butter sauce. Fried vegetables. Grains Breads made with saturated or trans fats, oils, or whole milk. Croissants. Sweet rolls. Donuts. High-fat crackers, such as cheese crackers and chips. Meats and other proteins Fatty meats, such as hot dogs, ribs, sausage, bacon, rib-eye roast or steak. High-fat deli meats, such as salami and bologna. Caviar. Domestic duck and goose. Organ meats, such as liver. Dairy Cream, sour cream,  cream cheese, and creamed cottage cheese. Whole-milk cheeses. Whole or 2% milk (liquid, evaporated, or condensed). Whole buttermilk. Cream sauce or high-fat cheese sauce. Whole-milk yogurt. Fats and oils Meat fat, or shortening. Cocoa butter, hydrogenated oils, palm oil, coconut oil, palm kernel oil. Solid fats and shortenings, including bacon fat, salt pork, lard, and butter. Nondairy cream substitutes. Salad dressings with cheese or sour cream. Beverages Regular sodas and any drinks with added sugar. Sweets and desserts Frosting. Pudding. Cookies. Cakes. Pies. Milk chocolate or white chocolate. Buttered syrups. Full-fat ice cream or ice cream drinks. The items listed above may not be a complete list of foods and beverages to avoid. Contact a dietitian for more information. Summary Heart-healthy meal planning includes limiting unhealthy fats, increasing healthy fats, limiting salt (sodium) intake and making other diet and lifestyle changes. Lose weight if you are overweight. Losing just 5-10% of your body weight can help your overall health and prevent diseases such as diabetes and heart disease. Focus on eating a balance of foods, including fruits and vegetables, low-fat or nonfat dairy, lean protein, nuts and legumes, whole grains, and heart-healthy oils and fats. This information is not intended to replace advice given to you by your health care provider. Make sure you discuss any questions you have with your health care provider. Document Revised: 07/10/2021 Document Reviewed: 07/10/2021 Elsevier Patient Education  2023 [employer].

## 2022-11-07 DIAGNOSIS — H401123 Primary open-angle glaucoma, left eye, severe stage: Secondary | ICD-10-CM | POA: Diagnosis not present

## 2022-11-15 ENCOUNTER — Telehealth: Payer: Self-pay

## 2022-11-15 NOTE — Telephone Encounter (Signed)
Called to check in with patient, who had LAAO on 12/22/2020. The patient reports doing well with no issues.  Scheduled her for 6 month visit with Dr. Anne Fu 03/25/2023. She was grateful for call and agreed with plan.

## 2022-12-19 ENCOUNTER — Ambulatory Visit (INDEPENDENT_AMBULATORY_CARE_PROVIDER_SITE_OTHER): Payer: HMO | Admitting: Internal Medicine

## 2022-12-19 ENCOUNTER — Ambulatory Visit (INDEPENDENT_AMBULATORY_CARE_PROVIDER_SITE_OTHER): Payer: HMO

## 2022-12-19 ENCOUNTER — Encounter: Payer: Self-pay | Admitting: Internal Medicine

## 2022-12-19 VITALS — BP 118/80 | HR 80 | Temp 97.8°F | Ht 59.0 in | Wt 177.0 lb

## 2022-12-19 VITALS — BP 118/80 | HR 80 | Temp 97.8°F | Ht 59.2 in | Wt 177.6 lb

## 2022-12-19 DIAGNOSIS — Z6835 Body mass index (BMI) 35.0-35.9, adult: Secondary | ICD-10-CM

## 2022-12-19 DIAGNOSIS — I4811 Longstanding persistent atrial fibrillation: Secondary | ICD-10-CM | POA: Diagnosis not present

## 2022-12-19 DIAGNOSIS — I1310 Hypertensive heart and chronic kidney disease without heart failure, with stage 1 through stage 4 chronic kidney disease, or unspecified chronic kidney disease: Secondary | ICD-10-CM | POA: Diagnosis not present

## 2022-12-19 DIAGNOSIS — Z0000 Encounter for general adult medical examination without abnormal findings: Secondary | ICD-10-CM

## 2022-12-19 DIAGNOSIS — I2583 Coronary atherosclerosis due to lipid rich plaque: Secondary | ICD-10-CM

## 2022-12-19 DIAGNOSIS — N182 Chronic kidney disease, stage 2 (mild): Secondary | ICD-10-CM | POA: Diagnosis not present

## 2022-12-19 DIAGNOSIS — I2510 Atherosclerotic heart disease of native coronary artery without angina pectoris: Secondary | ICD-10-CM | POA: Diagnosis not present

## 2022-12-19 DIAGNOSIS — R7309 Other abnormal glucose: Secondary | ICD-10-CM

## 2022-12-19 MED ORDER — AMLODIPINE BESYLATE 5 MG PO TABS: 5.0000 mg | ORAL_TABLET | Freq: Every day | ORAL | 2 refills | Status: DC

## 2022-12-19 NOTE — Progress Notes (Signed)
Subjective:   Kayla Calhoun is a 82 y.o. female who presents for Medicare Annual (Subsequent) preventive examination.  Visit Complete: In person    Review of Systems     Cardiac Risk Factors include: advanced age (>75men, >11 women);hypertension;obesity (BMI >30kg/m2)     Objective:    Today's Vitals   12/19/22 1105  BP: 118/80  Pulse: 80  Temp: 97.8 F (36.6 C)  TempSrc: Oral  Weight: 177 lb 9.6 oz (80.6 kg)  Height: 4' 11.2" (1.504 m)   Body mass index is 35.63 kg/m.     12/19/2022   11:16 AM 12/06/2021   11:30 AM 02/01/2021    9:55 AM 12/22/2020    8:03 AM 11/16/2020   12:14 PM 10/28/2019    9:48 AM 10/22/2018    9:37 AM  Advanced Directives  Does Patient Have a Medical Advance Directive? Yes Yes Yes No Yes Yes Yes  Type of Estate agent of [place];Living will Healthcare Power of [place];Living will Healthcare Power of [place];Living will  Healthcare Power of [place];Living will Healthcare Power of [place];Living will Healthcare Power of [place];Living will  Copy of Healthcare Power of Attorney in Chart? No - copy requested No - copy requested Yes - validated most recent copy scanned in chart (See row information)  No - copy requested No - copy requested No - copy requested  Would patient like information on creating a medical advance directive?    No - Patient declined       Current Medications (verified) Outpatient Encounter Medications as of 12/19/2022  Medication Sig   ALPHAGAN P 0.1 % SOLN Place 1 drop into both eyes in the morning and at bedtime.   amLODipine (NORVASC) 5 MG tablet TAKE 1 TABLET (5 MG TOTAL) BY MOUTH DAILY.   Ascorbic Acid (VITAMIN C) 1000 MG tablet Take 1,000 mg by mouth every other day. In the morning   ASPIRIN LOW DOSE 81 MG EC tablet TAKE 1 TABLET (81 MG TOTAL) BY MOUTH DAILY. SWALLOW WHOLE.   Cholecalciferol (VITAMIN D3) 50 MCG (2000 UT) TABS Take 2,000 Units by mouth daily.   levobunolol (BETAGAN) 0.5 %  ophthalmic solution Place 1 drop into both eyes 2 (two) times daily.   LUMIGAN 0.01 % SOLN SMARTSIG:1 Drop(s) In Eye(s) Every Evening   metoprolol succinate (TOPROL-XL) 50 MG 24 hr tablet TAKE 1 TABLET BY MOUTH EVERY DAY WITH OR IMMEDIATELY FOLLOWING A MEAL   Noni, Morinda citrifolia, (NONI JUICE PO) Take 60 mLs by mouth every other day.   TRAVATAN Z 0.004 % SOLN ophthalmic solution Place 1 drop into both eyes at bedtime.   cetirizine (ZYRTEC) 10 MG tablet Take 5 mg by mouth daily as needed for allergies. (Patient not taking: Reported on 12/19/2022)   vitamin E 180 MG (400 UNITS) capsule Take 400 Units by mouth 2 (two) times a week. (Patient not taking: Reported on 12/19/2022)   No facility-administered encounter medications on file as of 12/19/2022.    Allergies (verified) Codeine, Sulfa antibiotics, and Contrast media [iodinated contrast media]   History: Past Medical History:  Diagnosis Date   Atrial fibrillation (HCC)    Glaucoma    Hypertension    Vitamin D deficiency    Past Surgical History:  Procedure Laterality Date   ABDOMINAL HYSTERECTOMY     KIDNEY SURGERY     LEFT ATRIAL APPENDAGE OCCLUSION N/A 12/22/2020   Procedure: LEFT ATRIAL APPENDAGE OCCLUSION;  Surgeon: Lanier Prude, MD;  Location: [facility] INVASIVE CV LAB;  Service: Cardiovascular;  Laterality: N/A;   TEE WITHOUT CARDIOVERSION N/A 12/22/2020   Procedure: TRANSESOPHAGEAL ECHOCARDIOGRAM (TEE);  Surgeon: Lanier Prude, MD;  Location: [facility] INVASIVE CV LAB;  Service: Cardiovascular;  Laterality: N/A;   TEE WITHOUT CARDIOVERSION N/A 02/01/2021   Procedure: TRANSESOPHAGEAL ECHOCARDIOGRAM (TEE);  Surgeon: Thurmon Fair, MD;  Location: [facility] ENDOSCOPY;  Service: Cardiovascular;  Laterality: N/A;   TONSILLECTOMY     Family History  Problem Relation Age of Onset   Stroke Mother    Prostate cancer Father    Stroke Sister    Hypertension Sister    Hypertension Brother    Stroke Brother    Social History   Socioeconomic  History   Marital status: Widowed    Spouse name: Not on file   Number of children: 1   Years of education: Not on file   Highest education level: Not on file  Occupational History   Occupation: retired  Tobacco Use   Smoking status: Former    Years: 10    Types: Cigarettes   Smokeless tobacco: Never   Tobacco comments:    been quit 35 years  Vaping Use   Vaping Use: Never used  Substance and Sexual Activity   Alcohol use: Not Currently   Drug use: Never   Sexual activity: Not Currently  Other Topics Concern   Not on file  Social History Narrative   Not on file   Social Determinants of Health   Financial Resource Strain: Low Risk  (12/19/2022)   Overall Financial Resource Strain (CARDIA)    Difficulty of Paying Living Expenses: Not hard at all  Food Insecurity: No Food Insecurity (12/19/2022)   Hunger Vital Sign    Worried About Running Out of Food in the Last Year: Never true    Ran Out of Food in the Last Year: Never true  Transportation Needs: No Transportation Needs (12/19/2022)   PRAPARE - Administrator, Civil Service (Medical): No    Lack of Transportation (Non-Medical): No  Physical Activity: Sufficiently Active (12/19/2022)   Exercise Vital Sign    Days of Exercise per Week: 5 days    Minutes of Exercise per Session: 50 min  Stress: No Stress Concern Present (12/19/2022)   [employer] of Occupational Health - Occupational Stress Questionnaire    Feeling of Stress : Not at all  Social Connections: Moderately Isolated (12/19/2022)   Social Connection and Isolation Panel [NHANES]    Frequency of Communication with Friends and Family: More than three times a week    Frequency of Social Gatherings with Friends and Family: Once a week    Attends Religious Services: More than 4 times per year    Active Member of [employer] or Organizations: No    Attends Banker Meetings: Never    Marital Status: Widowed    Tobacco Counseling Counseling given:  Not Answered Tobacco comments: been quit 35 years   Clinical Intake:  Pre-visit preparation completed: Yes  Pain : No/denies pain     Nutritional Status: BMI > 30  Obese Nutritional Risks: None Diabetes: No  How often do you need to have someone help you when you read instructions, pamphlets, or other written materials from your doctor or pharmacy?: 1 - Never  Interpreter Needed?: No  Information entered by :: NAllen LPN   Activities of Daily Living    12/19/2022   11:07 AM  In your present state of health, do you have any difficulty performing the following  activities:  Hearing? 1  Comment has hearing aids  Vision? 0  Difficulty concentrating or making decisions? 0  Walking or climbing stairs? 0  Dressing or bathing? 0  Doing errands, shopping? 0  Preparing Food and eating ? N  Using the Toilet? N  In the past six months, have you accidently leaked urine? Y  Comment with a cough or laugh  Do you have problems with loss of bowel control? N  Managing your Medications? N  Managing your Finances? N  Housekeeping or managing your Housekeeping? N    Patient Care Team: Dorothyann Peng, MD as PCP - General (Internal Medicine) Lanier Prude, MD as PCP - Electrophysiology (Cardiology) Jake Bathe, MD as PCP - Cardiology (Cardiology)  Indicate any recent Medical Services you may have received from other than [facility] providers in the past year (date may be approximate).     Assessment:   This is a routine wellness examination for [place].  Hearing/Vision screen Hearing Screening - Comments:: Has hearing aids that are maintained Vision Screening - Comments:: Regular eye exams, Dr. Burgess Estelle, [facility]  Dietary issues and exercise activities discussed:     Goals Addressed             This Visit's Progress    Patient Stated       12/19/2022, work on myself       Depression Screen    12/19/2022   11:18 AM 12/06/2021   11:31 AM 11/30/2021   11:10 AM  11/16/2020   12:15 PM 11/02/2020    8:54 AM 10/28/2019    9:48 AM 10/28/2019    9:30 AM  PHQ 2/9 Scores  PHQ - 2 Score 0 0 0 0 0 0 0  PHQ- 9 Score 0     0     Fall Risk    12/19/2022   11:17 AM 12/06/2021   11:30 AM 11/30/2021   11:11 AM 11/16/2020   12:15 PM 11/02/2020    8:46 AM  Fall Risk   Falls in the past year? 0 0 0 0 0  Number falls in past yr: 0 0 0    Injury with Fall? 0 0 0    Risk for fall due to : Medication side effect Medication side effect No Fall Risks Medication side effect   Follow up Falls prevention discussed;Falls evaluation completed Falls evaluation completed;Education provided;Falls prevention discussed Falls evaluation completed Falls evaluation completed;Education provided;Falls prevention discussed     MEDICARE RISK AT HOME:  Medicare Risk at Home - 12/19/22 1117     Any stairs in or around the home? Yes    If so, are there any without handrails? No    Home free of loose throw rugs in walkways, pet beds, electrical cords, etc? Yes    Adequate lighting in your home to reduce risk of falls? Yes    Life alert? No    Use of a cane, walker or w/c? No    Grab bars in the bathroom? No    Shower chair or bench in shower? No    Elevated toilet seat or a handicapped toilet? Yes             TIMED UP AND GO:  Was the test performed?  Yes  Length of time to ambulate 10 feet: 6 sec Gait slow and steady without use of assistive device    Cognitive Function:        12/19/2022   11:19 AM 05/31/2022  3:31 PM 12/06/2021   11:32 AM 11/16/2020   12:18 PM 10/28/2019    9:52 AM  6CIT Screen  What Year? 0 points  0 points 0 points 0 points  What month? 0 points 0 points 0 points 0 points 0 points  What time? 0 points 0 points 3 points 0 points 0 points  Count back from 20 0 points 0 points 0 points 0 points 0 points  Months in reverse 0 points 0 points 0 points 0 points 0 points  Repeat phrase 2 points  0 points 0 points 0 points  Total Score 2 points  3 points  0 points 0 points    Immunizations Immunization History  Administered Date(s) Administered   PFIZER(Purple Top)SARS-COV-2 Vaccination 08/20/2019, 09/15/2019, 04/16/2020   Pfizer Covid-19 Vaccine Bivalent Booster 76yrs & up 05/22/2021   Pneumococcal Conjugate-13 11/19/2019   Pneumococcal Polysaccharide-23 03/27/2018   Tdap 06/30/2012    TDAP status: Up to date  Flu Vaccine status: Up to date  Pneumococcal vaccine status: Up to date  Covid-19 vaccine status: Completed vaccines  Qualifies for Shingles Vaccine? Yes   Zostavax completed No   Shingrix Completed?: Yes  Screening Tests Health Maintenance  Topic Date Due   Zoster Vaccines- Shingrix (1 of 2) Never done   COVID-19 Vaccine (5 - 2023-24 season) 02/16/2022   Medicare Annual Wellness (AWV)  12/07/2022   INFLUENZA VACCINE  01/17/2023   Pneumonia Vaccine 27+ Years old  Completed   DEXA SCAN  Completed   HPV VACCINES  Aged Out   DTaP/Tdap/Td  Discontinued    Health Maintenance  Health Maintenance Due  Topic Date Due   Zoster Vaccines- Shingrix (1 of 2) Never done   COVID-19 Vaccine (5 - 2023-24 season) 02/16/2022   Medicare Annual Wellness (AWV)  12/07/2022    Colorectal cancer screening: No longer required.   Mammogram status: No longer required due to age.  Bone Density status: Completed 01/28/2019.  Lung Cancer Screening: (Low Dose CT Chest recommended if Age 85-80 years, 20 pack-year currently smoking OR have quit w/in 15years.) does not qualify.   Lung Cancer Screening Referral: no  Additional Screening:  Hepatitis C Screening: does not qualify;   Vision Screening: Recommended annual ophthalmology exams for early detection of glaucoma and other disorders of the eye. Is the patient up to date with their annual eye exam?  Yes  Who is the provider or what is the name of the office in which the patient attends annual eye exams? Dr. Burgess Estelle If pt is not established with a provider, would they like to be  referred to a provider to establish care? No .   Dental Screening: Recommended annual dental exams for proper oral hygiene  Diabetic Foot Exam: n/a  Community Resource Referral / Chronic Care Management: CRR required this visit?  No   CCM required this visit?  No     Plan:     I have personally reviewed and noted the following in the patient's chart:   Medical and social history Use of alcohol, tobacco or illicit drugs  Current medications and supplements including opioid prescriptions. Patient is not currently taking opioid prescriptions. Functional ability and status Nutritional status Physical activity Advanced directives List of other physicians Hospitalizations, surgeries, and ER visits in previous 12 months Vitals Screenings to include cognitive, depression, and falls Referrals and appointments  In addition, I have reviewed and discussed with patient certain preventive protocols, quality metrics, and best practice recommendations. A written personalized care plan  for preventive services as well as general preventive health recommendations were provided to patient.     Barb Merino, LPN   06/23/1094   After Visit Summary: in person  Nurse Notes: none

## 2022-12-19 NOTE — Progress Notes (Signed)
I,Victoria T Deloria Lair, CMA,acting as a Neurosurgeon for Gwynneth Aliment, MD.,have documented all relevant documentation on the behalf of Gwynneth Aliment, MD,as directed by  Gwynneth Aliment, MD while in the presence of Gwynneth Aliment, MD.  Subjective:  Patient ID: Kayla Calhoun , female    DOB: Apr 20, 1941 , 82 y.o.   MRN: [id number]  Chief Complaint  Patient presents with   Hypertension    HPI  She presents today for BP check. She reports compliance with meds. She denies having any issues with headaches, chest pain and shortness of breath. She has no specific concerns or complaints at this time.   AWV completed with [facility] Advisor, Sheral Apley.      Hypertension This is a chronic problem. The current episode started more than 1 year ago. The problem has been gradually improving since onset. The problem is controlled. Pertinent negatives include no blurred vision, chest pain, palpitations or shortness of breath. Risk factors for coronary artery disease include obesity, sedentary lifestyle and post-menopausal state. Past treatments include calcium channel blockers, beta blockers and diuretics. The current treatment provides mild improvement. Hypertensive end-organ damage includes kidney disease.     Past Medical History:  Diagnosis Date   Atrial fibrillation (HCC)    Glaucoma    Hypertension    Vitamin D deficiency      Family History  Problem Relation Age of Onset   Stroke Mother    Prostate cancer Father    Stroke Sister    Hypertension Sister    Hypertension Brother    Stroke Brother      Current Outpatient Medications:    ALPHAGAN P 0.1 % SOLN, Place 1 drop into both eyes in the morning and at bedtime., Disp: , Rfl:    amLODipine (NORVASC) 5 MG tablet, Take 1 tablet (5 mg total) by mouth daily., Disp: 90 tablet, Rfl: 2   Ascorbic Acid (VITAMIN C) 1000 MG tablet, Take 1,000 mg by mouth every other day. In the morning, Disp: , Rfl:    ASPIRIN LOW DOSE 81 MG EC tablet, TAKE 1 TABLET  (81 MG TOTAL) BY MOUTH DAILY. SWALLOW WHOLE., Disp: 30 tablet, Rfl: 11   cetirizine (ZYRTEC) 10 MG tablet, Take 5 mg by mouth daily as needed for allergies. (Patient not taking: Reported on 12/19/2022), Disp: , Rfl:    Cholecalciferol (VITAMIN D3) 50 MCG (2000 UT) TABS, Take 2,000 Units by mouth daily., Disp: , Rfl:    levobunolol (BETAGAN) 0.5 % ophthalmic solution, Place 1 drop into both eyes 2 (two) times daily., Disp: , Rfl:    LUMIGAN 0.01 % SOLN, SMARTSIG:1 Drop(s) In Eye(s) Every Evening, Disp: , Rfl:    metoprolol succinate (TOPROL-XL) 50 MG 24 hr tablet, TAKE 1 TABLET BY MOUTH EVERY DAY WITH OR IMMEDIATELY FOLLOWING A MEAL, Disp: 90 tablet, Rfl: 3   Noni, Morinda citrifolia, (NONI JUICE PO), Take 60 mLs by mouth every other day., Disp: , Rfl:    TRAVATAN Z 0.004 % SOLN ophthalmic solution, Place 1 drop into both eyes at bedtime., Disp: , Rfl:    vitamin E 180 MG (400 UNITS) capsule, Take 400 Units by mouth 2 (two) times a week. (Patient not taking: Reported on 12/19/2022), Disp: , Rfl:    Allergies  Allergen Reactions   Codeine Other (See Comments)   Sulfa Antibiotics Other (See Comments)   Contrast Media [Iodinated Contrast Media] Rash    Skin peeled (head to heel)     Review of Systems  Constitutional: Negative.   Eyes:  Negative for blurred vision.  Respiratory: Negative.  Negative for shortness of breath.   Cardiovascular: Negative.  Negative for chest pain and palpitations.  Gastrointestinal: Negative.   Neurological: Negative.   Psychiatric/Behavioral: Negative.       Today's Vitals   12/19/22 1113  BP: 118/80  Pulse: 80  Temp: 97.8 F (36.6 C)  SpO2: 98%  Weight: 177 lb (80.3 kg)  Height: 4\' 11"  (1.499 m)   Body mass index is 35.75 kg/m.  Wt Readings from Last 3 Encounters:  12/19/22 177 lb (80.3 kg)  12/19/22 177 lb 9.6 oz (80.6 kg)  09/26/22 185 lb 3.2 oz (84 kg)     Objective:  Physical Exam Vitals and nursing note reviewed.  Constitutional:       Appearance: Normal appearance.  HENT:     Head: Normocephalic and atraumatic.  Eyes:     Extraocular Movements: Extraocular movements intact.  Cardiovascular:     Rate and Rhythm: Normal rate. Rhythm irregular.     Heart sounds: Normal heart sounds.  Pulmonary:     Effort: Pulmonary effort is normal.     Breath sounds: Normal breath sounds.  Skin:    General: Skin is warm.  Neurological:     General: No focal deficit present.     Mental Status: She is alert.  Psychiatric:        Mood and Affect: Mood normal.        Behavior: Behavior normal.         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 She wil c/w amlodipine 5mg  and metoprolol XL 50mg  daily. She is encouraged to follow low sodium diet and to follow a heart healthy lifestyle.   Orders: -     CBC -     CMP14+EGFR -     Lipid panel -     Hepatitis C antibody -     amLODIPine Besylate; Take 1 tablet (5 mg total) by mouth daily.  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 CAC score >600.   She will c/w ASA 81mg , metoprolol XL 50mg  daily,   Orders: -     Lipid panel  Longstanding persistent atrial fibrillation [facility]) Assessment & Plan: Chronic, she is s/p Watchman procedure. No longer needs anticoagulation. She is currently on Bblocker therapy.    Chronic renal disease, stage II Assessment & Plan: Chronic, she is encouraged to avoid NSAIDS, stay hydrated and keep BP well controlled to decrease risk of CKD progression.    Other abnormal glucose Assessment & Plan: Previous labs reviewed, her A1c has been elevated in the past. I will check an A1c today. Reminded to avoid refined sugars including sugary drinks/foods and processed meats including bacon, sausages and deli meats.     Orders: -     CMP14+EGFR -     Hemoglobin A1c  Class 2 severe obesity due to excess calories with serious comorbidity and body mass index (BMI) of 35.0 to 35.9 in adult [facility]) Assessment & Plan: She is encouraged to strive for BMI less than 30 to decrease cardiac risk. Advised to aim for at least 150 minutes of exercise per week.       Return in about 6 months (around 06/21/2023) for 6 month bp.  Patient was given opportunity to ask questions. Patient verbalized understanding of the plan and was able to repeat key elements of the plan. All questions were answered to their satisfaction.   I, Gwynneth Aliment, MD, have reviewed all documentation for this visit. The documentation on 12/19/22 for the exam, diagnosis, procedures, and orders are all accurate and complete.  IF YOU HAVE BEEN REFERRED TO A SPECIALIST, IT MAY TAKE 1-2 WEEKS TO SCHEDULE/PROCESS THE REFERRAL. IF YOU HAVE NOT HEARD FROM US/SPECIALIST IN TWO WEEKS, PLEASE GIVE [place] A CALL AT [phone] X [phone].   THE PATIENT IS ENCOURAGED TO PRACTICE SOCIAL DISTANCING DUE TO THE COVID-19 PANDEMIC.

## 2022-12-19 NOTE — Patient Instructions (Signed)
Kayla Calhoun , Thank you for taking time to come for your Medicare Wellness Visit. I appreciate your ongoing commitment to your health goals. Please review the following plan we discussed and let me know if I can assist you in the future.   These are the goals we discussed:  Goals      DIET - INCREASE WATER INTAKE     Patient Stated     10/28/2019, wants to keep weight down     Patient Stated     11/16/2020, no goals     Patient Stated     12/06/2021, wants to lose 7 pounds     Patient Stated     12/19/2022, work on myself        This is a list of the screening recommended for you and due dates:  Health Maintenance  Topic Date Due   COVID-19 Vaccine (5 - 2023-24 season) 02/16/2022   Zoster (Shingles) Vaccine (2 of 2) 04/27/2022   Flu Shot  01/17/2023   Medicare Annual Wellness Visit  12/19/2023   Pneumonia Vaccine  Completed   DEXA scan (bone density measurement)  Completed   HPV Vaccine  Aged Out   DTaP/Tdap/Td vaccine  Discontinued    Advanced directives: Please bring a copy of your POA (Power of [place]) and/or Living Will to your next appointment.   Conditions/risks identified: none  Next appointment: Follow up in one year for your annual wellness visit    Preventive Care 65 Years and Older, Female Preventive care refers to lifestyle choices and visits with your health care provider that can promote health and wellness. What does preventive care include? A yearly physical exam. This is also called an annual well check. Dental exams once or twice a year. Routine eye exams. Ask your health care provider how often you should have your eyes checked. Personal lifestyle choices, including: Daily care of your teeth and gums. Regular physical activity. Eating a healthy diet. Avoiding tobacco and drug use. Limiting alcohol use. Practicing safe sex. Taking low-dose aspirin every day. Taking vitamin and mineral supplements as recommended by your health care provider. What  happens during an annual well check? The services and screenings done by your health care provider during your annual well check will depend on your age, overall health, lifestyle risk factors, and family history of disease. Counseling  Your health care provider may ask you questions about your: Alcohol use. Tobacco use. Drug use. Emotional well-being. Home and relationship well-being. Sexual activity. Eating habits. History of falls. Memory and ability to understand (cognition). Work and work Astronomer. Reproductive health. Screening  You may have the following tests or measurements: Height, weight, and BMI. Blood pressure. Lipid and cholesterol levels. These may be checked every 5 years, or more frequently if you are over 89 years old. Skin check. Lung cancer screening. You may have this screening every year starting at age 76 if you have a 30-pack-year history of smoking and currently smoke or have quit within the past 15 years. Fecal occult blood test (FOBT) of the stool. You may have this test every year starting at age 5. Flexible sigmoidoscopy or colonoscopy. You may have a sigmoidoscopy every 5 years or a colonoscopy every 10 years starting at age 68. Hepatitis C blood test. Hepatitis B blood test. Sexually transmitted disease (STD) testing. Diabetes screening. This is done by checking your blood sugar (glucose) after you have not eaten for a while (fasting). You may have this done every 1-3 years. Bone  density scan. This is done to screen for osteoporosis. You may have this done starting at age 43. Mammogram. This may be done every 1-2 years. Talk to your health care provider about how often you should have regular mammograms. Talk with your health care provider about your test results, treatment options, and if necessary, the need for more tests. Vaccines  Your health care provider may recommend certain vaccines, such as: Influenza vaccine. This is recommended every  year. Tetanus, diphtheria, and acellular pertussis (Tdap, Td) vaccine. You may need a Td booster every 10 years. Zoster vaccine. You may need this after age 53. Pneumococcal 13-valent conjugate (PCV13) vaccine. One dose is recommended after age 57. Pneumococcal polysaccharide (PPSV23) vaccine. One dose is recommended after age 11. Talk to your health care provider about which screenings and vaccines you need and how often you need them. This information is not intended to replace advice given to you by your health care provider. Make sure you discuss any questions you have with your health care provider. Document Released: 07/01/2015 Document Revised: 02/22/2016 Document Reviewed: 04/05/2015 Elsevier Interactive Patient Education  2017 [employer].  Fall Prevention in the Home Falls can cause injuries. They can happen to people of all ages. There are many things you can do to make your home safe and to help prevent falls. What can I do on the outside of my home? Regularly fix the edges of walkways and driveways and fix any cracks. Remove anything that might make you trip as you walk through a door, such as a raised step or threshold. Trim any bushes or trees on the path to your home. Use bright outdoor lighting. Clear any walking paths of anything that might make someone trip, such as rocks or tools. Regularly check to see if handrails are loose or broken. Make sure that both sides of any steps have handrails. Any raised decks and porches should have guardrails on the edges. Have any leaves, snow, or ice cleared regularly. Use sand or salt on walking paths during winter. Clean up any spills in your garage right away. This includes oil or grease spills. What can I do in the bathroom? Use night lights. Install grab bars by the toilet and in the tub and shower. Do not use towel bars as grab bars. Use non-skid mats or decals in the tub or shower. If you need to sit down in the shower, use a  plastic, non-slip stool. Keep the floor dry. Clean up any water that spills on the floor as soon as it happens. Remove soap buildup in the tub or shower regularly. Attach bath mats securely with double-sided non-slip rug tape. Do not have throw rugs and other things on the floor that can make you trip. What can I do in the bedroom? Use night lights. Make sure that you have a light by your bed that is easy to reach. Do not use any sheets or blankets that are too big for your bed. They should not hang down onto the floor. Have a firm chair that has side arms. You can use this for support while you get dressed. Do not have throw rugs and other things on the floor that can make you trip. What can I do in the kitchen? Clean up any spills right away. Avoid walking on wet floors. Keep items that you use a lot in easy-to-reach places. If you need to reach something above you, use a strong step stool that has a grab bar. Keep  electrical cords out of the way. Do not use floor polish or wax that makes floors slippery. If you must use wax, use non-skid floor wax. Do not have throw rugs and other things on the floor that can make you trip. What can I do with my stairs? Do not leave any items on the stairs. Make sure that there are handrails on both sides of the stairs and use them. Fix handrails that are broken or loose. Make sure that handrails are as long as the stairways. Check any carpeting to make sure that it is firmly attached to the stairs. Fix any carpet that is loose or worn. Avoid having throw rugs at the top or bottom of the stairs. If you do have throw rugs, attach them to the floor with carpet tape. Make sure that you have a light switch at the top of the stairs and the bottom of the stairs. If you do not have them, ask someone to add them for you. What else can I do to help prevent falls? Wear shoes that: Do not have high heels. Have rubber bottoms. Are comfortable and fit you  well. Are closed at the toe. Do not wear sandals. If you use a stepladder: Make sure that it is fully opened. Do not climb a closed stepladder. Make sure that both sides of the stepladder are locked into place. Ask someone to hold it for you, if possible. Clearly mark and make sure that you can see: Any grab bars or handrails. First and last steps. Where the edge of each step is. Use tools that help you move around (mobility aids) if they are needed. These include: Canes. Walkers. Scooters. Crutches. Turn on the lights when you go into a dark area. Replace any light bulbs as soon as they burn out. Set up your furniture so you have a clear path. Avoid moving your furniture around. If any of your floors are uneven, fix them. If there are any pets around you, be aware of where they are. Review your medicines with your doctor. Some medicines can make you feel dizzy. This can increase your chance of falling. Ask your doctor what other things that you can do to help prevent falls. This information is not intended to replace advice given to you by your health care provider. Make sure you discuss any questions you have with your health care provider. Document Released: 03/31/2009 Document Revised: 11/10/2015 Document Reviewed: 07/09/2014 Elsevier Interactive Patient Education  2017 [employer].

## 2022-12-19 NOTE — Patient Instructions (Signed)
Hypertension, Adult Hypertension is another name for high blood pressure. High blood pressure forces your heart to work harder to pump blood. This can cause problems over time. There are two numbers in a blood pressure reading. There is a top number (systolic) over a bottom number (diastolic). It is best to have a blood pressure that is below 120/80. What are the causes? The cause of this condition is not known. Some other conditions can lead to high blood pressure. What increases the risk? Some lifestyle factors can make you more likely to develop high blood pressure: Smoking. Not getting enough exercise or physical activity. Being overweight. Having too much fat, sugar, calories, or salt (sodium) in your diet. Drinking too much alcohol. Other risk factors include: Having any of these conditions: Heart disease. Diabetes. High cholesterol. Kidney disease. Obstructive sleep apnea. Having a family history of high blood pressure and high cholesterol. Age. The risk increases with age. Stress. What are the signs or symptoms? High blood pressure may not cause symptoms. Very high blood pressure (hypertensive crisis) may cause: Headache. Fast or uneven heartbeats (palpitations). Shortness of breath. Nosebleed. Vomiting or feeling like you may vomit (nauseous). Changes in how you see. Very bad chest pain. Feeling dizzy. Seizures. How is this treated? This condition is treated by making healthy lifestyle changes, such as: Eating healthy foods. Exercising more. Drinking less alcohol. Your doctor may prescribe medicine if lifestyle changes do not help enough and if: Your top number is above 130. Your bottom number is above 80. Your personal target blood pressure may vary. Follow these instructions at home: Eating and drinking  If told, follow the DASH eating plan. To follow this plan: Fill one half of your plate at each meal with fruits and vegetables. Fill one fourth of your plate  at each meal with whole grains. Whole grains include whole-wheat pasta, brown rice, and whole-grain bread. Eat or drink low-fat dairy products, such as skim milk or low-fat yogurt. Fill one fourth of your plate at each meal with low-fat (lean) proteins. Low-fat proteins include fish, chicken without skin, eggs, beans, and tofu. Avoid fatty meat, cured and processed meat, or chicken with skin. Avoid pre-made or processed food. Limit the amount of salt in your diet to less than 1,500 mg each day. Do not drink alcohol if: Your doctor tells you not to drink. You are pregnant, may be pregnant, or are planning to become pregnant. If you drink alcohol: Limit how much you have to: 0-1 drink a day for women. 0-2 drinks a day for men. Know how much alcohol is in your drink. In the [place] one drink equals one 12 oz bottle of beer (355 mL), one 5 oz glass of wine (148 mL), or one 1 oz glass of hard liquor (44 mL). Lifestyle  Work with your doctor to stay at a healthy weight or to lose weight. Ask your doctor what the best weight is for you. Get at least 30 minutes of exercise that causes your heart to beat faster (aerobic exercise) most days of the week. This may include walking, swimming, or biking. Get at least 30 minutes of exercise that strengthens your muscles (resistance exercise) at least 3 days a week. This may include lifting weights or doing Pilates. Do not smoke or use any products that contain nicotine or tobacco. If you need help quitting, ask your doctor. Check your blood pressure at home as told by your doctor. Keep all follow-up visits. Medicines Take over-the-counter and prescription medicines   only as told by your doctor. Follow directions carefully. Do not skip doses of blood pressure medicine. The medicine does not work as well if you skip doses. Skipping doses also puts you at risk for problems. Ask your doctor about side effects or reactions to medicines that you should watch  for. Contact a doctor if: You think you are having a reaction to the medicine you are taking. You have headaches that keep coming back. You feel dizzy. You have swelling in your ankles. You have trouble with your vision. Get help right away if: You get a very bad headache. You start to feel mixed up (confused). You feel weak or numb. You feel faint. You have very bad pain in your: Chest. Belly (abdomen). You vomit more than once. You have trouble breathing. These symptoms may be an emergency. Get help right away. Call 911. Do not wait to see if the symptoms will go away. Do not drive yourself to the hospital. Summary Hypertension is another name for high blood pressure. High blood pressure forces your heart to work harder to pump blood. For most people, a normal blood pressure is less than 120/80. Making healthy choices can help lower blood pressure. If your blood pressure does not get lower with healthy choices, you may need to take medicine. This information is not intended to replace advice given to you by your health care provider. Make sure you discuss any questions you have with your health care provider. Document Revised: 03/23/2021 Document Reviewed: 03/23/2021 Elsevier Patient Education  2024 Elsevier Inc.  

## 2022-12-20 LAB — CBC
Hematocrit: 39.7 % (ref 34.0–46.6)
Hemoglobin: 12.8 g/dL (ref 11.1–15.9)
MCH: 28.8 pg (ref 26.6–33.0)
MCHC: 32.2 g/dL (ref 31.5–35.7)
MCV: 89 fL (ref 79–97)
Platelets: 233 10*3/uL (ref 150–450)
RBC: 4.45 x10E6/uL (ref 3.77–5.28)
RDW: 13.1 % (ref 11.7–15.4)
WBC: 4.5 10*3/uL (ref 3.4–10.8)

## 2022-12-20 LAB — CMP14+EGFR
ALT: 9 IU/L (ref 0–32)
AST: 16 IU/L (ref 0–40)
Albumin: 4.3 g/dL (ref 3.7–4.7)
Alkaline Phosphatase: 63 IU/L (ref 44–121)
BUN/Creatinine Ratio: 20 (ref 12–28)
BUN: 18 mg/dL (ref 8–27)
Bilirubin Total: 0.5 mg/dL (ref 0.0–1.2)
CO2: 27 mmol/L (ref 20–29)
Calcium: 10 mg/dL (ref 8.7–10.3)
Chloride: 104 mmol/L (ref 96–106)
Creatinine, Ser: 0.89 mg/dL (ref 0.57–1.00)
Globulin, Total: 2.6 g/dL (ref 1.5–4.5)
Glucose: 85 mg/dL (ref 70–99)
Potassium: 5.1 mmol/L (ref 3.5–5.2)
Sodium: 142 mmol/L (ref 134–144)
Total Protein: 6.9 g/dL (ref 6.0–8.5)
eGFR: 64 mL/min/{1.73_m2} (ref >59)

## 2022-12-20 LAB — LIPID PANEL
Chol/HDL Ratio: 2.7 ratio (ref 0.0–4.4)
Cholesterol, Total: 170 mg/dL (ref 100–199)
HDL: 64 mg/dL (ref >39)
LDL Chol Calc (NIH): 95 mg/dL (ref 0–99)
Triglycerides: 57 mg/dL (ref 0–149)
VLDL Cholesterol Cal: 11 mg/dL (ref 5–40)

## 2022-12-20 LAB — HEMOGLOBIN A1C
Est. average glucose Bld gHb Est-mCnc: 126 mg/dL
Hgb A1c MFr Bld: 6 % — ABNORMAL HIGH (ref 4.8–5.6)

## 2022-12-20 LAB — HEPATITIS C ANTIBODY: Hep C Virus Ab: NONREACTIVE

## 2022-12-24 ENCOUNTER — Other Ambulatory Visit: Payer: Self-pay | Admitting: Internal Medicine

## 2022-12-24 DIAGNOSIS — I1310 Hypertensive heart and chronic kidney disease without heart failure, with stage 1 through stage 4 chronic kidney disease, or unspecified chronic kidney disease: Secondary | ICD-10-CM

## 2023-01-05 NOTE — Assessment & Plan Note (Signed)
Chronic, LDL goal < 70. CAC score >600.   She will c/w ASA 81mg , metoprolol XL 50mg  daily,

## 2023-01-05 NOTE — Assessment & Plan Note (Signed)
She is encouraged to strive for BMI less than 30 to decrease cardiac risk. Advised to aim for at least 150 minutes of exercise per week.  

## 2023-01-05 NOTE — Assessment & Plan Note (Signed)
Chronic, she is s/p Watchman procedure. No longer needs anticoagulation. She is currently on Bblocker therapy.

## 2023-01-05 NOTE — Assessment & Plan Note (Signed)
Chronic, controlled. She wil c/w amlodipine 5mg  and metoprolol XL 50mg  daily. She is encouraged to follow low sodium diet and to follow a heart healthy lifestyle.

## 2023-01-05 NOTE — Assessment & Plan Note (Signed)
Chronic, she is encouraged to avoid NSAIDS, stay hydrated and keep BP well controlled to decrease risk of CKD progression.

## 2023-01-05 NOTE — Assessment & Plan Note (Signed)
Previous labs reviewed, her A1c has been elevated in the past. I will check an A1c today. Reminded to avoid refined sugars including sugary drinks/foods and processed meats including bacon, sausages and deli meats.  

## 2023-01-22 DIAGNOSIS — R7303 Prediabetes: Secondary | ICD-10-CM | POA: Diagnosis not present

## 2023-01-22 DIAGNOSIS — H5203 Hypermetropia, bilateral: Secondary | ICD-10-CM | POA: Diagnosis not present

## 2023-01-22 DIAGNOSIS — H2513 Age-related nuclear cataract, bilateral: Secondary | ICD-10-CM | POA: Diagnosis not present

## 2023-01-22 DIAGNOSIS — H25013 Cortical age-related cataract, bilateral: Secondary | ICD-10-CM | POA: Diagnosis not present

## 2023-01-22 DIAGNOSIS — H401123 Primary open-angle glaucoma, left eye, severe stage: Secondary | ICD-10-CM | POA: Diagnosis not present

## 2023-01-22 DIAGNOSIS — H04123 Dry eye syndrome of bilateral lacrimal glands: Secondary | ICD-10-CM | POA: Diagnosis not present

## 2023-01-22 DIAGNOSIS — H524 Presbyopia: Secondary | ICD-10-CM | POA: Diagnosis not present

## 2023-01-22 LAB — HM DIABETES EYE EXAM

## 2023-01-25 ENCOUNTER — Telehealth (INDEPENDENT_AMBULATORY_CARE_PROVIDER_SITE_OTHER): Payer: Federal, State, Local not specified - PPO | Admitting: Internal Medicine

## 2023-01-25 ENCOUNTER — Encounter: Payer: Self-pay | Admitting: Internal Medicine

## 2023-01-25 DIAGNOSIS — U071 COVID-19: Secondary | ICD-10-CM

## 2023-01-25 DIAGNOSIS — E7800 Pure hypercholesterolemia, unspecified: Secondary | ICD-10-CM

## 2023-01-25 MED ORDER — NIRMATRELVIR/RITONAVIR (PAXLOVID)TABLET: 3.0000 | ORAL_TABLET | Freq: Two times a day (BID) | ORAL | 0 refills | Status: AC

## 2023-01-25 NOTE — Patient Instructions (Signed)

## 2023-01-25 NOTE — Progress Notes (Signed)
Virtual Visit via Video   This visit type was conducted due to national recommendations for restrictions regarding the COVID-19 Pandemic (e.g. social distancing) in an effort to limit this patient's exposure and mitigate transmission in our community.  Due to her co-morbid illnesses, this patient is at least at moderate risk for complications without adequate follow up.  This format is felt to be most appropriate for this patient at this time.  All issues noted in this document were discussed and addressed.  A limited physical exam was performed with this format.    This visit type was conducted due to national recommendations for restrictions regarding the COVID-19 Pandemic (e.g. social distancing) in an effort to limit this patient's exposure and mitigate transmission in our community.  Patients identity confirmed using two different identifiers.  This format is felt to be most appropriate for this patient at this time.  All issues noted in this document were discussed and addressed.  No physical exam was performed (except for noted visual exam findings with Video Visits).    Date:  02/02/2023   ID:  Kayla Calhoun, Kayla Calhoun March 29, 1941, MRN [id number]  Patient Location:  Home, with her daughter  Provider location:   Office    Chief Complaint:  "I have COVID"  History of Present Illness:    Kayla Calhoun is a 82 y.o. female who presents via video conferencing for a telehealth visit today.    The patient does have symptoms concerning for COVID-19 infection (fever, chills, cough, or new shortness of breath).   Patient presents virtually today for positive covid test today. She is accompanied by her daughter, [name] who is also a patient here. Pt states that she developed sneezing and runny nose on Wednesday. Today, she developed a fever of 103. She has been taking Coricidin and vitamin C.  Home COVID test is positive. She would like treatment.        Past Medical History:  Diagnosis  Date   Atrial fibrillation (HCC)    Glaucoma    Hypertension    Vitamin D deficiency    Past Surgical History:  Procedure Laterality Date   ABDOMINAL HYSTERECTOMY     KIDNEY SURGERY     LEFT ATRIAL APPENDAGE OCCLUSION N/A 12/22/2020   Procedure: LEFT ATRIAL APPENDAGE OCCLUSION;  Surgeon: Lanier Prude, MD;  Location: [facility] INVASIVE CV LAB;  Service: Cardiovascular;  Laterality: N/A;   TEE WITHOUT CARDIOVERSION N/A 12/22/2020   Procedure: TRANSESOPHAGEAL ECHOCARDIOGRAM (TEE);  Surgeon: Lanier Prude, MD;  Location: [facility] INVASIVE CV LAB;  Service: Cardiovascular;  Laterality: N/A;   TEE WITHOUT CARDIOVERSION N/A 02/01/2021   Procedure: TRANSESOPHAGEAL ECHOCARDIOGRAM (TEE);  Surgeon: Thurmon Fair, MD;  Location: [facility] ENDOSCOPY;  Service: Cardiovascular;  Laterality: N/A;   TONSILLECTOMY       Current Meds  Medication Sig   ALPHAGAN P 0.1 % SOLN Place 1 drop into both eyes in the morning and at bedtime.   amLODipine (NORVASC) 5 MG tablet Take 1 tablet (5 mg total) by mouth daily.   Ascorbic Acid (VITAMIN C) 1000 MG tablet Take 1,000 mg by mouth every other day. In the morning   ASPIRIN LOW DOSE 81 MG EC tablet TAKE 1 TABLET (81 MG TOTAL) BY MOUTH DAILY. SWALLOW WHOLE.   Cholecalciferol (VITAMIN D3) 50 MCG (2000 UT) TABS Take 2,000 Units by mouth daily.   levobunolol (BETAGAN) 0.5 % ophthalmic solution Place 1 drop into both eyes 2 (two) times daily.   LUMIGAN 0.01 %  SOLN SMARTSIG:1 Drop(s) In Eye(s) Every Evening   metoprolol succinate (TOPROL-XL) 50 MG 24 hr tablet TAKE 1 TABLET BY MOUTH EVERY DAY WITH OR IMMEDIATELY FOLLOWING A MEAL   [EXPIRED] nirmatrelvir/ritonavir (PAXLOVID) 20 x 150 MG & 10 x 100MG  TABS Take 3 tablets by mouth 2 (two) times daily for 5 days. Patient GFR is 64.   Noni, [place] citrifolia, (NONI JUICE PO) Take 60 mLs by mouth every other day.   TRAVATAN Z 0.004 % SOLN ophthalmic solution Place 1 drop into both eyes at bedtime.     Allergies:   Codeine, Sulfa  antibiotics, and Contrast media [iodinated contrast media]   Social History   Tobacco Use   Smoking status: Former    Types: Cigarettes   Smokeless tobacco: Never   Tobacco comments:    been quit 35 years  Vaping Use   Vaping status: Never Used  Substance Use Topics   Alcohol use: Not Currently   Drug use: Never     Family Hx: The patient's family history includes Hypertension in her brother and sister; Prostate cancer in her father; Stroke in her brother, mother, and sister.  ROS:   Please see the history of present illness.    Review of Systems  Constitutional:  Positive for fever and malaise/fatigue.  HENT:  Positive for congestion.   Respiratory: Negative.    Gastrointestinal: Negative.   Genitourinary: Negative.   Neurological: Negative.   Endo/Heme/Allergies: Negative.   Psychiatric/Behavioral: Negative.      All other systems reviewed and are negative.   Labs/Other Tests and Data Reviewed:    Recent Labs: 12/19/2022: ALT 9; BUN 18; Creatinine, Ser 0.89; Hemoglobin 12.8; Platelets 233; Potassium 5.1; Sodium 142   Recent Lipid Panel Lab Results  Component Value Date/Time   CHOL 170 12/19/2022 12:12 PM   TRIG 57 12/19/2022 12:12 PM   HDL 64 12/19/2022 12:12 PM   CHOLHDL 2.7 12/19/2022 12:12 PM   LDLCALC 95 12/19/2022 12:12 PM    Wt Readings from Last 3 Encounters:  12/19/22 177 lb (80.3 kg)  12/19/22 177 lb 9.6 oz (80.6 kg)  09/26/22 185 lb 3.2 oz (84 kg)     Exam:    Vital Signs:  There were no vitals taken for this visit.    Physical Exam Vitals and nursing note reviewed.  Constitutional:      Appearance: She is ill-appearing.  HENT:     Head: Normocephalic and atraumatic.  Eyes:     Extraocular Movements: Extraocular movements intact.  Pulmonary:     Effort: Pulmonary effort is normal.     Comments: Able to speak in full sentences Musculoskeletal:     Cervical back: Normal range of motion.  Neurological:     Mental Status: She is alert  and oriented to person, place, and time.  Psychiatric:        Mood and Affect: Affect normal.     ASSESSMENT & PLAN:    COVID Assessment & Plan:  She would like treatment, rx paxlovid sent to her pharmacy. Advised to take full course, possible side effects d/w patient. I will also refer her for home monitoring/temperature monitoring program. She is encouraged to email me daily on Mychart to let me know how she is doing. She is encouraged to go to ER should she develop worsening SOB. She is also advised to stay well hydrated, move periodically throughout the day and to have a hot beverage daily.  She verbalizes understanding of her treatment plan. All  questions were answered to her satisfaction. She understands that she needs to continue to self quarantine.   Orders: -     MyChart Temperature FLOWSHEET; Future  Pure hypercholesterolemia Assessment & Plan: Chronic, pt is not on statin therapy.    Other orders -     [facility] COVID-19 HOME MONITORING PROGRAM; Future -     nirmatrelvir/ritonavir; Take 3 tablets by mouth 2 (two) times daily for 5 days. Patient GFR is 64.  Dispense: 30 tablet; Refill: 0     COVID-19 Education: The signs and symptoms of COVID-19 were discussed with the patient and how to seek care for testing (follow up with PCP or arrange E-visit).  The importance of social distancing was discussed today.  Patient Risk:   After full review of this patients clinical status, I feel that they are at least moderate risk at this time.  Time:   Today, I have spent 12 minutes/ seconds with the patient with telehealth technology discussing above diagnoses.     Medication Adjustments/Labs and Tests Ordered: Current medicines are reviewed at length with the patient today.  Concerns regarding medicines are outlined above.   Tests Ordered: No orders of the defined types were placed in this encounter.   Medication Changes: Meds ordered this encounter  Medications    nirmatrelvir/ritonavir (PAXLOVID) 20 x 150 MG & 10 x 100MG  TABS    Sig: Take 3 tablets by mouth 2 (two) times daily for 5 days. Patient GFR is 64.    Dispense:  30 tablet    Refill:  0    Disposition:  Follow up prn  Signed, Gwynneth Aliment, MD

## 2023-02-02 DIAGNOSIS — U071 COVID-19: Secondary | ICD-10-CM | POA: Insufficient documentation

## 2023-02-02 DIAGNOSIS — E7800 Pure hypercholesterolemia, unspecified: Secondary | ICD-10-CM | POA: Insufficient documentation

## 2023-02-02 NOTE — Assessment & Plan Note (Signed)
Chronic, pt is not on statin therapy.

## 2023-02-02 NOTE — Assessment & Plan Note (Signed)
She would like treatment, rx paxlovid sent to her pharmacy. Advised to take full course, possible side effects d/w patient. I will also refer her for home monitoring/temperature monitoring program. She is encouraged to email me daily on Mychart to let me know how she is doing. She is encouraged to go to ER should she develop worsening SOB. She is also advised to stay well hydrated, move periodically throughout the day and to have a hot beverage daily.  She verbalizes understanding of her treatment plan. All questions were answered to her satisfaction. She understands that she needs to continue to self quarantine.

## 2023-02-11 ENCOUNTER — Telehealth: Payer: Self-pay | Admitting: Cardiology

## 2023-02-11 NOTE — Telephone Encounter (Signed)
Called patient's daughter (DPR) back. Patient is having cataract surgery and she wanted to know if it was okay to take metoprolol. Informed her that the surgeon would let them know what to stop or hold and how long. She stated that they told them it was okay to take all her heart medications. Informed her that most times they are concerned with blood thinners. She stated they told her it was okay to take her baby aspirin, but she was going to hold it for a day any ways. Patient's daughter verbalized understanding and thank [place] for the call back.

## 2023-02-11 NOTE — Telephone Encounter (Signed)
Patient's daughter is calling to make sure it is ok for the patient to take her medication metoprolol succinate (TOPROL-XL) 50 MG 24 hr tablet  before her surgery on Thursday. Please call back to discuss

## 2023-02-14 DIAGNOSIS — H25011 Cortical age-related cataract, right eye: Secondary | ICD-10-CM | POA: Diagnosis not present

## 2023-02-14 DIAGNOSIS — H2511 Age-related nuclear cataract, right eye: Secondary | ICD-10-CM | POA: Diagnosis not present

## 2023-02-14 DIAGNOSIS — H25811 Combined forms of age-related cataract, right eye: Secondary | ICD-10-CM | POA: Diagnosis not present

## 2023-03-07 DIAGNOSIS — H2512 Age-related nuclear cataract, left eye: Secondary | ICD-10-CM | POA: Diagnosis not present

## 2023-03-07 DIAGNOSIS — H25012 Cortical age-related cataract, left eye: Secondary | ICD-10-CM | POA: Diagnosis not present

## 2023-03-07 DIAGNOSIS — H25812 Combined forms of age-related cataract, left eye: Secondary | ICD-10-CM | POA: Diagnosis not present

## 2023-03-09 ENCOUNTER — Other Ambulatory Visit: Payer: Self-pay | Admitting: Internal Medicine

## 2023-03-09 DIAGNOSIS — I1310 Hypertensive heart and chronic kidney disease without heart failure, with stage 1 through stage 4 chronic kidney disease, or unspecified chronic kidney disease: Secondary | ICD-10-CM

## 2023-03-18 ENCOUNTER — Other Ambulatory Visit: Payer: Self-pay | Admitting: Cardiology

## 2023-03-25 ENCOUNTER — Ambulatory Visit: Payer: HMO | Attending: Cardiology | Admitting: Cardiology

## 2023-03-25 ENCOUNTER — Encounter: Payer: Self-pay | Admitting: Cardiology

## 2023-03-25 VITALS — BP 120/78 | HR 83 | Ht 62.0 in | Wt 177.0 lb

## 2023-03-25 DIAGNOSIS — I2583 Coronary atherosclerosis due to lipid rich plaque: Secondary | ICD-10-CM

## 2023-03-25 DIAGNOSIS — I4821 Permanent atrial fibrillation: Secondary | ICD-10-CM | POA: Diagnosis not present

## 2023-03-25 DIAGNOSIS — Z79899 Other long term (current) drug therapy: Secondary | ICD-10-CM | POA: Diagnosis not present

## 2023-03-25 DIAGNOSIS — I10 Essential (primary) hypertension: Secondary | ICD-10-CM

## 2023-03-25 DIAGNOSIS — I340 Nonrheumatic mitral (valve) insufficiency: Secondary | ICD-10-CM | POA: Diagnosis not present

## 2023-03-25 MED ORDER — ROSUVASTATIN CALCIUM 10 MG PO TABS: 10.0000 mg | ORAL_TABLET | Freq: Every day | ORAL | 3 refills | Status: DC

## 2023-03-25 NOTE — Patient Instructions (Signed)
Medication Instructions:  Please start Crestor 10 mg daily. Continue all other medications as listed.  *If you need a refill on your cardiac medications before your next appointment, please call your pharmacy*   Lab Work: Please have Lipid panel in 2 months.  If you have labs (blood work) drawn today and your tests are completely normal, you will receive your results only by: MyChart Message (if you have MyChart) OR A paper copy in the mail If you have any lab test that is abnormal or we need to change your treatment, we will call you to review the results.  Follow-Up: At [facility], you and your health needs are our priority.  As part of our continuing mission to provide you with exceptional heart care, we have created designated Provider Care Teams.  These Care Teams include your primary Cardiologist (physician) and Advanced Practice Providers (APPs -  Physician Assistants and Nurse Practitioners) who all work together to provide you with the care you need, when you need it.  We recommend signing up for the patient portal called "MyChart".  Sign up information is provided on this After Visit Summary.  MyChart is used to connect with patients for Virtual Visits (Telemedicine).  Patients are able to view lab/test results, encounter notes, upcoming appointments, etc.  Non-urgent messages can be sent to your provider as well.   To learn more about what you can do with MyChart, go to [email or web address].    Your next appointment:   1 year(s)  Provider:   Donato Schultz, MD

## 2023-03-25 NOTE — Progress Notes (Signed)
Cardiology Office Note:  .   Date:  03/25/2023  ID:  Kayla Calhoun, DOB Sep 23, 1940, MRN [id number] PCP: Dorothyann Peng, MD  [facility] Providers Cardiologist:  Donato Schultz, MD Electrophysiologist:  Lanier Prude, MD     History of Present Illness: .   Kayla Calhoun is a 82 y.o. female Discussed with the use of AI scribe software   History of Present Illness   The patient is an 82 year old with a history of atrial fibrillation, hypertension, and a remote subarachnoid bleed. She had a Watchman device implanted two years ago to avoid the need for anticoagulation. An echocardiogram performed a month ago showed normal pump function, mild to moderate mitral valve regurgitation, mild aortic valve stenosis, and a chronic small pericardial effusion. A pre-ablation cardiac CT from two years ago revealed a coronary calcium score of 692, placing her in the 90th percentile.  The patient reports feeling generally well, with occasional brief episodes of chest discomfort described as a 'sting.' These episodes began after a recent viral illness and are not worsening. She is currently on metoprolol succinate 50 mg daily for rate control of her atrial fibrillation, which is reported to be effective.  The patient also has a history of hypertension, which is well controlled with her current medication regimen. She is also on amlodipine 5 mg for blood pressure control. She has been prescribed rosuvastatin for plaque identified on a prior scan. The patient is active and enjoys spending time in the yard.   Daughter is here patient of ours.           Studies Reviewed: Marland Kitchen   EKG Interpretation Date/Time:  Monday March 25 2023 08:23:49 EDT Ventricular Rate:  83 PR Interval:    QRS Duration:  70 QT Interval:  360 QTC Calculation: 423 R Axis:   20  Text Interpretation: Atrial fibrillation Low voltage QRS Septal infarct (cited on or before 20-Dec-2020) When compared with ECG of  25-Jan-2021 13:51, No significant change was found Confirmed by Donato Schultz ([id number]) on 03/25/2023 8:35:21 AM    LDL 95 in 2024  Risk Assessment/Calculations:    CHA2DS2-VASc Score = 4   This indicates a 4.8% annual risk of stroke. The patient's score is based upon: CHF History: 0 HTN History: 1 Diabetes History: 0 Stroke History: 0 Vascular Disease History: 0 Age Score: 2 Gender Score: 1            Physical Exam:   VS:  BP 120/78   Pulse 83   Ht 5\' 2"  (1.575 m)   Wt 177 lb (80.3 kg)   SpO2 98%   BMI 32.37 kg/m    Wt Readings from Last 3 Encounters:  03/25/23 177 lb (80.3 kg)  12/19/22 177 lb (80.3 kg)  12/19/22 177 lb 9.6 oz (80.6 kg)    GEN: Well nourished, well developed in no acute distress NECK: No JVD; No carotid bruits CARDIAC: IRRR, soft systolic murmur, no rubs, no gallops RESPIRATORY:  Clear to auscultation without rales, wheezing or rhonchi  ABDOMEN: Soft, non-tender, non-distended EXTREMITIES:  No edema; No deformity   ASSESSMENT AND PLAN: .    Assessment and Plan    Atrial Fibrillation Permanent atrial fibrillation with good rate control on Metoprolol Succinate 50mg  daily. Watchman device in place due to prior subarachnoid bleed, allowing for avoidance of anticoagulation. -Continue current management.  Coronary Artery Disease High coronary calcium score (692, 90th percentile) on prior cardiac CT. Patient reports occasional chest discomfort, but not worsening.  Currently not on statin therapy. -Start Rosuvastatin 10mg  daily. -Check lipid panel in 2 months.  Mitral Regurgitation and Aortic Stenosis Mild to moderate mitral regurgitation and mild aortic stenosis on recent echocardiogram. No symptoms reported. -Continue monitoring.  Hypertension Well controlled on Amlodipine 5mg  daily. -Continue current management.  Follow-up in 1 year, unless issues arise.               Signed, Donato Schultz, MD

## 2023-04-22 ENCOUNTER — Other Ambulatory Visit: Payer: Self-pay | Admitting: Internal Medicine

## 2023-05-23 ENCOUNTER — Ambulatory Visit: Payer: Federal, State, Local not specified - PPO | Admitting: Internal Medicine

## 2023-05-23 NOTE — Patient Instructions (Incomplete)
Hypertension, Adult Hypertension is another name for high blood pressure. High blood pressure forces your heart to work harder to pump blood. This can cause problems over time. There are two numbers in a blood pressure reading. There is a top number (systolic) over a bottom number (diastolic). It is best to have a blood pressure that is below 120/80. What are the causes? The cause of this condition is not known. Some other conditions can lead to high blood pressure. What increases the risk? Some lifestyle factors can make you more likely to develop high blood pressure: Smoking. Not getting enough exercise or physical activity. Being overweight. Having too much fat, sugar, calories, or salt (sodium) in your diet. Drinking too much alcohol. Other risk factors include: Having any of these conditions: Heart disease. Diabetes. High cholesterol. Kidney disease. Obstructive sleep apnea. Having a family history of high blood pressure and high cholesterol. Age. The risk increases with age. Stress. What are the signs or symptoms? High blood pressure may not cause symptoms. Very high blood pressure (hypertensive crisis) may cause: Headache. Fast or uneven heartbeats (palpitations). Shortness of breath. Nosebleed. Vomiting or feeling like you may vomit (nauseous). Changes in how you see. Very bad chest pain. Feeling dizzy. Seizures. How is this treated? This condition is treated by making healthy lifestyle changes, such as: Eating healthy foods. Exercising more. Drinking less alcohol. Your doctor may prescribe medicine if lifestyle changes do not help enough and if: Your top number is above 130. Your bottom number is above 80. Your personal target blood pressure may vary. Follow these instructions at home: Eating and drinking  If told, follow the DASH eating plan. To follow this plan: Fill one half of your plate at each meal with fruits and vegetables. Fill one fourth of your plate  at each meal with whole grains. Whole grains include whole-wheat pasta, brown rice, and whole-grain bread. Eat or drink low-fat dairy products, such as skim milk or low-fat yogurt. Fill one fourth of your plate at each meal with low-fat (lean) proteins. Low-fat proteins include fish, chicken without skin, eggs, beans, and tofu. Avoid fatty meat, cured and processed meat, or chicken with skin. Avoid pre-made or processed food. Limit the amount of salt in your diet to less than 1,500 mg each day. Do not drink alcohol if: Your doctor tells you not to drink. You are pregnant, may be pregnant, or are planning to become pregnant. If you drink alcohol: Limit how much you have to: 0-1 drink a day for women. 0-2 drinks a day for men. Know how much alcohol is in your drink. In the [place] one drink equals one 12 oz bottle of beer (355 mL), one 5 oz glass of wine (148 mL), or one 1 oz glass of hard liquor (44 mL). Lifestyle  Work with your doctor to stay at a healthy weight or to lose weight. Ask your doctor what the best weight is for you. Get at least 30 minutes of exercise that causes your heart to beat faster (aerobic exercise) most days of the week. This may include walking, swimming, or biking. Get at least 30 minutes of exercise that strengthens your muscles (resistance exercise) at least 3 days a week. This may include lifting weights or doing Pilates. Do not smoke or use any products that contain nicotine or tobacco. If you need help quitting, ask your doctor. Check your blood pressure at home as told by your doctor. Keep all follow-up visits. Medicines Take over-the-counter and prescription medicines   only as told by your doctor. Follow directions carefully. Do not skip doses of blood pressure medicine. The medicine does not work as well if you skip doses. Skipping doses also puts you at risk for problems. Ask your doctor about side effects or reactions to medicines that you should watch  for. Contact a doctor if: You think you are having a reaction to the medicine you are taking. You have headaches that keep coming back. You feel dizzy. You have swelling in your ankles. You have trouble with your vision. Get help right away if: You get a very bad headache. You start to feel mixed up (confused). You feel weak or numb. You feel faint. You have very bad pain in your: Chest. Belly (abdomen). You vomit more than once. You have trouble breathing. These symptoms may be an emergency. Get help right away. Call 911. Do not wait to see if the symptoms will go away. Do not drive yourself to the hospital. Summary Hypertension is another name for high blood pressure. High blood pressure forces your heart to work harder to pump blood. For most people, a normal blood pressure is less than 120/80. Making healthy choices can help lower blood pressure. If your blood pressure does not get lower with healthy choices, you may need to take medicine. This information is not intended to replace advice given to you by your health care provider. Make sure you discuss any questions you have with your health care provider. Document Revised: 03/23/2021 Document Reviewed: 03/23/2021 Elsevier Patient Education  2024 Elsevier Inc.  

## 2023-05-23 NOTE — Progress Notes (Unsigned)
I,Melbert Botelho T Deloria Lair, CMA,acting as a Neurosurgeon for Gwynneth Aliment, MD.,have documented all relevant documentation on the behalf of Gwynneth Aliment, MD,as directed by  Gwynneth Aliment, MD while in the presence of Gwynneth Aliment, MD.  Subjective:  Patient ID: Kayla Calhoun , female    DOB: Apr 12, 1941 , 82 y.o.   MRN: [id number]  No chief complaint on file.   HPI  She presents today for BP check. She reports compliance with meds. She denies having any issues with headaches, chest pain and shortness of breath. She has no specific concerns or complaints at this time.        Hypertension This is a chronic problem. The current episode started more than 1 year ago. The problem has been gradually improving since onset. The problem is controlled. Pertinent negatives include no blurred vision, chest pain, palpitations or shortness of breath. Risk factors for coronary artery disease include obesity, sedentary lifestyle and post-menopausal state. Past treatments include calcium channel blockers, beta blockers and diuretics. The current treatment provides mild improvement. Hypertensive end-organ damage includes kidney disease.     Past Medical History:  Diagnosis Date   Atrial fibrillation (HCC)    Glaucoma    Hypertension    Vitamin D deficiency      Family History  Problem Relation Age of Onset   Stroke Mother    Prostate cancer Father    Stroke Sister    Hypertension Sister    Hypertension Brother    Stroke Brother      Current Outpatient Medications:    ALPHAGAN P 0.1 % SOLN, Place 1 drop into both eyes in the morning and at bedtime., Disp: , Rfl:    amLODipine (NORVASC) 5 MG tablet, Take 1 tablet (5 mg total) by mouth daily., Disp: 90 tablet, Rfl: 2   Ascorbic Acid (VITAMIN C) 1000 MG tablet, Take 1,000 mg by mouth every other day. In the morning, Disp: , Rfl:    ASPIRIN LOW DOSE 81 MG EC tablet, TAKE 1 TABLET (81 MG TOTAL) BY MOUTH DAILY. SWALLOW WHOLE., Disp: 30 tablet, Rfl:  11   cetirizine (ZYRTEC) 10 MG tablet, Take 5 mg by mouth daily as needed for allergies., Disp: , Rfl:    Cholecalciferol (VITAMIN D3) 50 MCG (2000 UT) TABS, Take 2,000 Units by mouth daily., Disp: , Rfl:    ketorolac (ACULAR) 0.5 % ophthalmic solution, Place 1 drop into the left eye 4 (four) times daily., Disp: , Rfl:    levobunolol (BETAGAN) 0.5 % ophthalmic solution, Place 1 drop into both eyes 2 (two) times daily., Disp: , Rfl:    LUMIGAN 0.01 % SOLN, SMARTSIG:1 Drop(s) In Eye(s) Every Evening, Disp: , Rfl:    metoprolol succinate (TOPROL-XL) 50 MG 24 hr tablet, TAKE 1 TABLET BY MOUTH EVERY DAY WITH OR IMMEDIATELY FOLLOWING A MEAL, Disp: 90 tablet, Rfl: 1   moxifloxacin (VIGAMOX) 0.5 % ophthalmic solution, Place 1 drop into the left eye 4 (four) times daily., Disp: , Rfl:    Noni, [place] citrifolia, (NONI JUICE PO), Take 60 mLs by mouth every other day., Disp: , Rfl:    prednisoLONE acetate (PRED FORTE) 1 % ophthalmic suspension, Place 1 drop into the left eye 4 (four) times daily., Disp: , Rfl:    rosuvastatin (CRESTOR) 10 MG tablet, Take 1 tablet (10 mg total) by mouth daily., Disp: 90 tablet, Rfl: 3   TRAVATAN Z 0.004 % SOLN ophthalmic solution, Place 1 drop into both eyes at bedtime., Disp: ,  Rfl:    triamterene-hydrochlorothiazide (MAXZIDE-25) 37.5-25 MG tablet, TAKE 1/2 TABLET BY MOUTH DAILY, Disp: 90 tablet, Rfl: 1   vitamin E 180 MG (400 UNITS) capsule, Take 400 Units by mouth 2 (two) times a week., Disp: , Rfl:    Allergies  Allergen Reactions   Codeine Other (See Comments)   Sulfa Antibiotics Other (See Comments)   Contrast Media [Iodinated Contrast Media] Rash    Skin peeled (head to heel)     Review of Systems  Constitutional: Negative.   Eyes:  Negative for blurred vision.  Respiratory: Negative.  Negative for shortness of breath.   Cardiovascular: Negative.  Negative for chest pain and palpitations.  Neurological: Negative.   Psychiatric/Behavioral: Negative.        There were no vitals filed for this visit. There is no height or weight on file to calculate BMI.  Wt Readings from Last 3 Encounters:  03/25/23 177 lb (80.3 kg)  12/19/22 177 lb (80.3 kg)  12/19/22 177 lb 9.6 oz (80.6 kg)     Objective:  Physical Exam      Assessment And Plan:  Hypertensive heart and renal disease with renal failure, stage 1 through stage 4 or unspecified chronic kidney disease, without heart failure  Pure hypercholesterolemia  Coronary atherosclerosis due to lipid rich plaque  Chronic renal disease, stage II  Longstanding persistent atrial fibrillation (HCC)  Other abnormal glucose     No follow-ups on file.  Patient was given opportunity to ask questions. Patient verbalized understanding of the plan and was able to repeat key elements of the plan. All questions were answered to their satisfaction.  Gwynneth Aliment, MD  I, Gwynneth Aliment, MD, have reviewed all documentation for this visit. The documentation on 05/23/23 for the exam, diagnosis, procedures, and orders are all accurate and complete.   IF YOU HAVE BEEN REFERRED TO A SPECIALIST, IT MAY TAKE 1-2 WEEKS TO SCHEDULE/PROCESS THE REFERRAL. IF YOU HAVE NOT HEARD FROM US/SPECIALIST IN TWO WEEKS, PLEASE GIVE [place] A CALL AT [phone] X [phone].   THE PATIENT IS ENCOURAGED TO PRACTICE SOCIAL DISTANCING DUE TO THE COVID-19 PANDEMIC.

## 2023-05-24 ENCOUNTER — Encounter: Payer: Self-pay | Admitting: *Deleted

## 2023-05-27 ENCOUNTER — Ambulatory Visit: Payer: HMO

## 2023-05-27 DIAGNOSIS — I2583 Coronary atherosclerosis due to lipid rich plaque: Secondary | ICD-10-CM | POA: Diagnosis not present

## 2023-05-27 DIAGNOSIS — Z79899 Other long term (current) drug therapy: Secondary | ICD-10-CM

## 2023-05-28 LAB — LIPID PANEL
Chol/HDL Ratio: 2 {ratio} (ref 0.0–4.4)
Cholesterol, Total: 131 mg/dL (ref 100–199)
HDL: 66 mg/dL (ref >39)
LDL Chol Calc (NIH): 55 mg/dL (ref 0–99)
Triglycerides: 41 mg/dL (ref 0–149)
VLDL Cholesterol Cal: 10 mg/dL (ref 5–40)

## 2023-06-20 DIAGNOSIS — H401123 Primary open-angle glaucoma, left eye, severe stage: Secondary | ICD-10-CM | POA: Diagnosis not present

## 2023-08-19 ENCOUNTER — Ambulatory Visit (HOSPITAL_COMMUNITY): Payer: Medicare Other | Attending: Cardiology

## 2023-08-19 DIAGNOSIS — I340 Nonrheumatic mitral (valve) insufficiency: Secondary | ICD-10-CM | POA: Diagnosis not present

## 2023-08-19 DIAGNOSIS — I4821 Permanent atrial fibrillation: Secondary | ICD-10-CM | POA: Diagnosis not present

## 2023-08-19 DIAGNOSIS — Z95818 Presence of other cardiac implants and grafts: Secondary | ICD-10-CM | POA: Diagnosis not present

## 2023-08-19 DIAGNOSIS — Z8679 Personal history of other diseases of the circulatory system: Secondary | ICD-10-CM | POA: Insufficient documentation

## 2023-08-19 DIAGNOSIS — I10 Essential (primary) hypertension: Secondary | ICD-10-CM | POA: Insufficient documentation

## 2023-08-19 LAB — ECHOCARDIOGRAM COMPLETE
MV M vel: 4.58 m/s
MV Peak grad: 84 mmHg
P 1/2 time: 461 ms
S' Lateral: 2.5 cm

## 2023-09-03 ENCOUNTER — Ambulatory Visit: Payer: Self-pay | Admitting: Internal Medicine

## 2023-09-03 NOTE — Telephone Encounter (Signed)
 Chief Complaint: Dizzy Symptoms: Moderate Dizziness, blurred vision Frequency: Comes and goes  Pertinent Negatives: Patient denies headache, elevated blood pressure, feeling faint Disposition: [] ED /[] Urgent Care (no appt availability in office) / [x] Appointment(In office/virtual)/ []  [place] [facility]/ [] Home Care/ [] Refused Recommended Disposition /[] [place] [facility]/ []  Follow-up with PCP Additional Notes: Patient states she started waking up feeling dizzy especially when she goes from sitting to standing and has blurred vision. Patient states it only hast for a short period of time but it has been reoccurring. Patient reports a history of A-Fib. Care advice was given and an appointment was scheduled with PCP tomorrow.  Copied from CRM [id number]. Topic: Clinical - Red Word Triage >> Sep 03, 2023 10:56 AM Kayla Calhoun wrote: Red Word that prompted transfer to Nurse Triage: patient has been having dizzy spells and blurred vision when she wake up. Reason for Disposition  [1] MODERATE dizziness (e.g., interferes with normal activities) AND [2] has NOT been evaluated by doctor (or NP/PA) for this  (Exception: Dizziness caused by heat exposure, sudden standing, or poor fluid intake.)  Answer Assessment - Initial Assessment Questions 1. DESCRIPTION: "Describe your dizziness."     Very dizzy with blurred vision  2. LIGHTHEADED: "Do you feel lightheaded?" (e.g., somewhat faint, woozy, weak upon standing)     Somewhat faint  3. VERTIGO: "Do you feel like either you or the room is spinning or tilting?" (i.e. vertigo)     No 4. SEVERITY: "How bad is it?"  "Do you feel like you are going to faint?" "Can you stand and walk?"   - MILD: Feels slightly dizzy, but walking normally.   - MODERATE: Feels unsteady when walking, but not falling; interferes with normal activities (e.g., school, work).   - SEVERE: Unable to walk without falling, or requires assistance to walk without falling; feels like  passing out now.      Moderate 5. ONSET:  "When did the dizziness begin?"     Last week  6. AGGRAVATING FACTORS: "Does anything make it worse?" (e.g., standing, change in head position)     Standing up   8. CAUSE: "What do you think is causing the dizziness?"     I'm not sure  9. RECURRENT SYMPTOM: "Have you had dizziness before?" If Yes, ask: "When was the last time?" "What happened that time?"     No  10. OTHER SYMPTOMS: "Do you have any other symptoms?" (e.g., fever, chest pain, vomiting, diarrhea, bleeding)       Blurred vision  Protocols used: Dizziness - Lightheadedness-A-AH

## 2023-09-04 ENCOUNTER — Encounter: Payer: Self-pay | Admitting: Internal Medicine

## 2023-09-04 ENCOUNTER — Ambulatory Visit (INDEPENDENT_AMBULATORY_CARE_PROVIDER_SITE_OTHER): Payer: Self-pay | Admitting: Internal Medicine

## 2023-09-04 VITALS — BP 118/70 | HR 94 | Temp 98.5°F | Ht 62.0 in | Wt 176.0 lb

## 2023-09-04 DIAGNOSIS — I2583 Coronary atherosclerosis due to lipid rich plaque: Secondary | ICD-10-CM

## 2023-09-04 DIAGNOSIS — H538 Other visual disturbances: Secondary | ICD-10-CM | POA: Diagnosis not present

## 2023-09-04 DIAGNOSIS — R42 Dizziness and giddiness: Secondary | ICD-10-CM | POA: Diagnosis not present

## 2023-09-04 DIAGNOSIS — I4821 Permanent atrial fibrillation: Secondary | ICD-10-CM

## 2023-09-04 DIAGNOSIS — Z6832 Body mass index (BMI) 32.0-32.9, adult: Secondary | ICD-10-CM

## 2023-09-04 DIAGNOSIS — E6609 Other obesity due to excess calories: Secondary | ICD-10-CM

## 2023-09-04 DIAGNOSIS — I1310 Hypertensive heart and chronic kidney disease without heart failure, with stage 1 through stage 4 chronic kidney disease, or unspecified chronic kidney disease: Secondary | ICD-10-CM

## 2023-09-04 DIAGNOSIS — Z79899 Other long term (current) drug therapy: Secondary | ICD-10-CM | POA: Diagnosis not present

## 2023-09-04 DIAGNOSIS — E66811 Obesity, class 1: Secondary | ICD-10-CM

## 2023-09-04 DIAGNOSIS — Z95811 Presence of heart assist device: Secondary | ICD-10-CM | POA: Insufficient documentation

## 2023-09-04 NOTE — Patient Instructions (Signed)
 Hypertension, Adult Hypertension is another name for high blood pressure. High blood pressure forces your heart to work harder to pump blood. This can cause problems over time. There are two numbers in a blood pressure reading. There is a top number (systolic) over a bottom number (diastolic). It is best to have a blood pressure that is below 120/80. What are the causes? The cause of this condition is not known. Some other conditions can lead to high blood pressure. What increases the risk? Some lifestyle factors can make you more likely to develop high blood pressure: Smoking. Not getting enough exercise or physical activity. Being overweight. Having too much fat, sugar, calories, or salt (sodium) in your diet. Drinking too much alcohol. Other risk factors include: Having any of these conditions: Heart disease. Diabetes. High cholesterol. Kidney disease. Obstructive sleep apnea. Having a family history of high blood pressure and high cholesterol. Age. The risk increases with age. Stress. What are the signs or symptoms? High blood pressure may not cause symptoms. Very high blood pressure (hypertensive crisis) may cause: Headache. Fast or uneven heartbeats (palpitations). Shortness of breath. Nosebleed. Vomiting or feeling like you may vomit (nauseous). Changes in how you see. Very bad chest pain. Feeling dizzy. Seizures. How is this treated? This condition is treated by making healthy lifestyle changes, such as: Eating healthy foods. Exercising more. Drinking less alcohol. Your doctor may prescribe medicine if lifestyle changes do not help enough and if: Your top number is above 130. Your bottom number is above 80. Your personal target blood pressure may vary. Follow these instructions at home: Eating and drinking  If told, follow the DASH eating plan. To follow this plan: Fill one half of your plate at each meal with fruits and vegetables. Fill one fourth of your plate  at each meal with whole grains. Whole grains include whole-wheat pasta, brown rice, and whole-grain bread. Eat or drink low-fat dairy products, such as skim milk or low-fat yogurt. Fill one fourth of your plate at each meal with low-fat (lean) proteins. Low-fat proteins include fish, chicken without skin, eggs, beans, and tofu. Avoid fatty meat, cured and processed meat, or chicken with skin. Avoid pre-made or processed food. Limit the amount of salt in your diet to less than 1,500 mg each day. Do not drink alcohol if: Your doctor tells you not to drink. You are pregnant, may be pregnant, or are planning to become pregnant. If you drink alcohol: Limit how much you have to: 0-1 drink a day for women. 0-2 drinks a day for men. Know how much alcohol is in your drink. In the [place] one drink equals one 12 oz bottle of beer (355 mL), one 5 oz glass of wine (148 mL), or one 1 oz glass of hard liquor (44 mL). Lifestyle  Work with your doctor to stay at a healthy weight or to lose weight. Ask your doctor what the best weight is for you. Get at least 30 minutes of exercise that causes your heart to beat faster (aerobic exercise) most days of the week. This may include walking, swimming, or biking. Get at least 30 minutes of exercise that strengthens your muscles (resistance exercise) at least 3 days a week. This may include lifting weights or doing Pilates. Do not smoke or use any products that contain nicotine or tobacco. If you need help quitting, ask your doctor. Check your blood pressure at home as told by your doctor. Keep all follow-up visits. Medicines Take over-the-counter and prescription medicines  only as told by your doctor. Follow directions carefully. Do not skip doses of blood pressure medicine. The medicine does not work as well if you skip doses. Skipping doses also puts you at risk for problems. Ask your doctor about side effects or reactions to medicines that you should watch  for. Contact a doctor if: You think you are having a reaction to the medicine you are taking. You have headaches that keep coming back. You feel dizzy. You have swelling in your ankles. You have trouble with your vision. Get help right away if: You get a very bad headache. You start to feel mixed up (confused). You feel weak or numb. You feel faint. You have very bad pain in your: Chest. Belly (abdomen). You vomit more than once. You have trouble breathing. These symptoms may be an emergency. Get help right away. Call 911. Do not wait to see if the symptoms will go away. Do not drive yourself to the hospital. Summary Hypertension is another name for high blood pressure. High blood pressure forces your heart to work harder to pump blood. For most people, a normal blood pressure is less than 120/80. Making healthy choices can help lower blood pressure. If your blood pressure does not get lower with healthy choices, you may need to take medicine. This information is not intended to replace advice given to you by your health care provider. Make sure you discuss any questions you have with your health care provider. Document Revised: 03/23/2021 Document Reviewed: 03/23/2021 Elsevier Patient Education  2024 [employer].

## 2023-09-04 NOTE — Progress Notes (Signed)
 I,Victoria T Deloria Lair, CMA,acting as a Neurosurgeon for Gwynneth Aliment, MD.,have documented all relevant documentation on the behalf of Gwynneth Aliment, MD,as directed by  Gwynneth Aliment, MD while in the presence of Gwynneth Aliment, MD.  Subjective:  Patient ID: Kayla Calhoun , female    DOB: 1941-02-24 , 83 y.o.   MRN: [id number]  Chief Complaint  Patient presents with   Dizziness   Blurred Vision    HPI  Patient presents today for dizziness & blurred vision. She reports this initially started last week. She has had two episodes before today's visit. The episodes happened both times when she was getting out of bed. There is no associated n/v.  She is not sure what could have triggered her sx. No recent colds/illness.   As of today, she has not been dizzy.  Denies chest pain & sob.      Past Medical History:  Diagnosis Date   Atrial fibrillation (HCC)    Glaucoma    Hypertension    Vitamin D deficiency      Family History  Problem Relation Age of Onset   Stroke Mother    Prostate cancer Father    Stroke Sister    Hypertension Sister    Hypertension Brother    Stroke Brother      Current Outpatient Medications:    ALPHAGAN P 0.1 % SOLN, Place 1 drop into both eyes in the morning and at bedtime., Disp: , Rfl:    amLODipine (NORVASC) 5 MG tablet, Take 1 tablet (5 mg total) by mouth daily., Disp: 90 tablet, Rfl: 2   Ascorbic Acid (VITAMIN C) 1000 MG tablet, Take 1,000 mg by mouth every other day. In the morning, Disp: , Rfl:    ASPIRIN LOW DOSE 81 MG EC tablet, TAKE 1 TABLET (81 MG TOTAL) BY MOUTH DAILY. SWALLOW WHOLE., Disp: 30 tablet, Rfl: 11   cetirizine (ZYRTEC) 10 MG tablet, Take 5 mg by mouth daily as needed for allergies., Disp: , Rfl:    Cholecalciferol (VITAMIN D3) 50 MCG (2000 UT) TABS, Take 2,000 Units by mouth daily., Disp: , Rfl:    ketorolac (ACULAR) 0.5 % ophthalmic solution, Place 1 drop into the left eye 4 (four) times daily., Disp: , Rfl:    levobunolol  (BETAGAN) 0.5 % ophthalmic solution, Place 1 drop into both eyes 2 (two) times daily., Disp: , Rfl:    LUMIGAN 0.01 % SOLN, SMARTSIG:1 Drop(s) In Eye(s) Every Evening, Disp: , Rfl:    metoprolol succinate (TOPROL-XL) 50 MG 24 hr tablet, TAKE 1 TABLET BY MOUTH EVERY DAY WITH OR IMMEDIATELY FOLLOWING A MEAL, Disp: 90 tablet, Rfl: 1   moxifloxacin (VIGAMOX) 0.5 % ophthalmic solution, Place 1 drop into the left eye 4 (four) times daily., Disp: , Rfl:    Noni, [place] citrifolia, (NONI JUICE PO), Take 60 mLs by mouth every other day., Disp: , Rfl:    prednisoLONE acetate (PRED FORTE) 1 % ophthalmic suspension, Place 1 drop into the left eye 4 (four) times daily., Disp: , Rfl:    rosuvastatin (CRESTOR) 10 MG tablet, Take 1 tablet (10 mg total) by mouth daily., Disp: 90 tablet, Rfl: 3   TRAVATAN Z 0.004 % SOLN ophthalmic solution, Place 1 drop into both eyes at bedtime., Disp: , Rfl:    vitamin E 180 MG (400 UNITS) capsule, Take 400 Units by mouth 2 (two) times a week., Disp: , Rfl:    triamterene-hydrochlorothiazide (MAXZIDE-25) 37.5-25 MG tablet, TAKE 1/2 TABLET  BY MOUTH DAILY, Disp: 90 tablet, Rfl: 1   Allergies  Allergen Reactions   Codeine Other (See Comments)   Sulfa Antibiotics Other (See Comments)   Contrast Media [Iodinated Contrast Media] Rash    Skin peeled (head to heel)     Review of Systems  Constitutional: Negative.   Respiratory: Negative.    Cardiovascular: Negative.   Gastrointestinal: Negative.   Neurological: Negative.   Psychiatric/Behavioral: Negative.       Today's Vitals   09/04/23 1540 09/04/23 1647 09/04/23 1648 09/04/23 1649  BP: 118/84 110/78 118/78 118/70  Pulse: 70 78 95 94  Temp: 98.5 F (36.9 C)     SpO2: 98%     Weight: 176 lb (79.8 kg)     Height: 5\' 2"  (1.575 m)      Body mass index is 32.19 kg/m.  Wt Readings from Last 3 Encounters:  09/04/23 176 lb (79.8 kg)  03/25/23 177 lb (80.3 kg)  12/19/22 177 lb (80.3 kg)     Objective:  Physical  Exam Vitals and nursing note reviewed.  Constitutional:      Appearance: Normal appearance. She is obese.  HENT:     Head: Normocephalic and atraumatic.     Right Ear: Tympanic membrane, ear canal and external ear normal. There is no impacted cerumen.     Left Ear: Tympanic membrane, ear canal and external ear normal. There is no impacted cerumen.  Eyes:     Extraocular Movements: Extraocular movements intact.  Cardiovascular:     Rate and Rhythm: Normal rate and regular rhythm.     Heart sounds: Normal heart sounds.  Pulmonary:     Effort: Pulmonary effort is normal.     Breath sounds: Normal breath sounds.  Musculoskeletal:     Cervical back: Normal range of motion.  Skin:    General: Skin is warm.  Neurological:     General: No focal deficit present.     Mental Status: She is alert.  Psychiatric:        Mood and Affect: Mood normal.        Behavior: Behavior normal.         Assessment And Plan:  Dizziness Assessment & Plan: She is not orthostatic today. She does not feel the room is spinning. I will check labs as below and address as indicated. If sx persist, will consider brain imaging.  Orders: -     CBC -     CMP14+EGFR -     Hemoglobin A1c -     TSH  Blurry vision Assessment & Plan: I will check labs as below to r/o diabetes. She agrees to ophthalmology referral.   Orders: -     CMP14+EGFR -     Hemoglobin A1c -     Ambulatory referral to Ophthalmology  Hypertensive heart and renal disease with renal failure, stage 1 through stage 4 or unspecified chronic kidney disease, without heart failure Assessment & Plan: Chronic, controlled. She wil c/w amlodipine 5mg  and metoprolol XL 50mg  daily. She is encouraged to follow low sodium diet and to follow a heart healthy lifestyle. She is also encouraged to stay well hydrated given underlying CKD.   Orders: -     CMP14+EGFR  Permanent atrial fibrillation [facility]) Assessment & Plan: Chronic, she is s/p Watchman  procedure. No longer needs anticoagulation. She is currently on Bblocker therapy.    Coronary atherosclerosis due to lipid rich plaque Assessment & Plan: Chronic, LDL goal < 70. CAC score >600.  She will c/w ASA 81mg , metoprolol XL 50mg   and rosuvasttain 10mg  daily, She is encouraged to follow a heart healthy lifestyle.    Class 1 obesity due to excess calories with serious comorbidity and body mass index (BMI) of 32.0 to 32.9 in adult Assessment & Plan: She is encouraged to strive for BMI less than 30 to decrease cardiac risk. Advised to aim for at least 150 minutes of exercise per week.    Presence of heart assist device [facility]) Assessment & Plan: She is s/p Watchman procedure.    Drug therapy -     Vitamin B12  She is encouraged to strive for BMI less than 30 to decrease cardiac risk. Advised to aim for at least 150 minutes of exercise per week.    Return if symptoms worsen or fail to improve.  Patient was given opportunity to ask questions. Patient verbalized understanding of the plan and was able to repeat key elements of the plan. All questions were answered to their satisfaction.    I, Gwynneth Aliment, MD, have reviewed all documentation for this visit. The documentation on 09/04/23 for the exam, diagnosis, procedures, and orders are all accurate and complete.   IF YOU HAVE BEEN REFERRED TO A SPECIALIST, IT MAY TAKE 1-2 WEEKS TO SCHEDULE/PROCESS THE REFERRAL. IF YOU HAVE NOT HEARD FROM US/SPECIALIST IN TWO WEEKS, PLEASE GIVE [place] A CALL AT [phone].   THE PATIENT IS ENCOURAGED TO PRACTICE SOCIAL DISTANCING DUE TO THE COVID-19 PANDEMIC.

## 2023-09-05 LAB — CMP14+EGFR
ALT: 13 IU/L (ref 0–32)
AST: 31 IU/L (ref 0–40)
Albumin: 4.6 g/dL (ref 3.7–4.7)
Alkaline Phosphatase: 67 IU/L (ref 44–121)
BUN/Creatinine Ratio: 21 (ref 12–28)
BUN: 18 mg/dL (ref 8–27)
Bilirubin Total: 0.6 mg/dL (ref 0.0–1.2)
CO2: 26 mmol/L (ref 20–29)
Calcium: 10.2 mg/dL (ref 8.7–10.3)
Chloride: 101 mmol/L (ref 96–106)
Creatinine, Ser: 0.84 mg/dL (ref 0.57–1.00)
Globulin, Total: 2.7 g/dL (ref 1.5–4.5)
Glucose: 88 mg/dL (ref 70–99)
Potassium: 4.4 mmol/L (ref 3.5–5.2)
Sodium: 139 mmol/L (ref 134–144)
Total Protein: 7.3 g/dL (ref 6.0–8.5)
eGFR: 68 mL/min/{1.73_m2} (ref >59)

## 2023-09-05 LAB — CBC
Hematocrit: 38.9 % (ref 34.0–46.6)
Hemoglobin: 12.5 g/dL (ref 11.1–15.9)
MCH: 28.3 pg (ref 26.6–33.0)
MCHC: 32.1 g/dL (ref 31.5–35.7)
MCV: 88 fL (ref 79–97)
Platelets: 234 10*3/uL (ref 150–450)
RBC: 4.42 x10E6/uL (ref 3.77–5.28)
RDW: 13.3 % (ref 11.7–15.4)
WBC: 4.4 10*3/uL (ref 3.4–10.8)

## 2023-09-05 LAB — TSH: TSH: 2.13 u[IU]/mL (ref 0.450–4.500)

## 2023-09-05 LAB — VITAMIN B12: Vitamin B-12: 811 pg/mL (ref 232–1245)

## 2023-09-05 LAB — HEMOGLOBIN A1C
Est. average glucose Bld gHb Est-mCnc: 126 mg/dL
Hgb A1c MFr Bld: 6 % — ABNORMAL HIGH (ref 4.8–5.6)

## 2023-09-09 DIAGNOSIS — H538 Other visual disturbances: Secondary | ICD-10-CM | POA: Insufficient documentation

## 2023-09-09 DIAGNOSIS — R42 Dizziness and giddiness: Secondary | ICD-10-CM | POA: Insufficient documentation

## 2023-09-09 NOTE — Assessment & Plan Note (Signed)
 She is not orthostatic today. She does not feel the room is spinning. I will check labs as below and address as indicated. If sx persist, will consider brain imaging.

## 2023-09-09 NOTE — Assessment & Plan Note (Addendum)
 Chronic, controlled. She wil c/w amlodipine 5mg  and metoprolol XL 50mg  daily. She is encouraged to follow low sodium diet and to follow a heart healthy lifestyle. She is also encouraged to stay well hydrated given underlying CKD.

## 2023-09-09 NOTE — Assessment & Plan Note (Signed)
 She is s/p Watchman procedure.

## 2023-09-09 NOTE — Assessment & Plan Note (Signed)
Chronic, she is s/p Watchman procedure. No longer needs anticoagulation. She is currently on Bblocker therapy.

## 2023-09-09 NOTE — Assessment & Plan Note (Signed)
 Chronic, LDL goal < 70. CAC score >600.   She will c/w ASA 81mg , metoprolol XL 50mg   and rosuvasttain 10mg  daily, She is encouraged to follow a heart healthy lifestyle.

## 2023-09-09 NOTE — Assessment & Plan Note (Signed)
 She is encouraged to strive for BMI less than 30 to decrease cardiac risk. Advised to aim for at least 150 minutes of exercise per week.

## 2023-09-09 NOTE — Assessment & Plan Note (Signed)
 I will check labs as below to r/o diabetes. She agrees to ophthalmology referral.

## 2023-09-11 ENCOUNTER — Telehealth: Payer: Self-pay | Admitting: Cardiology

## 2023-09-11 NOTE — Telephone Encounter (Signed)
 The patient's daughter called and stated that she was informed the patient needs an appointment with Dr. Anne Fu at the end of this month due to the echocardiogram results.

## 2023-09-11 NOTE — Telephone Encounter (Signed)
 Spoke with Steward Drone per DPR  and she was calling to get echo results. Did inform her that echo has not been finalized by provider. Once finalized we can give her a call. She asks that we call her because she patient has been having trouble remembering things and has been getting confused.

## 2023-09-13 NOTE — Telephone Encounter (Addendum)
 Spoke with Steward Drone per DPR and she gave Echo results. She  verbalized understanding no questions at this time.

## 2023-11-04 ENCOUNTER — Encounter: Payer: Self-pay | Admitting: Internal Medicine

## 2023-11-04 ENCOUNTER — Ambulatory Visit: Payer: Self-pay | Admitting: Internal Medicine

## 2023-11-04 VITALS — BP 110/70 | HR 93 | Temp 98.1°F | Ht 62.0 in | Wt 178.6 lb

## 2023-11-04 DIAGNOSIS — R7303 Prediabetes: Secondary | ICD-10-CM

## 2023-11-04 DIAGNOSIS — I1310 Hypertensive heart and chronic kidney disease without heart failure, with stage 1 through stage 4 chronic kidney disease, or unspecified chronic kidney disease: Secondary | ICD-10-CM

## 2023-11-04 DIAGNOSIS — E6609 Other obesity due to excess calories: Secondary | ICD-10-CM

## 2023-11-04 DIAGNOSIS — N182 Chronic kidney disease, stage 2 (mild): Secondary | ICD-10-CM

## 2023-11-04 DIAGNOSIS — R0789 Other chest pain: Secondary | ICD-10-CM

## 2023-11-04 DIAGNOSIS — Z6832 Body mass index (BMI) 32.0-32.9, adult: Secondary | ICD-10-CM

## 2023-11-04 DIAGNOSIS — I4821 Permanent atrial fibrillation: Secondary | ICD-10-CM | POA: Diagnosis not present

## 2023-11-04 DIAGNOSIS — R42 Dizziness and giddiness: Secondary | ICD-10-CM

## 2023-11-04 DIAGNOSIS — E66811 Obesity, class 1: Secondary | ICD-10-CM

## 2023-11-04 DIAGNOSIS — H9193 Unspecified hearing loss, bilateral: Secondary | ICD-10-CM

## 2023-11-04 NOTE — Progress Notes (Signed)
 I,Victoria T Basil Lim, CMA,acting as a Neurosurgeon for Smiley Dung, MD.,have documented all relevant documentation on the behalf of Smiley Dung, MD,as directed by  Smiley Dung, MD while in the presence of Smiley Dung, MD.  Subjective:  Patient ID: Kayla Calhoun , female    DOB: 22-Jul-1940 , 83 y.o.   MRN: [id number]  Chief Complaint  Patient presents with   Hypertension    Patient presents today for bp & prediabetes follow up. She reports compliance with medications. Denies chest pain & sob.  She still complains of constant dizziness. She had a dizzy spells on Thursday, Friday & Saturday. She states having to get up from bed slowly. Then after she moves around a little & takes her bp medication she feels better. She reports taking Metoprolol  50MG  at night. Amlodipine  5MG  & Maxzide  37.5-25MG  she takes in the morning.    Prediabetes    HPI Discussed the use of AI scribe software for clinical note transcription with the patient, who gave verbal consent to proceed.  History of Present Illness Kayla Calhoun is an 83 year old female with hypertension and atrial fibrillation who presents with dizziness and concerns about her A1c levels.  She has been experiencing dizziness for the past three days, specifically on Thursday, Friday, and Saturday. The dizziness is described as a sensation of not being able to see straight, accompanied by blurred vision. It improved by Sunday, allowing her to attend church. She feels 'a little funny' upon waking, needing to focus her eyes before moving, and has been cautious about getting up slowly in the mornings to avoid exacerbating the dizziness. She recalls experiencing similar dizziness in March.  She has a history of atrial fibrillation and has undergone a procedure for it, including the placement of a Watchman device. She is currently on metoprolol  to manage her heart rate. She acknowledges not drinking enough water recently. Her medication  regimen includes rosuvastatin  for cholesterol, which she last picked up in April, and she is also on blood thinners.  She denies experiencing ringing in the ears or headaches, except for a past aneurysm in 2009. She has not had any recent audiology evaluations since obtaining her hearing aids, which she sometimes feels she hears better without. She has a history of glaucoma and her grandmother also had glaucoma. She uses Lumigan eye drops for her glaucoma, which she has been taking for over a year.  She reports a fleeting, sharp, and stabbing pain in her chest that occurs intermittently, usually while sitting and reading, but it resolves quickly without intervention. No belching, indigestion, or heartburn.      Hypertension This is a chronic problem. The current episode started more than 1 year ago. The problem has been gradually improving since onset. The problem is controlled. Associated symptoms include chest pain. Pertinent negatives include no blurred vision, palpitations or shortness of breath. Risk factors for coronary artery disease include obesity, sedentary lifestyle and post-menopausal state. Past treatments include calcium  channel blockers, beta blockers and diuretics. The current treatment provides mild improvement. Hypertensive end-organ damage includes kidney disease.     Past Medical History:  Diagnosis Date   Atrial fibrillation (HCC)    Glaucoma    Hypertension    Vitamin D deficiency      Family History  Problem Relation Age of Onset   Stroke Mother    Prostate cancer Father    Stroke Sister    Hypertension Sister    Hypertension Brother  Stroke Brother      Current Outpatient Medications:    amLODipine  (NORVASC ) 5 MG tablet, Take 1 tablet (5 mg total) by mouth daily., Disp: 90 tablet, Rfl: 2   Ascorbic Acid (VITAMIN C) 1000 MG tablet, Take 1,000 mg by mouth every other day. In the morning, Disp: , Rfl:    ASPIRIN  LOW DOSE 81 MG EC tablet, TAKE 1 TABLET (81 MG  TOTAL) BY MOUTH DAILY. SWALLOW WHOLE., Disp: 30 tablet, Rfl: 11   cetirizine (ZYRTEC) 10 MG tablet, Take 5 mg by mouth daily as needed for allergies., Disp: , Rfl:    Cholecalciferol (VITAMIN D3) 50 MCG (2000 UT) TABS, Take 2,000 Units by mouth daily., Disp: , Rfl:    LUMIGAN 0.01 % SOLN, SMARTSIG:1 Drop(s) In Eye(s) Every Evening, Disp: , Rfl:    metoprolol  succinate (TOPROL -XL) 50 MG 24 hr tablet, TAKE 1 TABLET BY MOUTH EVERY DAY WITH OR IMMEDIATELY FOLLOWING A MEAL, Disp: 90 tablet, Rfl: 1   Noni, Morinda citrifolia, (NONI JUICE PO), Take 60 mLs by mouth every other day., Disp: , Rfl:    rosuvastatin  (CRESTOR ) 10 MG tablet, Take 1 tablet (10 mg total) by mouth daily., Disp: 90 tablet, Rfl: 3   triamterene -hydrochlorothiazide  (MAXZIDE -25) 37.5-25 MG tablet, TAKE 1/2 TABLET BY MOUTH DAILY, Disp: 90 tablet, Rfl: 1   vitamin E 180 MG (400 UNITS) capsule, Take 400 Units by mouth 2 (two) times a week., Disp: , Rfl:    levobunolol (BETAGAN) 0.5 % ophthalmic solution, Place 1 drop into both eyes 2 (two) times daily. (Patient not taking: Reported on 11/04/2023), Disp: , Rfl:    Allergies  Allergen Reactions   Codeine Other (See Comments)   Sulfa Antibiotics Other (See Comments)   Contrast Media [Iodinated Contrast Media] Rash    Skin peeled (head to heel)     Review of Systems  Constitutional: Negative.   HENT:  Positive for hearing loss.   Eyes:  Negative for blurred vision.  Respiratory: Negative.  Negative for shortness of breath.   Cardiovascular:  Positive for chest pain. Negative for palpitations.  Neurological:  Positive for dizziness.  Psychiatric/Behavioral: Negative.       Today's Vitals   11/04/23 1557 11/04/23 1615 11/04/23 1617 11/04/23 1619  BP: 118/74 116/74 120/84 110/70  Pulse: 98 95 96 93  Temp: 98.1 F (36.7 C)     SpO2: 98%     Weight: 178 lb 9.6 oz (81 kg)     Height: 5\' 2"  (1.575 m)      Body mass index is 32.67 kg/m.  Wt Readings from Last 3 Encounters:   11/04/23 178 lb 9.6 oz (81 kg)  09/04/23 176 lb (79.8 kg)  03/25/23 177 lb (80.3 kg)     Objective:  Physical Exam Vitals and nursing note reviewed.  Constitutional:      Appearance: Normal appearance. She is obese.  HENT:     Head: Normocephalic and atraumatic.  Eyes:     Extraocular Movements: Extraocular movements intact.  Cardiovascular:     Rate and Rhythm: Normal rate and regular rhythm.     Heart sounds: Normal heart sounds.  Pulmonary:     Effort: Pulmonary effort is normal.     Breath sounds: Normal breath sounds.  Musculoskeletal:     Cervical back: Normal range of motion.  Skin:    General: Skin is warm.  Neurological:     General: No focal deficit present.     Mental Status: She is alert.  Psychiatric:        Mood and Affect: Mood normal.        Behavior: Behavior normal.         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 She wil c/w amlodipine  5mg  and metoprolol  XL 50mg  daily. She is encouraged to follow low sodium diet and to follow a heart healthy lifestyle. She is also encouraged to stay well hydrated given underlying CKD.    Permanent atrial fibrillation [employer]) Assessment & Plan: Atrial fibrillation with Watchman device. Heart rate in high 90s, possibly due to dehydration. She admits to inadequate fluid intake.  On metoprolol  for rate control. - Ensure adequate hydration.   Chronic renal disease, stage II Assessment & Plan: Chronic, she is encouraged to avoid NSAIDS, stay hydrated and keep BP well controlled to decrease risk of CKD progression.    Prediabetes Assessment & Plan: Previous labs reviewed, her A1c has been elevated in the past. I will check an A1c today. Reminded to avoid refined sugars including sugary drinks/foods and processed meats including bacon, sausages and deli meats.      Dizziness Assessment & Plan: Intermittent dizziness with blurred vision. Differential includes dehydration, inner ear issues, or medication side effects. Recent changes in hearing aid effectiveness. - Order CT scan of the brain. - Refer to audiologist for hearing and inner ear evaluation. - Ensure adequate hydration.f head CT due to recurrent sx.   Orders: -     CT HEAD WO CONTRAST ( ); Future  Hearing deficit, bilateral Assessment & Plan: Discussion with daughter regarding audiologist referral due to changes in hearing aid effectiveness and dizziness. - Request records from current audiologist. - Discuss potential referral to a different audiologist.  Orders: -     Ambulatory referral to Audiology  Atypical chest pain Assessment & Plan: Her sx are intermittent and fleeting. She will let me know if her sx persist/worsen. Currently denies associated palpitations, diaphoresis and sob.    Class 1 obesity due to excess calories with serious comorbidity and body mass index (BMI) of 32.0 to 32.9 in adult Assessment & Plan: She is encouraged to strive for BMI less than 30 to decrease cardiac risk. Advised to aim for at least 150 minutes of exercise per week.     Return if symptoms worsen or fail to improve.  Patient was given opportunity to ask questions. Patient verbalized understanding of the plan and was able to repeat key elements of the plan. All questions were answered to their satisfaction.    I, Smiley Dung, MD, have reviewed all documentation for this visit. The documentation on 11/04/23 for the exam, diagnosis, procedures, and orders are all accurate and complete.  IF YOU HAVE BEEN REFERRED TO A SPECIALIST, IT MAY TAKE 1-2 WEEKS TO SCHEDULE/PROCESS THE REFERRAL. IF YOU HAVE NOT HEARD FROM US /SPECIALIST IN TWO WEEKS, PLEASE GIVE US  A CALL AT [phone].   THE PATIENT IS ENCOURAGED TO PRACTICE SOCIAL DISTANCING DUE TO THE COVID-19 PANDEMIC.

## 2023-11-04 NOTE — Patient Instructions (Signed)
 Hypertension, Adult Hypertension is another name for high blood pressure. High blood pressure forces your heart to work harder to pump blood. This can cause problems over time. There are two numbers in a blood pressure reading. There is a top number (systolic) over a bottom number (diastolic). It is best to have a blood pressure that is below 120/80. What are the causes? The cause of this condition is not known. Some other conditions can lead to high blood pressure. What increases the risk? Some lifestyle factors can make you more likely to develop high blood pressure: Smoking. Not getting enough exercise or physical activity. Being overweight. Having too much fat, sugar, calories, or salt (sodium) in your diet. Drinking too much alcohol. Other risk factors include: Having any of these conditions: Heart disease. Diabetes. High cholesterol. Kidney disease. Obstructive sleep apnea. Having a family history of high blood pressure and high cholesterol. Age. The risk increases with age. Stress. What are the signs or symptoms? High blood pressure may not cause symptoms. Very high blood pressure (hypertensive crisis) may cause: Headache. Fast or uneven heartbeats (palpitations). Shortness of breath. Nosebleed. Vomiting or feeling like you may vomit (nauseous). Changes in how you see. Very bad chest pain. Feeling dizzy. Seizures. How is this treated? This condition is treated by making healthy lifestyle changes, such as: Eating healthy foods. Exercising more. Drinking less alcohol. Your doctor may prescribe medicine if lifestyle changes do not help enough and if: Your top number is above 130. Your bottom number is above 80. Your personal target blood pressure may vary. Follow these instructions at home: Eating and drinking  If told, follow the DASH eating plan. To follow this plan: Fill one half of your plate at each meal with fruits and vegetables. Fill one fourth of your plate  at each meal with whole grains. Whole grains include whole-wheat pasta, brown rice, and whole-grain bread. Eat or drink low-fat dairy products, such as skim milk or low-fat yogurt. Fill one fourth of your plate at each meal with low-fat (lean) proteins. Low-fat proteins include fish, chicken without skin, eggs, beans, and tofu. Avoid fatty meat, cured and processed meat, or chicken with skin. Avoid pre-made or processed food. Limit the amount of salt in your diet to less than 1,500 mg each day. Do not drink alcohol if: Your doctor tells you not to drink. You are pregnant, may be pregnant, or are planning to become pregnant. If you drink alcohol: Limit how much you have to: 0-1 drink a day for women. 0-2 drinks a day for men. Know how much alcohol is in your drink. In the [place] one drink equals one 12 oz bottle of beer (355 mL), one 5 oz glass of wine (148 mL), or one 1 oz glass of hard liquor (44 mL). Lifestyle  Work with your doctor to stay at a healthy weight or to lose weight. Ask your doctor what the best weight is for you. Get at least 30 minutes of exercise that causes your heart to beat faster (aerobic exercise) most days of the week. This may include walking, swimming, or biking. Get at least 30 minutes of exercise that strengthens your muscles (resistance exercise) at least 3 days a week. This may include lifting weights or doing Pilates. Do not smoke or use any products that contain nicotine or tobacco. If you need help quitting, ask your doctor. Check your blood pressure at home as told by your doctor. Keep all follow-up visits. Medicines Take over-the-counter and prescription medicines  only as told by your doctor. Follow directions carefully. Do not skip doses of blood pressure medicine. The medicine does not work as well if you skip doses. Skipping doses also puts you at risk for problems. Ask your doctor about side effects or reactions to medicines that you should watch  for. Contact a doctor if: You think you are having a reaction to the medicine you are taking. You have headaches that keep coming back. You feel dizzy. You have swelling in your ankles. You have trouble with your vision. Get help right away if: You get a very bad headache. You start to feel mixed up (confused). You feel weak or numb. You feel faint. You have very bad pain in your: Chest. Belly (abdomen). You vomit more than once. You have trouble breathing. These symptoms may be an emergency. Get help right away. Call 911. Do not wait to see if the symptoms will go away. Do not drive yourself to the hospital. Summary Hypertension is another name for high blood pressure. High blood pressure forces your heart to work harder to pump blood. For most people, a normal blood pressure is less than 120/80. Making healthy choices can help lower blood pressure. If your blood pressure does not get lower with healthy choices, you may need to take medicine. This information is not intended to replace advice given to you by your health care provider. Make sure you discuss any questions you have with your health care provider. Document Revised: 03/23/2021 Document Reviewed: 03/23/2021 Elsevier Patient Education  2024 [employer].

## 2023-11-05 ENCOUNTER — Encounter: Payer: Self-pay | Admitting: Internal Medicine

## 2023-11-07 DIAGNOSIS — R0789 Other chest pain: Secondary | ICD-10-CM | POA: Insufficient documentation

## 2023-11-07 DIAGNOSIS — H9193 Unspecified hearing loss, bilateral: Secondary | ICD-10-CM | POA: Insufficient documentation

## 2023-11-07 NOTE — Assessment & Plan Note (Signed)
 She is encouraged to strive for BMI less than 30 to decrease cardiac risk. Advised to aim for at least 150 minutes of exercise per week.

## 2023-11-07 NOTE — Assessment & Plan Note (Addendum)
 Intermittent dizziness with blurred vision. Differential includes dehydration, inner ear issues, or medication side effects. Recent changes in hearing aid effectiveness. - Order CT scan of the brain. - Refer to audiologist for hearing and inner ear evaluation. - Ensure adequate hydration.f head CT due to recurrent sx.

## 2023-11-07 NOTE — Assessment & Plan Note (Signed)
 Chronic, controlled. She wil c/w amlodipine 5mg  and metoprolol XL 50mg  daily. She is encouraged to follow low sodium diet and to follow a heart healthy lifestyle. She is also encouraged to stay well hydrated given underlying CKD.

## 2023-11-07 NOTE — Assessment & Plan Note (Signed)
 Previous labs reviewed, her A1c has been elevated in the past. I will check an A1c today. Reminded to avoid refined sugars including sugary drinks/foods and processed meats including bacon, sausages and deli meats.

## 2023-11-07 NOTE — Assessment & Plan Note (Signed)
 Her sx are intermittent and fleeting. She will let me know if her sx persist/worsen. Currently denies associated palpitations, diaphoresis and sob.

## 2023-11-07 NOTE — Assessment & Plan Note (Signed)
Chronic, she is encouraged to avoid NSAIDS, stay hydrated and keep BP well controlled to decrease risk of CKD progression.

## 2023-11-07 NOTE — Assessment & Plan Note (Signed)
 Atrial fibrillation with Watchman device. Heart rate in high 90s, possibly due to dehydration. She admits to inadequate fluid intake.  On metoprolol  for rate control. - Ensure adequate hydration.

## 2023-11-07 NOTE — Assessment & Plan Note (Signed)
 Discussion with daughter regarding audiologist referral due to changes in hearing aid effectiveness and dizziness. - Request records from current audiologist. - Discuss potential referral to a different audiologist.

## 2023-11-14 ENCOUNTER — Inpatient Hospital Stay: Admission: RE | Admit: 2023-11-14 | Source: Ambulatory Visit

## 2023-11-14 ENCOUNTER — Telehealth: Payer: Self-pay | Admitting: Audiologist

## 2023-11-28 ENCOUNTER — Ambulatory Visit: Attending: Internal Medicine | Admitting: Audiologist

## 2023-11-28 DIAGNOSIS — H903 Sensorineural hearing loss, bilateral: Secondary | ICD-10-CM | POA: Diagnosis not present

## 2023-11-28 NOTE — Procedures (Signed)
  Outpatient Audiology and [facility] [address] [place], [place]  [address] [phone]  AUDIOLOGICAL  EVALUATION  NAME: Kayla Calhoun     DOB:   Dec 24, 1940      MRN: [id number]                                                                                     DATE: 11/28/2023     REFERENT: Cleave Curling, MD STATUS: Outpatient DIAGNOSIS: Sensorineural Hearing Loss    History: Ladaja was seen for an audiological evaluation due to difficulty hearing even with hearing aids on. She feels she hears better without them on. She has not had her hearing aids adjusted in more than two years. She has not had a recent hearing test.  Danila denies pain, pressure, or tinnitus. Lourene no history of hazardous noise exposure.  Demeka has Phonal ITE hearing aids from somewhere with 'True' in the name. Due to her having [employer], it is likely [employer] aids as they are the aid [employer] covers as part of her benefit.   Evaluation:  Otoscopy showed a clear view of the tympanic membranes, bilaterally Tympanometry results were consistent with normal middle ear function, bilaterally. Seleny flinched with every measure leading tympanogram to be distorted.  Audiometric testing was completed using Conventional Audiometry techniques with insert earphones and supraural headphones. 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 Speech Recognition Thresholds were obtained at 45 dB HL in the right ear and at 40 dB HL in the left ear. Word Recognition Testing was completed with recorded NU 6 lists, Anayia scored 100% in each ear at 80dB.   Results:  The test results were reviewed with Luisa and her daughter. 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  Felcia likely does hear better without aids on, they are too weak and not supplying nearly enough volume. She needs to have the aids turned up. It will be uncomfortable  for a few days, but if she can push through her ears will adjust she she will hear better. Go back to fitting audiologist and have aids fit to current hearing thresholds.  Audiogram printed and provided to Dezaree.    Recommendations: Hearing aid reprogramming needed for each ear. They are too weak. Return to fitting provider or see new practice from place on recommended list.   43 minutes spent testing and counseling on results.   If you have any questions please feel free to contact me at [phone].  Raynald Calkins Stalnaker Au.D.  Audiologist   11/28/2023  2:28 PM  Cc: Cleave Curling, MD

## 2023-12-02 ENCOUNTER — Encounter (INDEPENDENT_AMBULATORY_CARE_PROVIDER_SITE_OTHER): Payer: Self-pay | Admitting: Cardiology

## 2023-12-02 ENCOUNTER — Other Ambulatory Visit: Payer: Self-pay | Admitting: Internal Medicine

## 2023-12-02 ENCOUNTER — Encounter: Payer: Self-pay | Admitting: Internal Medicine

## 2023-12-02 DIAGNOSIS — R42 Dizziness and giddiness: Secondary | ICD-10-CM

## 2023-12-02 DIAGNOSIS — I4821 Permanent atrial fibrillation: Secondary | ICD-10-CM | POA: Diagnosis not present

## 2023-12-02 NOTE — Telephone Encounter (Signed)
 Please see the MyChart message reply(ies) for my assessment and plan.    I reviewed Dr. Elnita Hai note and since blood pressure was reassuring, I agree with her evaluation with MRI of brain or imaging of brain. Heart rate was normal as well. I am glad you saw Dr. Elnita Hai today.   This patient gave consent for this Medical Advice Message and is aware that it may result in a bill to [employer], as well as the possibility of receiving a bill for a co-payment or deductible. They are an established patient, but are not seeking medical advice exclusively about a problem treated during an in person or video visit in the last seven days. I did not recommend an in person or video visit within seven days of my reply.    I spent a total of 5 minutes cumulative time within 7 days through [employer].  Dorothye Gathers, MD

## 2023-12-03 ENCOUNTER — Ambulatory Visit: Admitting: Nurse Practitioner

## 2023-12-04 ENCOUNTER — Encounter: Payer: Self-pay | Admitting: Internal Medicine

## 2023-12-09 ENCOUNTER — Other Ambulatory Visit: Payer: Self-pay | Admitting: Internal Medicine

## 2023-12-09 MED ORDER — DIAZEPAM 2 MG PO TABS: ORAL_TABLET | ORAL | 0 refills | Status: DC

## 2023-12-11 ENCOUNTER — Ambulatory Visit
Admission: RE | Admit: 2023-12-11 | Discharge: 2023-12-11 | Disposition: A | Discharge status: Home / Self Care | Source: Ambulatory Visit | Attending: Internal Medicine | Admitting: Internal Medicine

## 2023-12-11 DIAGNOSIS — G319 Degenerative disease of nervous system, unspecified: Secondary | ICD-10-CM | POA: Diagnosis not present

## 2023-12-11 DIAGNOSIS — R42 Dizziness and giddiness: Secondary | ICD-10-CM

## 2023-12-11 DIAGNOSIS — R4182 Altered mental status, unspecified: Secondary | ICD-10-CM | POA: Diagnosis not present

## 2023-12-11 DIAGNOSIS — I6782 Cerebral ischemia: Secondary | ICD-10-CM | POA: Diagnosis not present

## 2023-12-15 ENCOUNTER — Ambulatory Visit: Payer: Self-pay | Admitting: Internal Medicine

## 2023-12-24 LAB — OPHTHALMOLOGY REPORT-SCANNED

## 2023-12-25 DIAGNOSIS — H401123 Primary open-angle glaucoma, left eye, severe stage: Secondary | ICD-10-CM | POA: Diagnosis not present

## 2023-12-25 DIAGNOSIS — H0100B Unspecified blepharitis left eye, upper and lower eyelids: Secondary | ICD-10-CM | POA: Diagnosis not present

## 2023-12-25 DIAGNOSIS — H04123 Dry eye syndrome of bilateral lacrimal glands: Secondary | ICD-10-CM | POA: Diagnosis not present

## 2023-12-25 DIAGNOSIS — H0100A Unspecified blepharitis right eye, upper and lower eyelids: Secondary | ICD-10-CM | POA: Diagnosis not present

## 2023-12-27 ENCOUNTER — Other Ambulatory Visit: Payer: Self-pay | Admitting: Internal Medicine

## 2023-12-27 DIAGNOSIS — I1310 Hypertensive heart and chronic kidney disease without heart failure, with stage 1 through stage 4 chronic kidney disease, or unspecified chronic kidney disease: Secondary | ICD-10-CM

## 2024-01-01 ENCOUNTER — Encounter: Payer: Self-pay | Admitting: Internal Medicine

## 2024-01-01 ENCOUNTER — Ambulatory Visit: Payer: Self-pay | Admitting: Internal Medicine

## 2024-01-01 ENCOUNTER — Ambulatory Visit

## 2024-01-01 VITALS — BP 110/60 | HR 80 | Temp 98.9°F | Ht 62.0 in | Wt 179.0 lb

## 2024-01-01 DIAGNOSIS — R7303 Prediabetes: Secondary | ICD-10-CM

## 2024-01-01 DIAGNOSIS — E66811 Obesity, class 1: Secondary | ICD-10-CM

## 2024-01-01 DIAGNOSIS — I4821 Permanent atrial fibrillation: Secondary | ICD-10-CM

## 2024-01-01 DIAGNOSIS — I1310 Hypertensive heart and chronic kidney disease without heart failure, with stage 1 through stage 4 chronic kidney disease, or unspecified chronic kidney disease: Secondary | ICD-10-CM

## 2024-01-01 DIAGNOSIS — R413 Other amnesia: Secondary | ICD-10-CM

## 2024-01-01 DIAGNOSIS — Z0000 Encounter for general adult medical examination without abnormal findings: Secondary | ICD-10-CM

## 2024-01-01 DIAGNOSIS — H9193 Unspecified hearing loss, bilateral: Secondary | ICD-10-CM

## 2024-01-01 DIAGNOSIS — Z6832 Body mass index (BMI) 32.0-32.9, adult: Secondary | ICD-10-CM

## 2024-01-01 DIAGNOSIS — N182 Chronic kidney disease, stage 2 (mild): Secondary | ICD-10-CM | POA: Diagnosis not present

## 2024-01-01 DIAGNOSIS — E6609 Other obesity due to excess calories: Secondary | ICD-10-CM

## 2024-01-01 DIAGNOSIS — Z8673 Personal history of transient ischemic attack (TIA), and cerebral infarction without residual deficits: Secondary | ICD-10-CM

## 2024-01-01 LAB — POCT URINALYSIS DIPSTICK
Bilirubin, UA: NEGATIVE
Clarity, UA: NEGATIVE
Color, UA: NEGATIVE
Glucose, UA: NEGATIVE
Ketones, UA: NEGATIVE
Leukocytes, UA: NEGATIVE
Nitrite, UA: NEGATIVE
Protein, UA: NEGATIVE
Spec Grav, UA: 1.01 (ref 1.010–1.025)
Urobilinogen, UA: 0.2 U/dL
pH, UA: 6.5 (ref 5.0–8.0)

## 2024-01-01 LAB — CMP14+EGFR
ALT: 11 IU/L (ref 0–32)
AST: 27 IU/L (ref 0–40)
Albumin: 4.4 g/dL (ref 3.7–4.7)
Alkaline Phosphatase: 69 IU/L (ref 44–121)
BUN/Creatinine Ratio: 14 (ref 12–28)
BUN: 12 mg/dL (ref 8–27)
Bilirubin Total: 0.5 mg/dL (ref 0.0–1.2)
CO2: 24 mmol/L (ref 20–29)
Calcium: 9.8 mg/dL (ref 8.7–10.3)
Chloride: 102 mmol/L (ref 96–106)
Creatinine, Ser: 0.87 mg/dL (ref 0.57–1.00)
Globulin, Total: 2.7 g/dL (ref 1.5–4.5)
Glucose: 93 mg/dL (ref 70–99)
Potassium: 4.7 mmol/L (ref 3.5–5.2)
Sodium: 141 mmol/L (ref 134–144)
Total Protein: 7.1 g/dL (ref 6.0–8.5)
eGFR: 65 mL/min/1.73 (ref >59)

## 2024-01-01 LAB — HEMOGLOBIN A1C
Est. average glucose Bld gHb Est-mCnc: 123 mg/dL
Hgb A1c MFr Bld: 5.9 % — ABNORMAL HIGH (ref 4.8–5.6)

## 2024-01-01 LAB — LIPID PANEL
Chol/HDL Ratio: 1.8 ratio (ref 0.0–4.4)
Cholesterol, Total: 120 mg/dL (ref 100–199)
HDL: 66 mg/dL (ref >39)
LDL Chol Calc (NIH): 43 mg/dL (ref 0–99)
Triglycerides: 42 mg/dL (ref 0–149)
VLDL Cholesterol Cal: 11 mg/dL (ref 5–40)

## 2024-01-01 NOTE — Assessment & Plan Note (Signed)
 Audiology eval completed. Hearing specialist recommends university program for cost-effective hearing aids. - Encourage follow-up with university program for hearing aids.

## 2024-01-01 NOTE — Assessment & Plan Note (Signed)
 Previous labs reviewed, her A1c has been elevated in the past. I will check an A1c today. Reminded to avoid refined sugars including sugary drinks/foods and processed meats including bacon, sausages and deli meats.

## 2024-01-01 NOTE — Assessment & Plan Note (Signed)
 Chronic, controlled. She wil c/w amlodipine 5mg  and metoprolol XL 50mg  daily. She is encouraged to follow low sodium diet and to follow a heart healthy lifestyle. She is also encouraged to stay well hydrated given underlying CKD.

## 2024-01-01 NOTE — Assessment & Plan Note (Signed)
 Scored 5 on 6CIT today. Daughter reports changes in her memory and behavioral changes. 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 She does not recognize a change in her memory. MRI shows cerebral atrophy and small vessel changes, indicating possible previous TIA. Neurological evaluation needed for vascular assessment. - Refer to neurology for evaluation.

## 2024-01-01 NOTE — Patient Instructions (Signed)
 Ms. Pua , Thank you for taking time out of your busy schedule to complete your Annual Wellness Visit with me. I enjoyed our conversation and look forward to speaking with you again next year. I, as well as your care team,  appreciate your ongoing commitment to your health goals. Please review the following plan we discussed and let me know if I can assist you in the future. Your Game plan/ To Do List    Referrals: If you haven't heard from the office you've been referred to, please reach out to them at the phone provided.  N/a Follow up Visits: Next Medicare AWV with our clinical staff: office will schedule   Have you seen your provider in the last 6 months (3 months if uncontrolled diabetes)? Yes Next Office Visit with your provider: 01/01/2024 at 11:40  Clinician Recommendations:  Aim for 30 minutes of exercise or brisk walking, 6-8 glasses of water, and 5 servings of fruits and vegetables each day.       This is a list of the screening recommended for you and due dates:  Health Maintenance  Topic Date Due   COVID-19 Vaccine (5 - 2024-25 season) 01/17/2024*   Flu Shot  01/17/2024   Medicare Annual Wellness Visit  12/31/2024   Pneumococcal Vaccine for age over 45  Completed   DEXA scan (bone density measurement)  Completed   Zoster (Shingles) Vaccine  Completed   Hepatitis B Vaccine  Aged Out   HPV Vaccine  Aged Out   Meningitis B Vaccine  Aged Out   DTaP/Tdap/Td vaccine  Discontinued  *Topic was postponed. The date shown is not the original due date.    Advanced directives: (Copy Requested) Please bring a copy of your health care power of attorney and living will to the office to be added to your chart at your convenience. You can mail to [facility] [address] [place], [place] [address] or email to [email or web address] Advance Care Planning is important because it:  [x]  Makes sure you receive the medical care that is consistent with your values,  goals, and preferences  [x]  It provides guidance to your family and loved ones and reduces their decisional burden about whether or not they are making the right decisions based on your wishes.  Follow the link provided in your after visit summary or read over the paperwork we have mailed to you to help you started getting your Advance Directives in place. If you need assistance in completing these, please reach out to us  so that we can help you!  See attachments for Preventive Care and Fall Prevention Tips. (=

## 2024-01-01 NOTE — Assessment & Plan Note (Signed)
 Atrial fibrillation with Watchman device. HR is better controlled today, 80.  Previously elevated due to dehydration. Encouraged to stay well hydrated.   On metoprolol  for rate control. - Ensure adequate hydration.

## 2024-01-01 NOTE — Patient Instructions (Addendum)
 Hypertension, Adult Hypertension is another name for high blood pressure. High blood pressure forces your heart to work harder to pump blood. This can cause problems over time. There are two numbers in a blood pressure reading. There is a top number (systolic) over a bottom number (diastolic). It is best to have a blood pressure that is below 120/80. What are the causes? The cause of this condition is not known. Some other conditions can lead to high blood pressure. What increases the risk? Some lifestyle factors can make you more likely to develop high blood pressure: Smoking. Not getting enough exercise or physical activity. Being overweight. Having too much fat, sugar, calories, or salt (sodium) in your diet. Drinking too much alcohol. Other risk factors include: Having any of these conditions: Heart disease. Diabetes. High cholesterol. Kidney disease. Obstructive sleep apnea. Having a family history of high blood pressure and high cholesterol. Age. The risk increases with age. Stress. What are the signs or symptoms? High blood pressure may not cause symptoms. Very high blood pressure (hypertensive crisis) may cause: Headache. Fast or uneven heartbeats (palpitations). Shortness of breath. Nosebleed. Vomiting or feeling like you may vomit (nauseous). Changes in how you see. Very bad chest pain. Feeling dizzy. Seizures. How is this treated? This condition is treated by making healthy lifestyle changes, such as: Eating healthy foods. Exercising more. Drinking less alcohol. Your doctor may prescribe medicine if lifestyle changes do not help enough and if: Your top number is above 130. Your bottom number is above 80. Your personal target blood pressure may vary. Follow these instructions at home: Eating and drinking  If told, follow the DASH eating plan. To follow this plan: Fill one half of your plate at each meal with fruits and vegetables. Fill one fourth of your plate  at each meal with whole grains. Whole grains include whole-wheat pasta, brown rice, and whole-grain bread. Eat or drink low-fat dairy products, such as skim milk or low-fat yogurt. Fill one fourth of your plate at each meal with low-fat (lean) proteins. Low-fat proteins include fish, chicken without skin, eggs, beans, and tofu. Avoid fatty meat, cured and processed meat, or chicken with skin. Avoid pre-made or processed food. Limit the amount of salt in your diet to less than 1,500 mg each day. Do not drink alcohol if: Your doctor tells you not to drink. You are pregnant, may be pregnant, or are planning to become pregnant. If you drink alcohol: Limit how much you have to: 0-1 drink a day for women. 0-2 drinks a day for men. Know how much alcohol is in your drink. In the [place] one drink equals one 12 oz bottle of beer (355 mL), one 5 oz glass of wine (148 mL), or one 1 oz glass of hard liquor (44 mL). Lifestyle  Work with your doctor to stay at a healthy weight or to lose weight. Ask your doctor what the best weight is for you. Get at least 30 minutes of exercise that causes your heart to beat faster (aerobic exercise) most days of the week. This may include walking, swimming, or biking. Get at least 30 minutes of exercise that strengthens your muscles (resistance exercise) at least 3 days a week. This may include lifting weights or doing Pilates. Do not smoke or use any products that contain nicotine or tobacco. If you need help quitting, ask your doctor. Check your blood pressure at home as told by your doctor. Keep all follow-up visits. Medicines Take over-the-counter and prescription medicines  only as told by your doctor. Follow directions carefully. Do not skip doses of blood pressure medicine. The medicine does not work as well if you skip doses. Skipping doses also puts you at risk for problems. Ask your doctor about side effects or reactions to medicines that you should watch  for. Contact a doctor if: You think you are having a reaction to the medicine you are taking. You have headaches that keep coming back. You feel dizzy. You have swelling in your ankles. You have trouble with your vision. Get help right away if: You get a very bad headache. You start to feel mixed up (confused). You feel weak or numb. You feel faint. You have very bad pain in your: Chest. Belly (abdomen). You vomit more than once. You have trouble breathing. These symptoms may be an emergency. Get help right away. Call 911. Do not wait to see if the symptoms will go away. Do not drive yourself to the hospital. Summary Hypertension is another name for high blood pressure. High blood pressure forces your heart to work harder to pump blood. For most people, a normal blood pressure is less than 120/80. Making healthy choices can help lower blood pressure. If your blood pressure does not get lower with healthy choices, you may need to take medicine. This information is not intended to replace advice given to you by your health care provider. Make sure you discuss any questions you have with your health care provider. Document Revised: 03/23/2021 Document Reviewed: 03/23/2021 Elsevier Patient Education  2024 [employer].

## 2024-01-01 NOTE — Progress Notes (Signed)
 I,Victoria T Emmitt, CMA,acting as a Neurosurgeon for [name], MD.,have documented all relevant documentation on the behalf of [name], MD,as directed by  [name], MD while in the presence of [name], MD.  Subjective:  Patient ID: Kayla Calhoun , female    DOB: October 13, 1940 , 83 y.o.   MRN: [id number]  Chief Complaint  Patient presents with   Hypertension    Patient presents today for bp & predm follow up. She reports compliance with medications. Denies headache, chest pain & sob. She would like to go over MRI results with her daughter present.  AWV completed with [facility] Advisor: Nikeah.    Prediabetes    HPI Discussed the use of AI scribe software for clinical note transcription with the patient, who gave verbal consent to proceed.  History of Present Illness Kayla Calhoun is an 83 year old female with hypertension who presents with memory concerns.  She is accompanied by her daughter today.   She notes difficulty with short-term recall, such as remembering specific instructions or phrases given during a memory test, but can recall routine tasks like baking cakes without issue. She underwent an MRI of the brain and recalls being told she had some changes related to aging and blood vessels.   Her current medications include amlodipine , metoprolol , Crestor , and Maxidex . She recently filled her prescription for amlodipine  and metoprolol , noting a previous gap in obtaining metoprolol .  She has no bladder issues and reports good energy levels and appetite. Her recent dietary intake included two pieces of cheese toast and coffee.  She has seen a hearing specialist and was pleased with the service, receiving recommendations for more affordable hearing aids through a local program.   Hypertension This is a chronic problem. The current episode started more than 1 year ago. The problem has been gradually improving since onset. The problem is controlled. Pertinent  negatives include no blurred vision, chest pain, palpitations or shortness of breath. Risk factors for coronary artery disease include obesity, sedentary lifestyle and post-menopausal state. Past treatments include calcium  channel blockers, beta blockers and diuretics. The current treatment provides mild improvement. Hypertensive end-organ damage includes kidney disease.     Past Medical History:  Diagnosis Date   Atrial fibrillation (HCC)    Glaucoma    Hypertension    Vitamin D deficiency      Family History  Problem Relation Age of Onset   Stroke Mother    Prostate cancer Father    Stroke Sister    Hypertension Sister    Hypertension Brother    Stroke Brother      Current Outpatient Medications:    amLODipine  (NORVASC ) 5 MG tablet, TAKE 1 TABLET (5 MG TOTAL) BY MOUTH DAILY., Disp: 90 tablet, Rfl: 2   Ascorbic Acid (VITAMIN C) 1000 MG tablet, Take 1,000 mg by mouth every other day. In the morning, Disp: , Rfl:    ASPIRIN  LOW DOSE 81 MG EC tablet, TAKE 1 TABLET (81 MG TOTAL) BY MOUTH DAILY. SWALLOW WHOLE., Disp: 30 tablet, Rfl: 11   cetirizine (ZYRTEC) 10 MG tablet, Take 5 mg by mouth daily as needed for allergies., Disp: , Rfl:    Cholecalciferol (VITAMIN D3) 50 MCG (2000 UT) TABS, Take 2,000 Units by mouth daily., Disp: , Rfl:    diazepam  (VALIUM ) 2 MG tablet, One tab po one hour prior to procedure, repeat as needed (Patient not taking: Reported on 01/01/2024), Disp: 2 tablet, Rfl: 0   levobunolol (  BETAGAN) 0.5 % ophthalmic solution, Place 1 drop into both eyes 2 (two) times daily., Disp: , Rfl:    LUMIGAN 0.01 % SOLN, SMARTSIG:1 Drop(s) In Eye(s) Every Evening, Disp: , Rfl:    metoprolol  succinate (TOPROL -XL) 50 MG 24 hr tablet, TAKE 1 TABLET BY MOUTH EVERY DAY WITH OR IMMEDIATELY FOLLOWING A MEAL, Disp: 90 tablet, Rfl: 1   Noni, Morinda citrifolia, (NONI JUICE PO), Take 60 mLs by mouth every other day. (Patient not taking: Reported on 01/01/2024), Disp: , Rfl:    rosuvastatin   (CRESTOR ) 10 MG tablet, Take 1 tablet (10 mg total) by mouth daily., Disp: 90 tablet, Rfl: 3   triamterene -hydrochlorothiazide  (MAXZIDE -25) 37.5-25 MG tablet, TAKE 1/2 TABLET BY MOUTH DAILY, Disp: 90 tablet, Rfl: 1   vitamin E 180 MG (400 UNITS) capsule, Take 400 Units by mouth 2 (two) times a week., Disp: , Rfl:    Allergies  Allergen Reactions   Codeine Other (See Comments)   Sulfa Antibiotics Other (See Comments)   Contrast Media [Iodinated Contrast Media] Rash    Skin peeled (head to heel)     Review of Systems  Constitutional: Negative.   Eyes:  Negative for blurred vision.  Respiratory: Negative.  Negative for shortness of breath.   Cardiovascular: Negative.  Negative for chest pain and palpitations.  Neurological: Negative.   Psychiatric/Behavioral: Negative.       Today's Vitals   01/01/24 1045  BP: 110/60  Pulse: 80  Temp: 98.9 F (37.2 C)  SpO2: 98%  Weight: 179 lb (81.2 kg)  Height: 5' 2 (1.575 m)   Body mass index is 32.74 kg/m.  Wt Readings from Last 3 Encounters:  01/01/24 179 lb (81.2 kg)  01/01/24 179 lb (81.2 kg)  11/04/23 178 lb 9.6 oz (81 kg)     Objective:  Physical Exam Vitals and nursing note reviewed.  Constitutional:      Appearance: Normal appearance.  HENT:     Head: Normocephalic and atraumatic.  Eyes:     Extraocular Movements: Extraocular movements intact.  Cardiovascular:     Rate and Rhythm: Normal rate. Rhythm irregular.     Heart sounds: Normal heart sounds.  Pulmonary:     Effort: Pulmonary effort is normal.     Breath sounds: Normal breath sounds.  Musculoskeletal:     Cervical back: Normal range of motion.  Skin:    General: Skin is warm.  Neurological:     General: No focal deficit present.     Mental Status: She is alert.  Psychiatric:        Mood and Affect: Mood normal.        Behavior: Behavior normal.         Assessment And Plan:  Hypertensive heart and renal disease with renal failure, stage 1 through  stage 4 or unspecified chronic kidney disease, without heart failure Assessment & Plan: Chronic, controlled. She wil c/w amlodipine  5mg  and metoprolol  XL 50mg  daily. She is encouraged to follow low sodium diet and to follow a heart healthy lifestyle. She is also encouraged to stay well hydrated given underlying CKD.   Orders: -     CMP14+EGFR -     Lipid panel -     POCT urinalysis dipstick  Permanent atrial fibrillation [facility]) Assessment & Plan: Atrial fibrillation with Watchman device. HR is better controlled today, 80.  Previously elevated due to dehydration. Encouraged to stay well hydrated.   On metoprolol  for rate control. - Ensure adequate hydration.   Chronic  renal disease, stage II Assessment & Plan: Chronic, she is encouraged to avoid NSAIDS, stay hydrated and keep BP well controlled to decrease risk of CKD progression.    Prediabetes Assessment & Plan: Previous labs reviewed, her A1c has been elevated in the past. I will check an A1c today. Reminded to avoid refined sugars including sugary drinks/foods and processed meats including bacon, sausages and deli meats.    Orders: -     CMP14+EGFR -     Hemoglobin A1c  Memory changes Assessment & Plan: Scored 5 on 6CIT today. Daughter reports changes in her memory and behavioral changes. 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 She does not recognize a change in her memory. MRI shows cerebral atrophy and small vessel changes, indicating possible previous TIA. Neurological evaluation needed for vascular assessment. - Refer to neurology for evaluation.   Orders: -     Ambulatory referral to Neurology  Hearing deficit, bilateral Assessment & Plan: Audiology eval completed. Hearing specialist recommends university program for cost-effective hearing aids. - Encourage follow-up with university program for hearing aids.   Class 1 obesity due to excess calories with serious comorbidity and body mass index  (BMI) of 32.0 to 32.9 in adult Assessment & Plan: She is encouraged to strive for BMI less than 30 to decrease cardiac risk. Advised to aim for at least 150 minutes of exercise per week.    History of TIA (transient ischemic attack) -     VAS US  CAROTID; Future  Return for 4 monyh predm f/u.Kayla Calhoun  Patient was given opportunity to ask questions. Patient verbalized understanding of the plan and was able to repeat key elements of the plan. All questions were answered to their satisfaction.   I, [name], MD, have reviewed all documentation for this visit. The documentation on 01/01/24 for the exam, diagnosis, procedures, and orders are all accurate and complete.   IF YOU HAVE BEEN REFERRED TO A SPECIALIST, IT MAY TAKE 1-2 WEEKS TO SCHEDULE/PROCESS THE REFERRAL. IF YOU HAVE NOT HEARD FROM US /SPECIALIST IN TWO WEEKS, PLEASE GIVE US  A CALL AT [phone].   THE PATIENT IS ENCOURAGED TO PRACTICE SOCIAL DISTANCING DUE TO THE COVID-19 PANDEMIC.

## 2024-01-01 NOTE — Progress Notes (Signed)
 Subjective:   Kayla Calhoun is a 83 y.o. who presents for a Medicare Wellness preventive visit.  As a reminder, Annual Wellness Visits don't include a physical exam, and some assessments may be limited, especially if this visit is performed virtually. We may recommend an in-person follow-up visit with your provider if needed.  Visit Complete: In person    Persons Participating in Visit: Patient.  AWV Questionnaire: No: Patient Medicare AWV questionnaire was not completed prior to this visit.  Cardiac Risk Factors include: advanced age (>26men, >67 women);hypertension;obesity (BMI >30kg/m2)     Objective:    Today's Vitals   01/01/24 1045  BP: 110/60  Pulse: 80  Temp: 98.9 F (37.2 C)  TempSrc: Oral  Weight: 179 lb (81.2 kg)  Height: 5' 2 (1.575 m)   Body mass index is 32.74 kg/m.     01/01/2024   10:52 AM 12/19/2022   11:16 AM 12/06/2021   11:30 AM 02/01/2021    9:55 AM 12/22/2020    8:03 AM 11/16/2020   12:14 PM 10/28/2019    9:48 AM  Advanced Directives  Does Patient Have a Medical Advance Directive? Yes Yes Yes Yes No Yes Yes  Type of Estate agent of [place];Living will Healthcare Power of [place];Living will Healthcare Power of [place];Living will Healthcare Power of [place];Living will  Healthcare Power of [place];Living will Healthcare Power of [place];Living will  Copy of Healthcare Power of Attorney in Chart? No - copy requested No - copy requested No - copy requested Yes - validated most recent copy scanned in chart (See row information)  No - copy requested No - copy requested  Would patient like information on creating a medical advance directive?     No - Patient declined      Current Medications (verified) Outpatient Encounter Medications as of 01/01/2024  Medication Sig   amLODipine  (NORVASC ) 5 MG tablet TAKE 1 TABLET (5 MG TOTAL) BY MOUTH DAILY.   Ascorbic Acid (VITAMIN C) 1000 MG tablet Take 1,000 mg by mouth every other  day. In the morning   ASPIRIN  LOW DOSE 81 MG EC tablet TAKE 1 TABLET (81 MG TOTAL) BY MOUTH DAILY. SWALLOW WHOLE.   cetirizine (ZYRTEC) 10 MG tablet Take 5 mg by mouth daily as needed for allergies.   Cholecalciferol (VITAMIN D3) 50 MCG (2000 UT) TABS Take 2,000 Units by mouth daily.   levobunolol (BETAGAN) 0.5 % ophthalmic solution Place 1 drop into both eyes 2 (two) times daily.   LUMIGAN 0.01 % SOLN SMARTSIG:1 Drop(s) In Eye(s) Every Evening   metoprolol  succinate (TOPROL -XL) 50 MG 24 hr tablet TAKE 1 TABLET BY MOUTH EVERY DAY WITH OR IMMEDIATELY FOLLOWING A MEAL   rosuvastatin  (CRESTOR ) 10 MG tablet Take 1 tablet (10 mg total) by mouth daily.   triamterene -hydrochlorothiazide  (MAXZIDE -25) 37.5-25 MG tablet TAKE 1/2 TABLET BY MOUTH DAILY   vitamin E 180 MG (400 UNITS) capsule Take 400 Units by mouth 2 (two) times a week.   diazepam  (VALIUM ) 2 MG tablet One tab po one hour prior to procedure, repeat as needed (Patient not taking: Reported on 01/01/2024)   Noni, Morinda citrifolia, (NONI JUICE PO) Take 60 mLs by mouth every other day. (Patient not taking: Reported on 01/01/2024)   No facility-administered encounter medications on file as of 01/01/2024.    Allergies (verified) Codeine, Sulfa antibiotics, and Contrast media [iodinated contrast media]   History: Past Medical History:  Diagnosis Date   Atrial fibrillation (HCC)    Glaucoma  Hypertension    Vitamin D deficiency    Past Surgical History:  Procedure Laterality Date   ABDOMINAL HYSTERECTOMY     CATARACT EXTRACTION Bilateral    Dr. Patrcia 9/27   KIDNEY SURGERY     LEFT ATRIAL APPENDAGE OCCLUSION N/A 12/22/2020   Procedure: LEFT ATRIAL APPENDAGE OCCLUSION;  Surgeon: Cindie Ole DASEN, MD;  Location: [facility] INVASIVE CV LAB;  Service: Cardiovascular;  Laterality: N/A;   TEE WITHOUT CARDIOVERSION N/A 12/22/2020   Procedure: TRANSESOPHAGEAL ECHOCARDIOGRAM (TEE);  Surgeon: Cindie Ole DASEN, MD;  Location: [facility] INVASIVE CV LAB;   Service: Cardiovascular;  Laterality: N/A;   TEE WITHOUT CARDIOVERSION N/A 02/01/2021   Procedure: TRANSESOPHAGEAL ECHOCARDIOGRAM (TEE);  Surgeon: Francyne Headland, MD;  Location: [facility] ENDOSCOPY;  Service: Cardiovascular;  Laterality: N/A;   TONSILLECTOMY     Family History  Problem Relation Age of Onset   Stroke Mother    Prostate cancer Father    Stroke Sister    Hypertension Sister    Hypertension Brother    Stroke Brother    Social History   Socioeconomic History   Marital status: Widowed    Spouse name: Not on file   Number of children: 1   Years of education: Not on file   Highest education level: Not on file  Occupational History   Occupation: retired  Tobacco Use   Smoking status: Former    Types: Cigarettes   Smokeless tobacco: Never   Tobacco comments:    been quit 35 years  Vaping Use   Vaping status: Never Used  Substance and Sexual Activity   Alcohol use: Not Currently   Drug use: Never   Sexual activity: Not Currently  Other Topics Concern   Not on file  Social History Narrative   Not on file   Social Drivers of Health   Financial Resource Strain: Low Risk  (01/01/2024)   Overall Financial Resource Strain (CARDIA)    Difficulty of Paying Living Expenses: Not hard at all  Food Insecurity: No Food Insecurity (01/01/2024)   Hunger Vital Sign    Worried About Running Out of Food in the Last Year: Never true    Ran Out of Food in the Last Year: Never true  Transportation Needs: No Transportation Needs (01/01/2024)   PRAPARE - Administrator, Civil Service (Medical): No    Lack of Transportation (Non-Medical): No  Physical Activity: Inactive (01/01/2024)   Exercise Vital Sign    Days of Exercise per Week: 0 days    Minutes of Exercise per Session: 0 min  Stress: No Stress Concern Present (01/01/2024)   [employer] of Occupational Health - Occupational Stress Questionnaire    Feeling of Stress: Not at all  Social Connections: Moderately  Isolated (01/01/2024)   Social Connection and Isolation Panel    Frequency of Communication with Friends and Family: More than three times a week    Frequency of Social Gatherings with Friends and Family: Twice a week    Attends Religious Services: More than 4 times per year    Active Member of [employer] or Organizations: No    Attends Banker Meetings: Never    Marital Status: Widowed    Tobacco Counseling Counseling given: Not Answered Tobacco comments: been quit 35 years    Clinical Intake:  Pre-visit preparation completed: Yes  Pain : No/denies pain     Nutritional Status: BMI > 30  Obese Nutritional Risks: None Diabetes: No  Lab Results  Component Value Date  HGBA1C 6.0 (H) 09/04/2023   HGBA1C 6.0 (H) 12/19/2022   HGBA1C 5.9 (H) 05/31/2022     How often do you need to have someone help you when you read instructions, pamphlets, or other written materials from your doctor or pharmacy?: 1 - Never  Interpreter Needed?: No  Information entered by :: NAllen LPN   Activities of Daily Living     01/01/2024   10:46 AM  In your present state of health, do you have any difficulty performing the following activities:  Hearing? 1  Comment wears hearing aids  Vision? 0  Difficulty concentrating or making decisions? 0  Walking or climbing stairs? 1  Dressing or bathing? 0  Doing errands, shopping? 1  Preparing Food and eating ? N  Using the Toilet? N  In the past six months, have you accidently leaked urine? N  Do you have problems with loss of bowel control? N  Managing your Medications? N  Managing your Finances? N  Housekeeping or managing your Housekeeping? N    Patient Care Team: Jarold Medici, MD as PCP - General (Internal Medicine) Cindie Ole DASEN, MD as PCP - Electrophysiology (Cardiology) Jeffrie Oneil BROCKS, MD as PCP - Cardiology (Cardiology)  I have updated your Care Teams any recent Medical Services you may have received from other  providers in the past year.     Assessment:   This is a routine wellness examination for Kayla Calhoun.  Hearing/Vision screen Hearing Screening - Comments:: Has hearing aids that are mainatained Vision Screening - Comments:: Regular eye exams, [place] [facility]   Goals Addressed             This Visit's Progress    Patient Stated       01/01/2024, continue to eat healthy       Depression Screen     01/01/2024   10:54 AM 11/04/2023    4:12 PM 12/19/2022   11:18 AM 12/06/2021   11:31 AM 11/30/2021   11:10 AM 11/16/2020   12:15 PM 11/02/2020    8:54 AM  PHQ 2/9 Scores  PHQ - 2 Score 0 0 0 0 0 0 0  PHQ- 9 Score 1 0 0        Fall Risk     01/01/2024   10:52 AM 11/04/2023    4:12 PM 09/04/2023    3:41 PM 12/19/2022   11:17 AM 12/06/2021   11:30 AM  Fall Risk   Falls in the past year? 1 0 0 0 0  Comment tripped      Number falls in past yr: 0 0 0 0 0  Injury with Fall? 0 0 0 0 0  Risk for fall due to : Medication side effect No Fall Risks No Fall Risks Medication side effect Medication side effect  Follow up Falls evaluation completed;Falls prevention discussed Falls evaluation completed Falls evaluation completed Falls prevention discussed;Falls evaluation completed Falls evaluation completed;Education provided;Falls prevention discussed      Data saved with a previous flowsheet row definition    MEDICARE RISK AT HOME:  Medicare Risk at Home Any stairs in or around the home?: Yes If so, are there any without handrails?: No Home free of loose throw rugs in walkways, pet beds, electrical cords, etc?: Yes Adequate lighting in your home to reduce risk of falls?: Yes Life alert?: No Use of a cane, walker or w/c?: No Grab bars in the bathroom?: No Shower chair or bench in shower?: No Elevated toilet seat or a handicapped  toilet?: Yes  TIMED UP AND GO:  Was the test performed?  Yes  Length of time to ambulate 10 feet: 5 sec Gait steady and fast without use of assistive  device  Cognitive Function: 6CIT completed        01/01/2024   10:55 AM 11/04/2023    4:39 PM 12/19/2022   11:19 AM 05/31/2022    3:31 PM 12/06/2021   11:32 AM  6CIT Screen  What Year? 0 points 0 points 0 points  0 points  What month? 0 points 0 points 0 points 0 points 0 points  What time? 3 points 0 points 0 points 0 points 3 points  Count back from 20 0 points 0 points 0 points 0 points 0 points  Months in reverse 0 points 0 points 0 points 0 points 0 points  Repeat phrase 2 points 0 points 2 points  0 points  Total Score 5 points 0 points 2 points  3 points    Immunizations Immunization History  Administered Date(s) Administered   PFIZER(Purple Top)SARS-COV-2 Vaccination 08/20/2019, 09/15/2019, 04/16/2020   Pfizer Covid-19 Vaccine Bivalent Booster 19yrs & up 05/22/2021   Pneumococcal Conjugate-13 11/19/2019   Pneumococcal Polysaccharide-23 03/27/2018   Tdap 06/30/2012   Zoster Recombinant(Shingrix ) 12/11/2021, 03/02/2022    Screening Tests Health Maintenance  Topic Date Due   COVID-19 Vaccine (5 - 2024-25 season) 01/17/2024 (Originally 02/17/2023)   INFLUENZA VACCINE  01/17/2024   Medicare Annual Wellness (AWV)  12/31/2024   Pneumococcal Vaccine: 50+ Years  Completed   DEXA SCAN  Completed   Zoster Vaccines- Shingrix   Completed   Hepatitis B Vaccines  Aged Out   HPV VACCINES  Aged Out   Meningococcal B Vaccine  Aged Out   DTaP/Tdap/Td  Discontinued    Health Maintenance  There are no preventive care reminders to display for this patient.  Health Maintenance Items Addressed: Declines covid vaccine  Additional Screening:  Vision Screening: Recommended annual ophthalmology exams for early detection of glaucoma and other disorders of the eye. Would you like a referral to an eye doctor? No    Dental Screening: Recommended annual dental exams for proper oral hygiene  Community Resource Referral / Chronic Care Management: CRR required this visit?  No   CCM  required this visit?  No   Plan:    I have personally reviewed and noted the following in the patient's chart:   Medical and social history Use of alcohol, tobacco or illicit drugs  Current medications and supplements including opioid prescriptions. Patient is not currently taking opioid prescriptions. Functional ability and status Nutritional status Physical activity Advanced directives List of other physicians Hospitalizations, surgeries, and ER visits in previous 12 months Vitals Screenings to include cognitive, depression, and falls Referrals and appointments  In addition, I have reviewed and discussed with patient certain preventive protocols, quality metrics, and best practice recommendations. A written personalized care plan for preventive services as well as general preventive health recommendations were provided to patient.   Kayla FORBES Dawn, LPN   [id number]/83/[id number]   After Visit Summary: (In Person-Printed) AVS printed and given to the patient  Notes: Nothing significant to report at this time.

## 2024-01-01 NOTE — Assessment & Plan Note (Signed)
Chronic, she is encouraged to avoid NSAIDS, stay hydrated and keep BP well controlled to decrease risk of CKD progression.

## 2024-01-01 NOTE — Assessment & Plan Note (Signed)
 She is encouraged to strive for BMI less than 30 to decrease cardiac risk. Advised to aim for at least 150 minutes of exercise per week.

## 2024-01-03 ENCOUNTER — Ambulatory Visit (HOSPITAL_COMMUNITY)
Admission: RE | Admit: 2024-01-03 | Discharge: 2024-01-03 | Disposition: A | Discharge status: Home / Self Care | Source: Ambulatory Visit | Attending: Internal Medicine | Admitting: Internal Medicine

## 2024-01-03 DIAGNOSIS — Z8673 Personal history of transient ischemic attack (TIA), and cerebral infarction without residual deficits: Secondary | ICD-10-CM | POA: Diagnosis not present

## 2024-01-05 ENCOUNTER — Ambulatory Visit: Payer: Self-pay | Admitting: Internal Medicine

## 2024-03-03 ENCOUNTER — Telehealth: Payer: Self-pay | Admitting: Cardiology

## 2024-03-03 MED ORDER — METOPROLOL SUCCINATE ER 50 MG PO TB24: 50.0000 mg | ORAL_TABLET | Freq: Every day | ORAL | 0 refills | Status: DC

## 2024-03-03 NOTE — Telephone Encounter (Signed)
 Pt's medication was sent to pt's pharmacy as requested. Confirmation received.

## 2024-03-03 NOTE — Telephone Encounter (Signed)
*  STAT* If patient is at the pharmacy, call can be transferred to refill team.   1. Which medications need to be refilled? (please list name of each medication and dose if known) metoprolol  succinate (TOPROL -XL) 50 MG 24 hr tablet    2. Would you like to learn more about the convenience, safety, & potential cost savings by using the [facility]?      3. Are you open to using the [facility] (Type [facility].  ).   4. Which pharmacy/location (including street and city if local pharmacy) is medication to be sent to? [id number] - [place], [place] - [address]    5. Do they need a 30 day or 90 day supply? 90 day

## 2024-03-25 ENCOUNTER — Other Ambulatory Visit: Payer: Self-pay | Admitting: Internal Medicine

## 2024-03-25 ENCOUNTER — Other Ambulatory Visit: Payer: Self-pay | Admitting: Cardiology

## 2024-04-28 ENCOUNTER — Telehealth: Payer: Self-pay | Admitting: Internal Medicine

## 2024-04-28 ENCOUNTER — Encounter: Payer: Self-pay | Admitting: Internal Medicine

## 2024-04-28 NOTE — Telephone Encounter (Signed)
 Called pt to reschedule appt due to provider being unavailable at the time no answer left VM

## 2024-05-04 ENCOUNTER — Ambulatory Visit (INDEPENDENT_AMBULATORY_CARE_PROVIDER_SITE_OTHER): Admitting: Internal Medicine

## 2024-05-04 ENCOUNTER — Encounter: Payer: Self-pay | Admitting: Internal Medicine

## 2024-05-04 ENCOUNTER — Ambulatory Visit: Payer: Self-pay | Admitting: Internal Medicine

## 2024-05-04 VITALS — BP 120/70 | HR 94 | Temp 97.8°F | Ht 62.0 in | Wt 175.0 lb

## 2024-05-04 DIAGNOSIS — N182 Chronic kidney disease, stage 2 (mild): Secondary | ICD-10-CM

## 2024-05-04 DIAGNOSIS — E66811 Obesity, class 1: Secondary | ICD-10-CM

## 2024-05-04 DIAGNOSIS — R7303 Prediabetes: Secondary | ICD-10-CM | POA: Diagnosis not present

## 2024-05-04 DIAGNOSIS — Z2821 Immunization not carried out because of patient refusal: Secondary | ICD-10-CM

## 2024-05-04 DIAGNOSIS — Z6832 Body mass index (BMI) 32.0-32.9, adult: Secondary | ICD-10-CM

## 2024-05-04 DIAGNOSIS — I1310 Hypertensive heart and chronic kidney disease without heart failure, with stage 1 through stage 4 chronic kidney disease, or unspecified chronic kidney disease: Secondary | ICD-10-CM | POA: Diagnosis not present

## 2024-05-04 DIAGNOSIS — I4821 Permanent atrial fibrillation: Secondary | ICD-10-CM | POA: Diagnosis not present

## 2024-05-04 DIAGNOSIS — E6609 Other obesity due to excess calories: Secondary | ICD-10-CM

## 2024-05-04 LAB — POCT URINALYSIS DIP (CLINITEK)
Bilirubin, UA: NEGATIVE
Blood, UA: NEGATIVE
Glucose, UA: NEGATIVE mg/dL
Ketones, POC UA: NEGATIVE mg/dL
Leukocytes, UA: NEGATIVE
Nitrite, UA: NEGATIVE
POC PROTEIN,UA: NEGATIVE
Spec Grav, UA: 1.015 (ref 1.010–1.025)
Urobilinogen, UA: 1 U/dL
pH, UA: 7 (ref 5.0–8.0)

## 2024-05-04 NOTE — Patient Instructions (Signed)
 Prediabetes: What to Know Prediabetes is when your blood sugar, also called glucose, is at a higher level than normal but not high enough for you to be diagnosed with type 2 diabetes (type 2 diabetes mellitus). Having prediabetes puts you at risk for getting type 2 diabetes. By making some healthy changes, you may be able to prevent or delay getting type 2 diabetes. This is important because type 2 diabetes can lead to serious problems. Some of these include: Heart disease. Stroke. Blindness. Kidney disease. Depression. Poor blood flow in the feet and legs. In very bad cases, this could lead to having a leg removed by surgery (amputation). What are the causes? The exact cause of prediabetes isn't known. It may result from insulin resistance. Insulin resistance happens when cells in the body don't respond properly to insulin that the body makes. This can cause too much sugar to build up in the blood. High blood sugar, also called hyperglycemia, can develop. What increases the risk? Having a family member with type 2 diabetes. Being older than 85 years of age. Having had a temporary form of diabetes during a pregnancy. This is called gestational diabetes. Having had polycystic ovary syndrome (PCOS). Being overweight or obese. Being inactive and not getting much exercise. Having a history of heart disease. This may include problems with cholesterol levels, high levels of blood fats, or high blood pressure. What are the signs or symptoms? You may have no symptoms. If you do have symptoms, they may include: Increased hunger. Increased thirst. Needing to pee more often. Changes in how you see, like blurry vision. Feeling tired. How is this diagnosed? Prediabetes can be diagnosed with blood tests that check your blood sugar. One or more of these tests may be done: A fasting blood glucose (FBG) test. You won't be allowed to eat (you will fast) for at least 8 hours before a blood sample is  taken. An A1C blood test, also called a hemoglobin A1C test. This test shows information about blood sugar levels over the past 2?3 months. An oral glucose tolerance test (OGTT). This test measures your blood sugar at two points in time: After you haven't eaten for a while. This is your baseline level. Two hours after you drink a beverage that has sugar in it. You may be diagnosed with prediabetes if: Your FBG is 100?125 mg/dL (1.6-1.0 mmol/L). Your A1C level is 5.7?6.4% (39-46 mmol/mol). Your OGTT result is 140?199 mg/dL (9.6-04 mmol/L). These blood tests may need to be done again to be sure of the diagnosis. How is this treated? Treatment may include making changes to your diet and lifestyle. These changes can help lower your blood sugar and keep you from getting type 2 diabetes. In some cases, medicine may be given to help lower your risk. Follow these instructions at home: Eating and drinking  Eat and drink as told. Follow a healthy meal plan. This includes eating lean proteins, whole grains, legumes, fresh fruits and vegetables, low-fat dairy products, and healthy fats. Meet with an expert in healthy eating called a dietitian. This person can help create a healthy eating plan that's right for you. Lifestyle Do moderate-intensity exercise. Do this for at least 30 minutes a day on 5 or more days each week, or as told by your health care provider. A mix of activities may be best. Good choices include brisk walking, swimming, biking, and weight lifting. Try to lose weight if your provider says it's OK. Losing 5-7% of your body weight can  help reverse insulin resistance. Do not drink alcohol if: Your provider tells you not to drink. You're pregnant, may be pregnant, or plan to become pregnant. If you drink alcohol: Limit how much you have to: 0-1 drink a day if you're female. 0-2 drinks a day if you're female. Know how much alcohol is in your drink. In the [place] one drink is one 12 oz  bottle of beer (355 mL), one 5 oz glass of wine (148 mL), or one 1 oz glass of hard liquor (44 mL). General instructions Take medicines only as told. You may be given medicines that help lower the risk of type 2 diabetes. Do not smoke, vape, or use nicotine or tobacco. Where to find more information American Diabetes Association: diabetes.org/about-diabetes/prediabetes Academy of Nutrition and Dietetics: eatright.org American Heart Association: Go to ThisJobs.cz. Click the search icon. Type "prediabetes" in the search box. Contact a health care provider if: You have any of these symptoms: Increased hunger. Peeing more often than usual. Increased thirst. Feeling tired. Changes in how you see, like blurry vision. Feeling like you may throw up. Throwing up. Get help right away if: You have shortness of breath. You feel confused. This information is not intended to replace advice given to you by your health care provider. Make sure you discuss any questions you have with your health care provider. Document Revised: 01/06/2023 Document Reviewed: 01/06/2023 Elsevier Patient Education  2024 [employer].

## 2024-05-04 NOTE — Progress Notes (Signed)
 I,Kayla Calhoun, CMA,acting as a neurosurgeon for Kayla LOISE Slocumb, MD.,have documented all relevant documentation on the behalf of Kayla LOISE Slocumb, MD,as directed by  Kayla LOISE Slocumb, MD while in the presence of Kayla LOISE Slocumb, MD.  Subjective:  Patient ID: Kayla Calhoun , female    DOB: [birth year] , 83 y.o.   MRN: [id number]  Chief Complaint  Patient presents with   Prediabetes    Patient presents today for a prediabetes and bp check. Patient reports compliance with her meds. Patient denies having chest pain,sob or headaches at this time. Patient would also liked for her urine to be checked for blood.     HPI Discussed the use of AI scribe software for clinical note transcription with the patient, who gave verbal consent to proceed.  History of Present Illness Kayla Calhoun is an 83 year old female who presents for a routine follow-up visit.  She has no current concerns and reports no recent headaches or mood problems, although her mood can be influenced by interactions with her daughter.  Her current medications include amlodipine  5 mg daily, metoprolol , rosuvastatin  10 mg, and a diuretic (half a tablet daily) for blood pressure and cholesterol management. She has increased her water intake to two to three bottles daily.  She denies any issues with snoring or sleep disturbances, though she occasionally experiences a bad night of sleep. She attributes a recent good night's sleep to having 'got it together' after a previous night of poor rest.  She has a history of atrial fibrillation and is scheduled to see a neurologist for further evaluation. No burning or increased frequency of urination, although a previous urine test indicated some blood, prompting a recheck for protein.  She is not currently taking iron supplements but does take vitamins.   Hypertension This is a chronic problem. The current episode started more than 1 year ago. The problem has been gradually improving  since onset. The problem is controlled. Pertinent negatives include no blurred vision, chest pain, palpitations or shortness of breath. Risk factors for coronary artery disease include obesity, sedentary lifestyle and post-menopausal state. Past treatments include calcium  channel blockers, beta blockers and diuretics. The current treatment provides mild improvement. Hypertensive end-organ damage includes kidney disease.     Past Medical History:  Diagnosis Date   Atrial fibrillation (HCC)    Glaucoma    Hypertension    Vitamin D deficiency      Family History  Problem Relation Age of Onset   Stroke Mother    Prostate cancer Father    Stroke Sister    Hypertension Sister    Hypertension Brother    Stroke Brother      Current Outpatient Medications:    amLODipine  (NORVASC ) 5 MG tablet, TAKE 1 TABLET (5 MG TOTAL) BY MOUTH DAILY., Disp: 90 tablet, Rfl: 2   Ascorbic Acid (VITAMIN C) 1000 MG tablet, Take 1,000 mg by mouth every other day. In the morning, Disp: , Rfl:    ASPIRIN  LOW DOSE 81 MG EC tablet, TAKE 1 TABLET (81 MG TOTAL) BY MOUTH DAILY. SWALLOW WHOLE., Disp: 30 tablet, Rfl: 11   cetirizine (ZYRTEC) 10 MG tablet, Take 5 mg by mouth daily as needed for allergies., Disp: , Rfl:    Cholecalciferol (VITAMIN D3) 50 MCG (2000 UT) TABS, Take 2,000 Units by mouth daily., Disp: , Rfl:    levobunolol (BETAGAN) 0.5 % ophthalmic solution, Place 1 drop into both eyes 2 (two) times daily., Disp: , Rfl:  LUMIGAN 0.01 % SOLN, SMARTSIG:1 Drop(s) In Eye(s) Every Evening, Disp: , Rfl:    metoprolol  succinate (TOPROL -XL) 50 MG 24 hr tablet, Take 1 tablet (50 mg total) by mouth daily. Take with or immediately following a meal., Disp: 90 tablet, Rfl: 0   rosuvastatin  (CRESTOR ) 10 MG tablet, TAKE 1 TABLET BY MOUTH EVERY DAY, Disp: 90 tablet, Rfl: 2   triamterene -hydrochlorothiazide  (MAXZIDE -25) 37.5-25 MG tablet, TAKE 1/2 TABLET BY MOUTH DAILY, Disp: 45 tablet, Rfl: 3   vitamin E 180 MG (400 UNITS)  capsule, Take 400 Units by mouth 2 (two) times a week., Disp: , Rfl:    Allergies  Allergen Reactions   Codeine Other (See Comments)   Sulfa Antibiotics Other (See Comments)   Contrast Media [Iodinated Contrast Media] Rash    Skin peeled (head to heel)     Review of Systems  Constitutional: Negative.   Eyes:  Negative for blurred vision.  Respiratory: Negative.  Negative for shortness of breath.   Cardiovascular: Negative.  Negative for chest pain and palpitations.  Gastrointestinal: Negative.   Neurological: Negative.   Psychiatric/Behavioral: Negative.       Today's Vitals   05/04/24 1202  BP: 120/70  Pulse: 94  Temp: 97.8 F (36.6 C)  TempSrc: Oral  Weight: 175 lb (79.4 kg)  Height: 5' 2 (1.575 m)  PainSc: 0-No pain   Body mass index is 32.01 kg/m.  Wt Readings from Last 3 Encounters:  05/04/24 175 lb (79.4 kg)  01/01/24 179 lb (81.2 kg)  01/01/24 179 lb (81.2 kg)    The ASCVD Risk score (Arnett DK, et al., 2019) failed to calculate for the following reasons:   The 2019 ASCVD risk score is only valid for ages 58 to 46  Objective:  Physical Exam Vitals and nursing note reviewed.  Constitutional:      Appearance: Normal appearance.  HENT:     Head: Normocephalic and atraumatic.  Eyes:     Extraocular Movements: Extraocular movements intact.  Cardiovascular:     Rate and Rhythm: Normal rate. Rhythm irregular.     Heart sounds: Murmur heard.  Pulmonary:     Effort: Pulmonary effort is normal.     Breath sounds: Normal breath sounds.  Musculoskeletal:     Cervical back: Normal range of motion.  Skin:    General: Skin is warm.  Neurological:     General: No focal deficit present.     Mental Status: She is alert.  Psychiatric:        Mood and Affect: Mood normal.        Behavior: Behavior normal.         Assessment And Plan:   Assessment & Plan Prediabetes Previous labs reviewed, her A1c has been elevated in the past. I will check an A1c today.  Reminded to avoid refined sugars including sugary drinks/foods and processed meats including bacon, sausages and deli meats.   Hypertensive heart and renal disease with renal failure, stage 1 through stage 4 or unspecified chronic kidney disease, without heart failure Chronic, well controlled. She will continue with amlodipine  5mg  and metoprolol  XL 50mg  daily. Encouraged to follow low sodium diet.  - Follow up in six months. Permanent atrial fibrillation (HCC) Atrial fibrillation with Watchman device.  HR is elevated, she is encouraged to stay well hydrated. On metoprolol  for rate control. - Ensure adequate hydration. Chronic renal disease, stage II Chronic, she is encouraged to stay well hydrated, avoid NSAIDs and keep BP controlled to prevent progression  of CKD.   Class 1 obesity due to excess calories with serious comorbidity and body mass index (BMI) of 32.0 to 32.9 in adult She is encouraged to strive for BMI less than 30 to decrease cardiac risk. Advised to aim for at least 150 minutes of exercise per week.  Influenza vaccination declined   Orders Placed This Encounter  Procedures   CBC   CMP14+EGFR   Hemoglobin A1c   Microalbumin / Creatinine Urine Ratio   POCT URINALYSIS DIP (CLINITEK)     Return in about 4 months (around 09/01/2024) for prediabetes check.  Patient was given opportunity to ask questions. Patient verbalized understanding of the plan and was able to repeat key elements of the plan. All questions were answered to their satisfaction.   I, Kayla LOISE Slocumb, MD, have reviewed all documentation for this visit. The documentation on 05/04/24 for the exam, diagnosis, procedures, and orders are all accurate and complete.   IF YOU HAVE BEEN REFERRED TO A SPECIALIST, IT MAY TAKE 1-2 WEEKS TO SCHEDULE/PROCESS THE REFERRAL. IF YOU HAVE NOT HEARD FROM US /SPECIALIST IN TWO WEEKS, PLEASE GIVE US  A CALL AT [phone].

## 2024-05-04 NOTE — Assessment & Plan Note (Signed)
 Chronic, she is encouraged to stay well hydrated, avoid NSAIDs and keep BP controlled to prevent progression of CKD.

## 2024-05-04 NOTE — Assessment & Plan Note (Signed)
 Previous labs reviewed, her A1c has been elevated in the past. I will check an A1c today. Reminded to avoid refined sugars including sugary drinks/foods and processed meats including bacon, sausages and deli meats.

## 2024-05-04 NOTE — Assessment & Plan Note (Addendum)
 She is encouraged to strive for BMI less than 30 to decrease cardiac risk. Advised to aim for at least 150 minutes of exercise per week.

## 2024-05-04 NOTE — Assessment & Plan Note (Addendum)
 Chronic, well controlled. She will continue with amlodipine  5mg  and metoprolol  XL 50mg  daily. Encouraged to follow low sodium diet.  - Follow up in six months.

## 2024-05-05 ENCOUNTER — Ambulatory Visit: Payer: Self-pay | Admitting: Internal Medicine

## 2024-05-05 LAB — CMP14+EGFR
ALT: 10 IU/L (ref 0–32)
AST: 22 IU/L (ref 0–40)
Albumin: 4.4 g/dL (ref 3.7–4.7)
Alkaline Phosphatase: 64 IU/L (ref 48–129)
BUN/Creatinine Ratio: 20 (ref 12–28)
BUN: 16 mg/dL (ref 8–27)
Bilirubin Total: 0.5 mg/dL (ref 0.0–1.2)
CO2: 25 mmol/L (ref 20–29)
Calcium: 9.8 mg/dL (ref 8.7–10.3)
Chloride: 101 mmol/L (ref 96–106)
Creatinine, Ser: 0.81 mg/dL (ref 0.57–1.00)
Globulin, Total: 2.6 g/dL (ref 1.5–4.5)
Glucose: 87 mg/dL (ref 70–99)
Potassium: 4.4 mmol/L (ref 3.5–5.2)
Sodium: 139 mmol/L (ref 134–144)
Total Protein: 7 g/dL (ref 6.0–8.5)
eGFR: 71 mL/min/1.73 (ref >59)

## 2024-05-05 LAB — CBC
Hematocrit: 37.7 % (ref 34.0–46.6)
Hemoglobin: 12 g/dL (ref 11.1–15.9)
MCH: 28.9 pg (ref 26.6–33.0)
MCHC: 31.8 g/dL (ref 31.5–35.7)
MCV: 91 fL (ref 79–97)
Platelets: 218 x10E3/uL (ref 150–450)
RBC: 4.15 x10E6/uL (ref 3.77–5.28)
RDW: 13.5 % (ref 11.7–15.4)
WBC: 4.6 x10E3/uL (ref 3.4–10.8)

## 2024-05-05 LAB — MICROALBUMIN / CREATININE URINE RATIO
Creatinine, Urine: 28.1 mg/dL
Microalb/Creat Ratio: 11 mg/g{creat} (ref 0–29)
Microalbumin, Urine: 3.2 ug/mL

## 2024-05-05 LAB — HEMOGLOBIN A1C
Est. average glucose Bld gHb Est-mCnc: 120 mg/dL
Hgb A1c MFr Bld: 5.8 % — ABNORMAL HIGH (ref 4.8–5.6)

## 2024-05-10 NOTE — Assessment & Plan Note (Addendum)
 Atrial fibrillation with Watchman device.  HR is elevated, she is encouraged to stay well hydrated. On metoprolol  for rate control. - Ensure adequate hydration.

## 2024-05-18 ENCOUNTER — Encounter: Payer: Self-pay | Admitting: Neurology

## 2024-05-18 ENCOUNTER — Ambulatory Visit: Admitting: Neurology

## 2024-05-18 VITALS — BP 162/82 | HR 90 | Ht 62.0 in | Wt 181.0 lb

## 2024-05-18 DIAGNOSIS — Z9181 History of falling: Secondary | ICD-10-CM

## 2024-05-18 DIAGNOSIS — Z9189 Other specified personal risk factors, not elsewhere classified: Secondary | ICD-10-CM | POA: Diagnosis not present

## 2024-05-18 DIAGNOSIS — I482 Chronic atrial fibrillation, unspecified: Secondary | ICD-10-CM | POA: Diagnosis not present

## 2024-05-18 DIAGNOSIS — R351 Nocturia: Secondary | ICD-10-CM

## 2024-05-18 DIAGNOSIS — I6381 Other cerebral infarction due to occlusion or stenosis of small artery: Secondary | ICD-10-CM

## 2024-05-18 DIAGNOSIS — R413 Other amnesia: Secondary | ICD-10-CM | POA: Diagnosis not present

## 2024-05-18 DIAGNOSIS — Z8679 Personal history of other diseases of the circulatory system: Secondary | ICD-10-CM

## 2024-05-18 NOTE — Progress Notes (Signed)
 Subjective:    Patient ID: Kayla Calhoun is a 83 y.o. female.  HPI    True Mar, MD, PhD [facility] [address] [address] [place], [place] [address]  Dear Dr. Jarold,  I saw your patient, Kayla Calhoun, upon your kind request in my neurologic clinic today for initial consultation of her memory loss.  The patient is accompanied by her daughter Erminio today.  As you know, Ms. Kayla Calhoun is an 83 year old female with an underlying complex medical history of hypertension, lower extremity swelling, arthritis, history of subarachnoid hemorrhage and hydrocephalus (in 2009, resolved), atrial fibrillation, prediabetes, hearing loss, TIA, glaucoma, vitamin D deficiency, and obesity, who reports having forgetfulness such as forgetting why she went to her room.  Daughter reports that she has had memory loss for long-term issues as well including her hospital stay for her subarachnoid hemorrhage.  There is some family history of memory loss affecting 2 of her sisters but they had memory issues in late life.  1 sister recently passed away at age 35.  She had memory issues starting in her late 65s or early 90s even.  2 sisters have sleep apnea.  She has 1 daughter.  She lives alone.  She is widowed for over 29 years, she has 1 grandson and 1 great grandson age 65.  She quit smoking over 40 years ago.  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  Mom and dad did not have memory loss, mom lived to be 31 and dad approximately 93.  She does not drink any alcohol.  She tries to hydrate well with water, estimates that she drinks at least 2-1/2-3 bottles of water per day.  She has fallen unfortunately, she has left knee pain.  She has seen orthopedics for this.  She has 2 canes but does not typically use them.  She fell recently and landed on her right knee but does not have any pain from it. Of note, her blood pressure is elevated  today, also upon recheck.  She reports that she has not taken her blood pressure medication yet.  She is on amlodipine , metoprolol , and Maxide.  I reviewed your office note from 01/01/2024.  She had a recent carotid Doppler ultrasound on 01/03/2024 and I reviewed the results: Velocities in the right ICA are consistent with 1 to 3% stenosis.  Velocities in the left ICA are consistent with a 1 to 39% stenosis.  Bilateral vertebral arteries demonstrate antegrade flow.  Normal flow hemodynamics were seen in bilateral subclavian arteries.  She had an echocardiogram on 08/19/2023 and I reviewed the results: LVEF was 60 to 65%.  She had grade 3 diastolic dysfunction.  She had severe left atrial dilatation and moderate dilatation of right atrium.  She had mild mitral valve regurgitation, no evidence of mitral valve stenosis, she had moderate aortic regurgitation, no aortic stenosis. She had a brain MRI without contrast on 12/11/2023 and I reviewed the results:   IMPRESSION: 1. No evidence of an acute intracranial abnormality. 2. Mild-to-moderate chronic small vessel ischemic changes within the cerebral white matter. 3. Small chronic right thalamic lacunar infarct. 4. Mild-to-moderate generalized cerebral atrophy. 5. 12 mm left maxillary sinus mucous retention cyst.     In addition, I personally and independently reviewed images through the PACS system.  Her Epworth sleepiness score is 4 out of 24.  Bedtime is generally between 10 and 10:30 PM and rise time between 4:30  AM and 5:30 AM.  She drinks limited caffeine, 1 cup of coffee per day. She has not had a sleep study. She is not sure if she snores.  She has nocturia once per average night, denies recurrent morning headaches.    She is followed by cardiology.  She is on a beta-blocker and low-dose aspirin , not on a blood thinner.  She had recent blood work in the past 3 months.  I reviewed blood test results from 09/04/2023 as well as 01/01/2024 and 05/04/2024.   Her latest A1c was 5.8 in November 2025.  B12 level in March 2025 was 811.  TSH at that time was normal at 2.1.  Lipid panel in July 2025 was benign.  She is on Crestor . She has a remote history of smoking and stopped smoking over 35 years ago.  Her Past Medical History Is Significant For: Past Medical History:  Diagnosis Date   Atrial fibrillation (HCC)    Glaucoma    Hypertension    Vitamin D deficiency     Her Past Surgical History Is Significant For: Past Surgical History:  Procedure Laterality Date   ABDOMINAL HYSTERECTOMY     CATARACT EXTRACTION Bilateral    Dr. Patrcia 9/27   KIDNEY SURGERY     LEFT ATRIAL APPENDAGE OCCLUSION N/A 12/22/2020   Procedure: LEFT ATRIAL APPENDAGE OCCLUSION;  Surgeon: Cindie Ole DASEN, MD;  Location: [facility] INVASIVE CV LAB;  Service: Cardiovascular;  Laterality: N/A;   TEE WITHOUT CARDIOVERSION N/A 12/22/2020   Procedure: TRANSESOPHAGEAL ECHOCARDIOGRAM (TEE);  Surgeon: Cindie Ole DASEN, MD;  Location[facility] INVASIVE CV LAB;  Service: Cardiovascular;  Laterality: N/A;   TEE WITHOUT CARDIOVERSION N/A 02/01/2021   Procedure: TRANSESOPHAGEAL ECHOCARDIOGRAM (TEE);  Surgeon: Francyne Headland, MD;  Location: [facility] ENDOSCOPY;  Service: Cardiovascular;  Laterality: N/A;   TONSILLECTOMY      Her Family History Is Significant For: Family History  Problem Relation Age of Onset   Stroke Mother    Prostate cancer Father    Stroke Sister    Hypertension Sister    Dementia Sister    Hypertension Brother    Stroke Brother     Her Social History Is Significant For: Social History   Socioeconomic History   Marital status: Widowed    Spouse name: Not on file   Number of children: 1   Years of education: Not on file   Highest education level: Not on file  Occupational History   Occupation: retired  Tobacco Use   Smoking status: Former    Types: Cigarettes   Smokeless tobacco: Never   Tobacco comments:    been quit 35 years  Vaping Use   Vaping status:  Never Used  Substance and Sexual Activity   Alcohol use: Not Currently   Drug use: Never   Sexual activity: Not Currently  Other Topics Concern   Not on file  Social History Narrative   Pt lives alone    Retired    Social Drivers of Corporate Investment Banker Strain: Low Risk  (01/01/2024)   Overall Financial Resource Strain (CARDIA)    Difficulty of Paying Living Expenses: Not hard at all  Food Insecurity: No Food Insecurity (01/01/2024)   Hunger Vital Sign    Worried About Running Out of Food in the Last Year: Never true    Ran Out of Food in the Last Year: Never true  Transportation Needs: No Transportation Needs (01/01/2024)   PRAPARE - Administrator, Civil Service (Medical):  No    Lack of Transportation (Non-Medical): No  Physical Activity: Inactive (01/01/2024)   Exercise Vital Sign    Days of Exercise per Week: 0 days    Minutes of Exercise per Session: 0 min  Stress: No Stress Concern Present (01/01/2024)   [employer] of Occupational Health - Occupational Stress Questionnaire    Feeling of Stress: Not at all  Social Connections: Moderately Isolated (01/01/2024)   Social Connection and Isolation Panel    Frequency of Communication with Friends and Family: More than three times a week    Frequency of Social Gatherings with Friends and Family: Twice a week    Attends Religious Services: More than 4 times per year    Active Member of [employer] or Organizations: No    Attends Banker Meetings: Never    Marital Status: Widowed    Her Allergies Are:  Allergies  Allergen Reactions   Codeine Other (See Comments)   Sulfa Antibiotics Other (See Comments)   Contrast Media [Iodinated Contrast Media] Rash    Skin peeled (head to heel)  :   Her Current Medications Are:  Outpatient Encounter Medications as of 05/18/2024  Medication Sig   amLODipine  (NORVASC ) 5 MG tablet TAKE 1 TABLET (5 MG TOTAL) BY MOUTH DAILY.   Ascorbic Acid (VITAMIN C) 1000 MG  tablet Take 1,000 mg by mouth every other day. In the morning   ASPIRIN  LOW DOSE 81 MG EC tablet TAKE 1 TABLET (81 MG TOTAL) BY MOUTH DAILY. SWALLOW WHOLE.   cetirizine (ZYRTEC) 10 MG tablet Take 5 mg by mouth daily as needed for allergies.   Cholecalciferol (VITAMIN D3) 50 MCG (2000 UT) TABS Take 2,000 Units by mouth daily.   levobunolol (BETAGAN) 0.5 % ophthalmic solution Place 1 drop into both eyes 2 (two) times daily.   LUMIGAN 0.01 % SOLN SMARTSIG:1 Drop(s) In Eye(s) Every Evening   metoprolol  succinate (TOPROL -XL) 50 MG 24 hr tablet Take 1 tablet (50 mg total) by mouth daily. Take with or immediately following a meal.   rosuvastatin  (CRESTOR ) 10 MG tablet TAKE 1 TABLET BY MOUTH EVERY DAY   triamterene -hydrochlorothiazide  (MAXZIDE -25) 37.5-25 MG tablet TAKE 1/2 TABLET BY MOUTH DAILY   vitamin E 180 MG (400 UNITS) capsule Take 400 Units by mouth 2 (two) times a week.   No facility-administered encounter medications on file as of 05/18/2024.  :   Review of Systems:  Out of a complete 14 point review of systems, all are reviewed and negative with the exception of these symptoms as listed below:  Review of Systems  Objective:  Neurological Exam  Physical Exam Physical Examination:   Vitals:   05/18/24 0748 05/18/24 0759  BP: (!) 159/85 (!) 162/82  Pulse: 90     General Examination: The patient is a very pleasant 83 y.o. female in no acute distress. She appears well-developed and well-nourished and well groomed.  No symptoms from elevated blood pressure values, denies any chest pain, shortness of breath, head pressure or blurry vision.  HEENT: Normocephalic, atraumatic, pupils are equal, round and reactive to light, extraocular tracking is good without limitation to gaze excursion or nystagmus noted. No photophobia.  Status post cataract surgeries. Hearing is grossly intact.  Face is symmetric with normal facial animation. Speech is clear without dysarthria. There is no hypophonia.  There is no lip, neck/head, jaw or voice tremor. Neck is supple with full range of passive and active motion. There are no carotid bruits on auscultation.  Airway/Oropharynx exam  reveals: mild mouth dryness, adequate dental hygiene and full dentures in place.  Mild airway crowding secondary to small airway entry and redundant soft palate, Mallampati class II, tonsils absent.  Neck circumference 13 inches.  Tongue protrudes centrally and palate elevates symmetrically.   Chest: Clear to auscultation without wheezing, rhonchi or crackles noted.  Heart: S1+S2+0, irregularly irregular.    Abdomen: Soft, non-tender and non-distended.  Extremities: There is significant non-pitting edema in the distal lower extremities bilaterally.   Skin: Warm and dry without trophic changes noted.   Musculoskeletal: exam reveals mild knee swelling bilaterally.    Neurologically:  Mental status: The patient is awake, alert and oriented in all 4 spheres. Her immediate and remote memory, attention, language skills and fund of knowledge are appropriate. There is no evidence of aphasia, agnosia, apraxia or anomia. Speech is clear with normal prosody and enunciation. Thought process is linear. Mood is normal and affect is normal.     05/18/2024    7:50 AM  MMSE - Mini Mental State Exam  Orientation to time 5  Orientation to Place 4  Registration 3  Attention/ Calculation 1  Recall 3  Language- name 2 objects 2  Language- repeat 1  Language- follow 3 step command 3  Language- read & follow direction 1  Write a sentence 1  Copy design 1  Total score 25   On 05/18/2024: CDT: 3/4, AFT: 9/min.  Cranial nerves II - XII are as described above under HEENT exam.  Motor exam: Normal bulk, strength and tone is noted. There is no obvious action or resting tremor.  Fine motor skills and coordination: Intact grossly.  Cerebellar testing: No dysmetria or intention tremor. There is no truncal or gait ataxia.  Sensory exam:  intact to light touch in the upper and lower extremities.  Gait, station and balance: She stands easily. No veering to one side is noted. No leaning to one side is noted. Posture is age-appropriate to mildly stooped forward in the lower back and stance is somewhat wider based.  Gait shows very slight limp on the left.  She has no walking aid.    Assessment and Plan:   In summary, SUDIKSHA VICTOR is a very pleasant 83 year old female with an underlying complex medical history of hypertension, lower extremity swelling, arthritis, history of subarachnoid hemorrhage and hydrocephalus (in 2009, resolved), atrial fibrillation, prediabetes, hearing loss, TIA, glaucoma, vitamin D deficiency, and obesity, who presents for evaluation of her memory loss, mostly short-term memory issues with a mildly abnormal MMSE today.  She has multiple vascular risk factors and not a very strong family history of memory loss, some late onset memory loss in 2 of her 8 siblings.  We talked about her recent test results including her carotid Doppler study, MRI, today's memory scores, and vascular risk factors, prior history of subarachnoid hemorrhage as well.   I had a long chat with the patient and her daughter about my findings and her symptoms.  Her risk factors were discussed as well and risk factor modification and secondary prevention was discussed.  At this juncture, I would like to proceed with a sleep study.  We can certainly consider a memory medication down the road but neither the patient nor I are very keen on putting her on dementia medication quite yet.  Lacunar stroke was discussed as well and her fall risk was also addressed.  This was an extended visit of over 60 minutes with copious record review, including imaging test review  and memory test result review, considerable counseling and coordination of care and addressing multiple issues.   Below is a summary of my recommendations and our discussion points from  today's visit, based on chart review, history and examination. They were given these instructions verbally during the visit in detail and also in writing in the MyChart after visit summary (AVS), which they can access electronically. <<  You have had recent blood work and a recent brain MRI and we do not need to repeat blood testing or a scan at this time.  We may consider medication for memory loss down the road.   I recommend we proceed with a sleep study to rule out obstructive sleep apnea.  If you have obstructive sleep apnea I will likely recommend treatment with a CPAP or AutoPap machine. Please use your cane for gait safety. Monitor your driving and limit your driving to familiar routes and nonhighway roads and daylight driving. Try to increase your water intake to about 3-4 bottles of water per day, 16.9 ounce size each.  >>    Thank you very much for allowing me to participate in the care of this nice patient. If I can be of any further assistance to you please do not hesitate to call me at [phone].  Sincerely,   True Mar, MD, PhD

## 2024-05-18 NOTE — Patient Instructions (Signed)
 It was nice to meet you today.  You have complaints of memory loss: memory loss or changes in cognitive function can have many reasons and does not always mean you have dementia.  There are several conditions and situations that can contribute to subjective or objective memory loss.  These factors include: depression, stress, sleep deprivation or poor sleep from insomnia or sleep apnea, dehydration, fluctuation in blood sugar values, thyroid  or electrolyte dysfunction, medication effects from sedating medications or narcotic pain medication for example and certain vitamin deficiencies such as vitamin B12 deficiency, and anemia. Dementia can be caused by stroke, brain atherosclerosis or brain vascular disease due to vascular risk factors (smoking, high blood pressure, high cholesterol, obesity and uncontrolled diabetes), certain degenerative brain disorders (including Parkinson's disease and Multiple sclerosis) and by Alzheimer's disease or other, more rare and sometimes hereditary causes.   Here is what I would recommend:   You have had recent blood work and a recent brain MRI and we do not need to repeat blood testing or a scan at this time.  We may consider medication for memory loss down the road.   I recommend we proceed with a sleep study to rule out obstructive sleep apnea.  If you have obstructive sleep apnea I will likely recommend treatment with a CPAP or AutoPap machine. Please use your cane for gait safety. Monitor your driving and limit your driving to familiar routes and nonhighway roads and daylight driving. Try to increase your water intake to about 3-4 bottles of water per day, 16.9 ounce size each.

## 2024-05-28 ENCOUNTER — Other Ambulatory Visit: Payer: Self-pay | Admitting: Cardiology

## 2024-06-06 ENCOUNTER — Other Ambulatory Visit: Payer: Self-pay | Admitting: Cardiology

## 2024-07-01 ENCOUNTER — Encounter: Payer: Self-pay | Admitting: Cardiology

## 2024-07-01 ENCOUNTER — Ambulatory Visit (INDEPENDENT_AMBULATORY_CARE_PROVIDER_SITE_OTHER): Admitting: Neurology

## 2024-07-01 ENCOUNTER — Ambulatory Visit: Attending: Cardiology | Admitting: Cardiology

## 2024-07-01 VITALS — BP 116/78 | HR 100 | Ht 62.0 in | Wt 181.0 lb

## 2024-07-01 DIAGNOSIS — G4733 Obstructive sleep apnea (adult) (pediatric): Secondary | ICD-10-CM

## 2024-07-01 DIAGNOSIS — I340 Nonrheumatic mitral (valve) insufficiency: Secondary | ICD-10-CM | POA: Diagnosis not present

## 2024-07-01 DIAGNOSIS — I4821 Permanent atrial fibrillation: Secondary | ICD-10-CM

## 2024-07-01 DIAGNOSIS — Z8679 Personal history of other diseases of the circulatory system: Secondary | ICD-10-CM | POA: Diagnosis not present

## 2024-07-01 DIAGNOSIS — Z8673 Personal history of transient ischemic attack (TIA), and cerebral infarction without residual deficits: Secondary | ICD-10-CM | POA: Diagnosis not present

## 2024-07-01 DIAGNOSIS — Z9189 Other specified personal risk factors, not elsewhere classified: Secondary | ICD-10-CM

## 2024-07-01 DIAGNOSIS — I2583 Coronary atherosclerosis due to lipid rich plaque: Secondary | ICD-10-CM

## 2024-07-01 DIAGNOSIS — I6381 Other cerebral infarction due to occlusion or stenosis of small artery: Secondary | ICD-10-CM

## 2024-07-01 DIAGNOSIS — R351 Nocturia: Secondary | ICD-10-CM

## 2024-07-01 DIAGNOSIS — R413 Other amnesia: Secondary | ICD-10-CM

## 2024-07-01 DIAGNOSIS — G4734 Idiopathic sleep related nonobstructive alveolar hypoventilation: Secondary | ICD-10-CM

## 2024-07-01 DIAGNOSIS — I482 Chronic atrial fibrillation, unspecified: Secondary | ICD-10-CM

## 2024-07-01 DIAGNOSIS — Z9181 History of falling: Secondary | ICD-10-CM

## 2024-07-01 NOTE — Progress Notes (Signed)
 " Cardiology Office Note:  .   Date:  07/01/2024  ID:  Kayla Calhoun, DOB 1941/03/15, MRN [id number] PCP: Jarold Medici, MD  [facility] Providers Cardiologist:  Oneil Parchment, MD Electrophysiologist:  OLE ONEIDA HOLTS, MD (Inactive)     History of Present Illness: .   Kayla Calhoun is a 84 y.o. female Discussed the use of AI scribe   History of Present Illness Kayla Calhoun is an 84 year old female with longstanding persistent atrial fibrillation who presents for follow-up.  She has longstanding persistent atrial fibrillation and had a Watchman device implanted to prevent the need for long-term anticoagulation due to a previous subarachnoid bleed.  She experiences occasional stinging chest pain, described as a 'little sting' occurring intermittently. This symptom has been present in the past as well.  Her blood pressure is well controlled with metoprolol , Maxzide , and amlodipine  5 mg daily. She is also on low-dose aspirin , 81 mg daily, and Crestor  10 mg daily for cholesterol management.  She has a coronary calcium  score of 692, placing her in the ninetieth percentile, and has mild to moderate mitral valve regurgitation, mild aortic valve stenosis, and a small chronic pericardial effusion.  She maintains a healthy diet, consuming greens, vegetables, and beans regularly, and limits her intake of fatty foods.      Studies Reviewed: SABRA   EKG Interpretation Date/Time:  Wednesday July 01 2024 08:41:42 EST Ventricular Rate:  100 PR Interval:    QRS Duration:  64 QT Interval:  338 QTC Calculation: 436 R Axis:   15  Text Interpretation: Atrial fibrillation Septal infarct (cited on or before 20-Dec-2020) When compared with ECG of 25-Mar-2023 08:23, No significant change was found Confirmed by Parchment Oneil ([id number]) on 07/01/2024 8:56:43 AM    Results Radiology Coronary artery calcium  score: Score 692, ninetieth percentile  Diagnostic EKG (07/01/2024): Atrial  fibrillation with good rate control (Independently interpreted) Echocardiogram: Normal left ventricular systolic function, mild to moderate mitral regurgitation, mild aortic stenosis, small chronic pericardial effusion Risk Assessment/Calculations:            Physical Exam:   VS:  BP 116/78   Pulse 100   Ht 5' 2 (1.575 m)   Wt 181 lb (82.1 kg)   SpO2 98%   BMI 33.11 kg/m    Wt Readings from Last 3 Encounters:  07/01/24 181 lb (82.1 kg)  05/18/24 181 lb (82.1 kg)  05/04/24 175 lb (79.4 kg)    GEN: Well nourished, well developed in no acute distress NECK: No JVD; No carotid bruits CARDIAC: IRREG IRREG, no murmurs, no rubs, no gallops RESPIRATORY:  Clear to auscultation without rales, wheezing or rhonchi  ABDOMEN: Soft, non-tender, non-distended EXTREMITIES:  No edema; No deformity   ASSESSMENT AND PLAN: .    Assessment and Plan Assessment & Plan Permanent atrial fibrillation status post Watchman device implantation Longstanding persistent atrial fibrillation with good rate control on metoprolol . Watchman device in place to prevent the need for anticoagulation due to remote subarachnoid hemorrhage. No new symptoms reported. - Continue metoprolol  succinate 50 mg oral daily - Continue low dose aspirin  81 mg daily  Coronary artery disease with elevated coronary calcium  score Coronary calcium  score of 692, placing her in the 90th percentile. Occasional stinging chest pain likely due to muscle clenching. On Crestor  for cholesterol management. - Continue Crestor  10 mg oral daily  Mitral regurgitation and aortic stenosis Mild to moderate mitral valve regurgitation and mild aortic valve stenosis. No new symptoms reported. -  Continue current management  Hypertension Blood pressure well controlled with metoprolol , Maxzide , and amlodipine . 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 No new symptoms reported. - No need for ECHO today  Getting sleep apnea study tonight.       Dispo: 1 yr  Signed, Oneil Parchment, MD  "

## 2024-07-01 NOTE — Patient Instructions (Signed)
 Medication Instructions:   Your physician recommends that you continue on your current medications as directed. Please refer to the Current Medication list given to you today.   *If you need a refill on your cardiac medications before your next appointment, please call your pharmacy*    Lab Work: NONE ORDERED  TODAY    If you have labs (blood work) drawn today and your tests are completely normal, you will receive your results only by: MyChart Message (if you have MyChart) OR A paper copy in the mail If you have any lab test that is abnormal or we need to change your treatment, we will call you to review the results.     Testing/Procedures:  NONE ORDERED  TODAY      Follow-Up: At [facility], you and your health needs are our priority.  As part of our continuing mission to provide you with exceptional heart care, our providers are all part of one team.  This team includes your primary Cardiologist (physician) and Advanced Practice Providers or APPs (Physician Assistants and Nurse Practitioners) who all work together to provide you with the care you need, when you need it.  Your next appointment:    1 year(s)  Provider:   Oneil Parchment, MD    We recommend signing up for the patient portal called MyChart.  Sign up information is provided on this After Visit Summary.  MyChart is used to connect with patients for Virtual Visits (Telemedicine).  Patients are able to view lab/test results, encounter notes, upcoming appointments, etc.  Non-urgent messages can be sent to your provider as well.   To learn more about what you can do with MyChart, go to [email or web address].   Other Instructions

## 2024-07-02 NOTE — Progress Notes (Signed)
 See procedure note.

## 2024-07-06 ENCOUNTER — Encounter: Payer: Self-pay | Admitting: Neurology

## 2024-07-06 ENCOUNTER — Ambulatory Visit: Payer: Self-pay | Admitting: Neurology

## 2024-07-06 DIAGNOSIS — G4733 Obstructive sleep apnea (adult) (pediatric): Secondary | ICD-10-CM

## 2024-07-06 DIAGNOSIS — G4734 Idiopathic sleep related nonobstructive alveolar hypoventilation: Secondary | ICD-10-CM

## 2024-07-06 NOTE — Procedures (Signed)
 "   [facility]  HOME SLEEP TEST (Watch PAT) REPORT  STUDY DATE: 07/01/2024  DOB: 19-Jul-1940  MRN: [id number]  ORDERING CLINICIAN: True Mar, MD, PhD   REFERRING CLINICIAN: Jarold Medici, MD   CLINICAL INFORMATION/HISTORY (obtained from visit note dated 05/18/2024): 84 year old female with an underlying complex medical history of hypertension, lower extremity swelling, arthritis, history of subarachnoid hemorrhage and hydrocephalus (in 2009, resolved), atrial fibrillation, prediabetes, hearing loss, TIA, glaucoma, vitamin D deficiency, and obesity, who reports forgetfulness.  BMI: 33.1 kg/m  FINDINGS:   Sleep Summary:   Total Recording Time (hours, min): 6 hours, 37 min  Total Sleep Time (hours, min):  6 hours, 9 min  Percent REM (%):    18.7%   Respiratory Indices:   Calculated pAHI (per hour):  25.5/hour         REM pAHI:    44.4/hour       NREM pAHI: 22.6/hour  Central pAHI: 4.4/hour  Oxygen Saturation Statistics:    Oxygen Saturation (%) Mean: 91%   Minimum oxygen saturation (%):                 79%   O2 Saturation Range (%): 79-97%    O2 Saturation (minutes) <=88%: 10.8 min  Pulse Rate Statistics:   Pulse Mean (bpm):    89/min    Pulse Range (64-131/min)   IMPRESSION:   OSA (obstructive sleep apnea), moderate Nocturnal Hypoxemia  RECOMMENDATION:  This home sleep test demonstrates moderate obstructive sleep apnea with a total AHI of 25.5/hour and O2 nadir of 79% with time below or at 88% saturation of over 10 minutes for the night, indicating some degree of nocturnal hypoxemia.  Variable snoring was detected, ranging from mild to louder. Treatment with a positive airway pressure (PAP) device is recommended. The patient will be advised to proceed with an autoPAP titration/trial at home for now. A full night titration study may be considered to optimize treatment settings, monitor proper oxygen saturations and aid with improvement of  tolerance and adherence, if needed down the road. Alternative treatment options may include a dental device through dentistry or orthodontics in selected patients or Inspire (hypoglossal nerve stimulator) in carefully selected patients (meeting inclusion criteria).  Concomitant weight loss is recommended (where clinically appropriate). Please note that untreated obstructive sleep apnea may carry additional perioperative morbidity. Patients with significant obstructive sleep apnea should receive perioperative PAP therapy and the surgeons and particularly the anesthesiologist should be informed of the diagnosis and the severity of the sleep disordered breathing. The patient should be cautioned not to drive, work at heights, or operate dangerous or heavy equipment when tired or sleepy. Review and reiteration of good sleep hygiene measures should be pursued with any patient. Other causes of the patient's symptoms, including circadian rhythm disturbances, an underlying mood disorder, medication effect and/or an underlying medical problem cannot be ruled out based on this test. Clinical correlation is recommended.  The patient and her referring provider will be notified of the test results. The patient will be seen in follow up in sleep clinic at [facility].  I certify that I have reviewed the raw data recording prior to the issuance of this report in accordance with the standards of the American Academy of Sleep Medicine (AASM).    INTERPRETING PHYSICIAN:   True Mar, MD, PhD Medical Director, [facility]) Diplomat, ABPN (Neurology and Sleep)   [facility] [address] [place], [place] [address] [phone]                               "

## 2024-07-06 NOTE — Progress Notes (Signed)
 See procedure note.

## 2024-07-09 NOTE — Telephone Encounter (Signed)
 Spoke to daughter (checked DPR ) gave patient sleep study results . Daughter states will discuss with patient and call back to inform us  how patient wants to move forward

## 2024-07-09 NOTE — Telephone Encounter (Signed)
-----   Message from True Mar, MD sent at 07/06/2024  8:31 AM EST ----- Patient referred by PCP for memory loss, seen by me on 05/18/2024, patient had a HST on 07/01/2024.    Please call and notify the patient that the recent home sleep test showed obstructive sleep apnea in the moderate range. I recommend treatment in the form of autoPAP, which means, that we don't have  to bring her in for a sleep study with CPAP, but will let her start using a so called autoPAP machine at home, which is a CPAP-like machine with self-adjusting pressures. We will send the order to a  local DME company (of her choice, or as per insurance requirement). The DME representative will fit her with a mask, educate her on how to use the machine, how to put the mask on, etc. I have placed  an order in the chart. Please send the order, talk to patient, send report to referring MD. We will need a FU in sleep clinic for 10 weeks post-PAP set up, please arrange that with me or one of our  NPs. Also reinforce the need for compliance with treatment. Thanks,   True Mar, MD, PhD [facility])

## 2024-07-09 NOTE — Telephone Encounter (Signed)
 Please review result note.

## 2024-07-09 NOTE — Telephone Encounter (Signed)
-----   Message from True Mar, MD sent at 07/06/2024  8:31 AM EST ----- Patient referred by PCP for memory loss, seen by me on 05/18/2024, patient had a HST on 07/01/2024.    Please call and notify the patient that the recent home sleep test showed obstructive sleep apnea in the moderate range. I recommend treatment in the form of autoPAP, which means, that we don't have  to bring her in for a sleep study with CPAP, but will let her start using a so called autoPAP machine at home, which is a CPAP-like machine with self-adjusting pressures. We will send the order to a  local DME company (of her choice, or as per insurance requirement). The DME representative will fit her with a mask, educate her on how to use the machine, how to put the mask on, etc. I have placed  an order in the chart. Please send the order, talk to patient, send report to referring MD. We will need a FU in sleep clinic for 10 weeks post-PAP set up, please arrange that with me or one of our  NPs. Also reinforce the need for compliance with treatment. Thanks,   True Mar, MD, PhD [facility])

## 2024-07-09 NOTE — Telephone Encounter (Signed)
 Spoke to patient and daughter on speaker phone . Pt requested f/u visit with Dr Buck to discuss other forms of treatment for sleep apnea . Made f/u visit 07/2024 with Dr Buck .Did advise daughter and patient untreated sleep apnea puts patient in higher risk of heart attack ,stroke,dementia. Daughter and patient states ok . Daughter thanked me for calling .

## 2024-07-09 NOTE — Telephone Encounter (Signed)
 Patient's daughter return phone call, would like a call back.

## 2024-08-04 ENCOUNTER — Ambulatory Visit: Admitting: Neurology

## 2024-09-14 ENCOUNTER — Ambulatory Visit: Admitting: Internal Medicine

## 2025-02-10 ENCOUNTER — Ambulatory Visit: Payer: Self-pay

## 2025-02-10 ENCOUNTER — Ambulatory Visit: Payer: Self-pay | Admitting: Internal Medicine
